# Patient Record
Sex: Male | Born: 1950 | Race: White | Hispanic: No | Marital: Married | Smoking: Never smoker
Health system: Southern US, Community
[De-identification: ages and names within clinical notes are randomized; demographics above are authoritative.]

## PROBLEM LIST (undated history)

## (undated) DIAGNOSIS — I10 Essential (primary) hypertension: Secondary | ICD-10-CM

## (undated) DIAGNOSIS — K7469 Other cirrhosis of liver: Secondary | ICD-10-CM

## (undated) DIAGNOSIS — R7303 Prediabetes: Secondary | ICD-10-CM

## (undated) DIAGNOSIS — F329 Major depressive disorder, single episode, unspecified: Secondary | ICD-10-CM

## (undated) DIAGNOSIS — D649 Anemia, unspecified: Secondary | ICD-10-CM

## (undated) DIAGNOSIS — M1990 Unspecified osteoarthritis, unspecified site: Secondary | ICD-10-CM

## (undated) DIAGNOSIS — K7460 Unspecified cirrhosis of liver: Secondary | ICD-10-CM

## (undated) DIAGNOSIS — F32A Depression, unspecified: Secondary | ICD-10-CM

## (undated) HISTORY — PX: CHOLECYSTECTOMY: SHX55

## (undated) HISTORY — PX: TRIGGER FINGER RELEASE: SHX641

## (undated) HISTORY — PX: COLON SURGERY: SHX602

## (undated) SURGERY — ARTHROSCOPY, SHOULDER
Anesthesia: Choice | Laterality: Left

---

## 1998-05-20 ENCOUNTER — Encounter: Admission: RE | Admit: 1998-05-20 | Discharge: 1998-07-05 | Payer: Self-pay | Admitting: Orthopedic Surgery

## 2001-03-27 HISTORY — PX: COLON SURGERY: SHX602

## 2002-01-07 ENCOUNTER — Encounter (INDEPENDENT_AMBULATORY_CARE_PROVIDER_SITE_OTHER): Payer: Self-pay | Admitting: Specialist

## 2002-01-07 ENCOUNTER — Inpatient Hospital Stay (HOSPITAL_COMMUNITY): Admission: RE | Admit: 2002-01-07 | Discharge: 2002-01-12 | Payer: Self-pay | Admitting: *Deleted

## 2003-01-23 ENCOUNTER — Ambulatory Visit (HOSPITAL_COMMUNITY): Admission: RE | Admit: 2003-01-23 | Discharge: 2003-01-23 | Payer: Self-pay | Admitting: Orthopedic Surgery

## 2003-01-23 ENCOUNTER — Ambulatory Visit (HOSPITAL_BASED_OUTPATIENT_CLINIC_OR_DEPARTMENT_OTHER): Admission: RE | Admit: 2003-01-23 | Discharge: 2003-01-23 | Payer: Self-pay | Admitting: Orthopedic Surgery

## 2003-03-28 HISTORY — PX: CARPAL TUNNEL RELEASE: SHX101

## 2006-01-05 ENCOUNTER — Ambulatory Visit (HOSPITAL_BASED_OUTPATIENT_CLINIC_OR_DEPARTMENT_OTHER): Admission: RE | Admit: 2006-01-05 | Discharge: 2006-01-05 | Payer: Self-pay | Admitting: Orthopedic Surgery

## 2009-04-07 ENCOUNTER — Ambulatory Visit (HOSPITAL_BASED_OUTPATIENT_CLINIC_OR_DEPARTMENT_OTHER): Admission: RE | Admit: 2009-04-07 | Discharge: 2009-04-07 | Payer: Self-pay | Admitting: Orthopedic Surgery

## 2010-03-27 HISTORY — PX: JOINT REPLACEMENT: SHX530

## 2010-03-30 LAB — BASIC METABOLIC PANEL
BUN: 15 mg/dL (ref 6–23)
CO2: 31 mEq/L (ref 19–32)
Calcium: 9.9 mg/dL (ref 8.4–10.5)
Chloride: 103 mEq/L (ref 96–112)
Creatinine, Ser: 1.19 mg/dL (ref 0.4–1.5)
GFR calc Af Amer: >60 mL/min (ref >60)
GFR calc non Af Amer: >60 mL/min (ref >60)
Glucose, Bld: 104 mg/dL — ABNORMAL HIGH (ref 70–99)
Potassium: 4.2 mEq/L (ref 3.5–5.1)
Sodium: 142 mEq/L (ref 135–145)

## 2010-03-30 LAB — CBC
HCT: 39.3 % (ref 39.0–52.0)
Hemoglobin: 12.9 g/dL — ABNORMAL LOW (ref 13.0–17.0)
MCH: 31.5 pg (ref 26.0–34.0)
MCHC: 32.8 g/dL (ref 30.0–36.0)
MCV: 96.1 fL (ref 78.0–100.0)
Platelets: 310 10*3/uL (ref 150–400)
RBC: 4.09 MIL/uL — ABNORMAL LOW (ref 4.22–5.81)
RDW: 15.3 % (ref 11.5–15.5)
WBC: 6.2 10*3/uL (ref 4.0–10.5)

## 2010-03-30 LAB — ABO/RH: ABO/RH(D): O NEG

## 2010-04-01 ENCOUNTER — Inpatient Hospital Stay (HOSPITAL_COMMUNITY)
Admission: RE | Admit: 2010-04-01 | Discharge: 2010-04-04 | Payer: Self-pay | Source: Home / Self Care | Attending: Orthopedic Surgery | Admitting: Orthopedic Surgery

## 2010-04-01 LAB — COMPREHENSIVE METABOLIC PANEL
ALT: 20 U/L (ref 0–53)
AST: 21 U/L (ref 0–37)
Albumin: 3.9 g/dL (ref 3.5–5.2)
Alkaline Phosphatase: 64 U/L (ref 39–117)
BUN: 18 mg/dL (ref 6–23)
CO2: 29 mEq/L (ref 19–32)
Calcium: 10 mg/dL (ref 8.4–10.5)
Chloride: 103 mEq/L (ref 96–112)
Creatinine, Ser: 1.14 mg/dL (ref 0.4–1.5)
GFR calc Af Amer: >60 mL/min (ref >60)
GFR calc non Af Amer: >60 mL/min (ref >60)
Glucose, Bld: 102 mg/dL — ABNORMAL HIGH (ref 70–99)
Potassium: 4.5 mEq/L (ref 3.5–5.1)
Sodium: 144 mEq/L (ref 135–145)
Total Bilirubin: 1 mg/dL (ref 0.3–1.2)
Total Protein: 6.7 g/dL (ref 6.0–8.3)

## 2010-04-01 LAB — CBC
HCT: 38.3 % — ABNORMAL LOW (ref 39.0–52.0)
HCT: 31.7 % — ABNORMAL LOW (ref 39.0–52.0)
Hemoglobin: 12.4 g/dL — ABNORMAL LOW (ref 13.0–17.0)
Hemoglobin: 10.2 g/dL — ABNORMAL LOW (ref 13.0–17.0)
MCH: 31.3 pg (ref 26.0–34.0)
MCH: 31.4 pg (ref 26.0–34.0)
MCHC: 32.4 g/dL (ref 30.0–36.0)
MCHC: 32.2 g/dL (ref 30.0–36.0)
MCV: 96.7 fL (ref 78.0–100.0)
MCV: 97.5 fL (ref 78.0–100.0)
Platelets: 326 10*3/uL (ref 150–400)
Platelets: 279 10*3/uL (ref 150–400)
RBC: 3.96 MIL/uL — ABNORMAL LOW (ref 4.22–5.81)
RBC: 3.25 MIL/uL — ABNORMAL LOW (ref 4.22–5.81)
RDW: 15.6 % — ABNORMAL HIGH (ref 11.5–15.5)
RDW: 15.7 % — ABNORMAL HIGH (ref 11.5–15.5)
WBC: 6.1 10*3/uL (ref 4.0–10.5)
WBC: 10.1 10*3/uL (ref 4.0–10.5)

## 2010-04-01 LAB — DIFFERENTIAL
Basophils Absolute: 0 10*3/uL (ref 0.0–0.1)
Basophils Relative: 1 % (ref 0–1)
Eosinophils Absolute: 0.1 10*3/uL (ref 0.0–0.7)
Eosinophils Relative: 2 % (ref 0–5)
Lymphocytes Relative: 23 % (ref 12–46)
Lymphs Abs: 1.4 10*3/uL (ref 0.7–4.0)
Monocytes Absolute: 0.7 10*3/uL (ref 0.1–1.0)
Monocytes Relative: 12 % (ref 3–12)
Neutro Abs: 3.9 10*3/uL (ref 1.7–7.7)
Neutrophils Relative %: 63 % (ref 43–77)

## 2010-04-01 LAB — PROTIME-INR
INR: 0.97 (ref 0.00–1.49)
Prothrombin Time: 13.1 seconds (ref 11.6–15.2)

## 2010-04-01 LAB — APTT: aPTT: 28 seconds (ref 24–37)

## 2010-04-11 LAB — CBC
HCT: 24.5 % — ABNORMAL LOW (ref 39.0–52.0)
HCT: 30.2 % — ABNORMAL LOW (ref 39.0–52.0)
HCT: 30.3 % — ABNORMAL LOW (ref 39.0–52.0)
Hemoglobin: 7.9 g/dL — ABNORMAL LOW (ref 13.0–17.0)
Hemoglobin: 10 g/dL — ABNORMAL LOW (ref 13.0–17.0)
Hemoglobin: 10.1 g/dL — ABNORMAL LOW (ref 13.0–17.0)
MCH: 31.7 pg (ref 26.0–34.0)
MCH: 31.9 pg (ref 26.0–34.0)
MCH: 32.1 pg (ref 26.0–34.0)
MCHC: 32.2 g/dL (ref 30.0–36.0)
MCHC: 33.1 g/dL (ref 30.0–36.0)
MCHC: 33.3 g/dL (ref 30.0–36.0)
MCV: 98.4 fL (ref 78.0–100.0)
MCV: 96.5 fL (ref 78.0–100.0)
MCV: 96.2 fL (ref 78.0–100.0)
Platelets: 224 10*3/uL (ref 150–400)
Platelets: 211 10*3/uL (ref 150–400)
Platelets: 232 10*3/uL (ref 150–400)
RBC: 2.49 MIL/uL — ABNORMAL LOW (ref 4.22–5.81)
RBC: 3.13 MIL/uL — ABNORMAL LOW (ref 4.22–5.81)
RBC: 3.15 MIL/uL — ABNORMAL LOW (ref 4.22–5.81)
RDW: 15.9 % — ABNORMAL HIGH (ref 11.5–15.5)
RDW: 15.7 % — ABNORMAL HIGH (ref 11.5–15.5)
RDW: 15.6 % — ABNORMAL HIGH (ref 11.5–15.5)
WBC: 6.8 10*3/uL (ref 4.0–10.5)
WBC: 8.1 10*3/uL (ref 4.0–10.5)
WBC: 8.8 10*3/uL (ref 4.0–10.5)

## 2010-04-11 LAB — TYPE AND SCREEN
ABO/RH(D): O NEG
Antibody Screen: NEGATIVE
Unit division: 0
Unit division: 0
Unit division: 0

## 2010-04-11 LAB — BASIC METABOLIC PANEL
BUN: 26 mg/dL — ABNORMAL HIGH (ref 6–23)
BUN: 12 mg/dL (ref 6–23)
CO2: 26 mEq/L (ref 19–32)
CO2: 30 mEq/L (ref 19–32)
Calcium: 8.2 mg/dL — ABNORMAL LOW (ref 8.4–10.5)
Calcium: 8.4 mg/dL (ref 8.4–10.5)
Chloride: 100 mEq/L (ref 96–112)
Chloride: 99 mEq/L (ref 96–112)
Creatinine, Ser: 3.03 mg/dL — ABNORMAL HIGH (ref 0.4–1.5)
Creatinine, Ser: 1.21 mg/dL (ref 0.4–1.5)
GFR calc Af Amer: 26 mL/min — ABNORMAL LOW (ref >60)
GFR calc Af Amer: >60 mL/min (ref >60)
GFR calc non Af Amer: 21 mL/min — ABNORMAL LOW (ref >60)
GFR calc non Af Amer: >60 mL/min (ref >60)
Glucose, Bld: 124 mg/dL — ABNORMAL HIGH (ref 70–99)
Glucose, Bld: 129 mg/dL — ABNORMAL HIGH (ref 70–99)
Potassium: 4.5 mEq/L (ref 3.5–5.1)
Potassium: 3.9 mEq/L (ref 3.5–5.1)
Sodium: 135 mEq/L (ref 135–145)
Sodium: 136 mEq/L (ref 135–145)

## 2010-04-11 LAB — COMPREHENSIVE METABOLIC PANEL
ALT: 22 U/L (ref 0–53)
AST: 31 U/L (ref 0–37)
Albumin: 2.8 g/dL — ABNORMAL LOW (ref 3.5–5.2)
Alkaline Phosphatase: 51 U/L (ref 39–117)
BUN: 10 mg/dL (ref 6–23)
CO2: 30 mEq/L (ref 19–32)
Calcium: 8.6 mg/dL (ref 8.4–10.5)
Chloride: 100 mEq/L (ref 96–112)
Creatinine, Ser: 0.98 mg/dL (ref 0.4–1.5)
GFR calc Af Amer: >60 mL/min (ref >60)
GFR calc non Af Amer: >60 mL/min (ref >60)
Glucose, Bld: 114 mg/dL — ABNORMAL HIGH (ref 70–99)
Potassium: 4 mEq/L (ref 3.5–5.1)
Sodium: 137 mEq/L (ref 135–145)
Total Bilirubin: 1.2 mg/dL (ref 0.3–1.2)
Total Protein: 5.9 g/dL — ABNORMAL LOW (ref 6.0–8.3)

## 2010-04-11 LAB — PROTIME-INR
INR: 1.14 (ref 0.00–1.49)
INR: 1.49 (ref 0.00–1.49)
INR: 1.66 — ABNORMAL HIGH (ref 0.00–1.49)
Prothrombin Time: 14.8 seconds (ref 11.6–15.2)
Prothrombin Time: 18.2 seconds — ABNORMAL HIGH (ref 11.6–15.2)
Prothrombin Time: 19.8 seconds — ABNORMAL HIGH (ref 11.6–15.2)

## 2010-04-11 LAB — HEPATIC FUNCTION PANEL
ALT: 22 U/L (ref 0–53)
AST: 35 U/L (ref 0–37)
Albumin: 2.6 g/dL — ABNORMAL LOW (ref 3.5–5.2)
Alkaline Phosphatase: 45 U/L (ref 39–117)
Bilirubin, Direct: 0.3 mg/dL (ref 0.0–0.3)
Indirect Bilirubin: 1.4 mg/dL — ABNORMAL HIGH (ref 0.3–0.9)
Total Bilirubin: 1.7 mg/dL — ABNORMAL HIGH (ref 0.3–1.2)
Total Protein: 5.3 g/dL — ABNORMAL LOW (ref 6.0–8.3)

## 2010-04-11 LAB — CK TOTAL AND CKMB (NOT AT ARMC)
CK, MB: 5.6 ng/mL — ABNORMAL HIGH (ref 0.3–4.0)
Relative Index: 0.6 (ref 0.0–2.5)
Total CK: 878 U/L — ABNORMAL HIGH (ref 7–232)

## 2010-04-15 NOTE — Op Note (Signed)
[name], [name]             ACCOUNT NO.:  [id number]  MEDICAL RECORD NO.:  [id number]          PATIENT TYPE:  INP  LOCATION:  5010                         FACILITY:  [facility]  PHYSICIAN:  Harvie Junior, M.D.   DATE OF BIRTH:  1951-03-03  DATE OF PROCEDURE:  04/01/2010 DATE OF DISCHARGE:                              OPERATIVE REPORT   PREOPERATIVE DIAGNOSIS:  Bilateral severe end-stage degenerative joint disease.  POSTOPERATIVE DIAGNOSIS:  Bilateral severe end-stage degenerative joint disease.  PROCEDURES: 1. Right total knee replacement with sigma system size 5 femur, size 5     tibia, 41-mm all-poly patella and a 10-mm bridging bearing. 2. Computer-assisted right total knee replacement. 3. Left total knee replacement with a sigma system size 5 femur, size     5 tibia, 10-mm bridging bearing, and a 38-mm all-poly patella. 4. Computer-assisted left total knee replacement.  SURGEON:  Harvie Junior, MD on the right knee with assistant, Marshia Ly.  Surgeon on the left knee will be Dr. Luiz Blare with assistant, Marshia Ly.  BRIEF HISTORY:  [name] is a 60 year old male with a long history of having significant bilateral degenerative joint disease.  We treated him with conservative measures including activity modification, injection therapy, viscous supplementation, arthroscopy.  He has failed all of this.  X-ray showed that he had bone-on-bone changes and end-stage degenerative joint disease.  After prolonged discussion, we talked about knee replacement as the most appropriate course of action and we talked about bilateral knee replacement.  We cautioned him all the issues related to bilateral knee replacement with an increased incidence of death, increased incidence of morbidity and mortality.  He understood the significant risk that we have taken, but ultimately because of severe disease and only wanted to go through this once, he was taken to the operating room  for bilateral knee replacement.  Preoperative medical clearance was obtained from both Cardiology and Medicine.  Because of his young age and need for absolutely perfect long alignment, we felt that we needed to use computer-assisted alignment and this was chosen to be used preoperatively and he was brought to the operating room for this.  PROCEDURE:  The patient was taken to the operating room and after adequate anesthesia was obtained with general anesthetic, the patient was placed supine on the operating table.  Both legs were then prepped and draped in usual sterile fashion after adequate anesthesia had been obtained.  At this point, the attention was turned to the right leg where this was exsanguinated.  Blood pressure tourniquet was inflated to 350 mmHg.  Following this, a midline incision was made in the subcutaneous tissue down the level of extensor mechanism.  Medial parapatellar arthrotomy was undertaken and attention was then turned to the knee where medial and lateral meniscus were excised as well as anterior and posterior cruciates, retropatellar fat pad.  Attention was then turned to placement of the computer modules, 2 pins in the tibia, 2 pins in the femur.  Registration process was taken and this adds about 30 minutes of surgical procedure.  Once this was completed, the attention was then turned to tibia, which  was cut perpendicular to the long axis.  Attention turned to the femur, which was cut perpendicular to the anatomic axis.  Space block was put in place.  This gave perfect neutral long alignment.  At this point, attention was turned to the femur, which was sized to a 5.  Anterior and posterior cuts were made as well as chamfers and box.  Attention was then turned to the tibia, which was sized to a 5 and this was drilled and keeled.  Once this was done, a 10-mm spacer bearing was put in place, computer was checked perfect neutral long alignment.  Attention was  turned to the patella, which was cut down to a level of 15 mm and put a 38 paddle along, but it seemed very small as he had an elongated patella 41 fit but it was kind of very close, but we felt that 41 would be appropriate.  There was no overhang. Lugs were drilled, trials were placed, perfect neutral long alignment. At this point, all the trials were removed, knee was copiously and thoroughly lavaged and suctioned dry, and the final components were cemented in place.  Size 5 tibia, size 5 femur, 10-mm bridging bearing trial, and a 41-mm all-poly patella.  Once the cement was allowed to harden completely, the computer was checked.  There was perfect neutral long alignment.  Computer was removed and the tourniquet was let down after cement had hardened.  There was some bleeding on the lateral side, which was coagulated and we then swapped out the trial poly and went with the final poly.  Perfect neutral long alignment, perfect range of motion, and stability.  An Autovac drain was placed and the medial parapatellar arthritis closed with 1 Vicryl running and then Marshia Ly began closure of the skin with 0 and 2-0 Vicryl and staples while we turned to the left knee.  Left knee was then exsanguinated.  Blood pressure tourniquet inflated to 350 mmHg.  Following this, a midline incision was made in the subcutaneous tissue down the level of extensor mechanism.  Medial parapatellar arthrotomy was undertaken.  Medial and lateral meniscus were removed, retropatellar fat pad, anterior and posterior cruciates. Attention turned to the completion of the computer, 2 pins were placed in the tibia, 2 pins in the femur, and the arrays were placed. Registration process was undertaken and this adds to about 30 minutes to the surgical procedure.  Once this was done, attention was turned to the tibia, which cut perpendicular to its long axis.  Attention turned to the femur, which was cut perpendicular to  the anatomic axis under computer assistance.  Following this, the femur was sized.  Anterior and posterior cuts were made as well as chamfer cuts and box.  Attention turned to the tibia, sized to a 5 and drilled and keeled.  Trials were placed with a 10-mm bridging bearing.  Perfect neutral long alignment by computer.  Attention turned to patella, cut down to a level of 14 on this side, and again paddles were placed.  A 41 paddle overhung slightly on this side.  We went to a 38 poly and this was drilled.  Once this was done, the trials were placed, put through a range of motion, perfect stability and balance.  All trials were removed.  Knee was copiously and thoroughly lavaged, suctioned dry.  Once this was done, the final components were cemented in place, size 5 femur, size 5 tibia, 10-mm bridging bearing trial, and a 38-mm all  poly patella.  Once cement was allowed to harden, tourniquet was let down, Autovac drain was placed. All bleeding was controlled with electrocautery and the final 10 poly was placed and put through a range of motion.  Stability was excellent. The medial parapatellar arthrotomy was closed with 1-Vicryl running, skin with 0 and 2-0 Vicryl, and skin staples.  Sterile compressive dressing was applied and the patient was taken to the recovery room where he was noted to be in satisfactory condition.  Autovac were placed in the right leg draining throughout the case at about 300 mL and at the time of completion of the left knee, an Autovac was placed in the left knee.  The Autovac blood be given per protocol and then we will follow his blood count following that.  The estimated blood loss for the left knee was less than 50 mL.     Harvie Junior, M.D.     Ranae Plumber  D:  04/01/2010  T:  04/02/2010  Job:  [id number]  Electronically Signed by Jodi Geralds M.D. on 04/14/2010 08:59:32 PM

## 2010-04-21 LAB — SURGICAL PCR SCREEN
MRSA, PCR: NEGATIVE
Staphylococcus aureus: NEGATIVE

## 2010-05-06 NOTE — Discharge Summary (Signed)
[name], [name]             ACCOUNT NO.:  [id number]  MEDICAL RECORD NO.:  [id number]          PATIENT TYPE:  INP  LOCATION:  5010                         FACILITY:  [facility]  PHYSICIAN:  Harvie Junior, M.D.   DATE OF BIRTH:  1950/05/31  DATE OF ADMISSION:  04/01/2010 DATE OF DISCHARGE:  04/04/2010                              DISCHARGE SUMMARY   ADMITTING DIAGNOSES: 1. End-stage bone-on-bone degenerative joint disease bilateral knees. 2. Hypertension. 3. History of depression. 4. Hypercholesterolemia.  DISCHARGE DIAGNOSES: 1. End-stage bone-on-bone degenerative joint disease bilateral knees. 2. Hypertension. 3. History of depression. 4. Acute renal failure postoperatively. 5. Acute blood loss anemia. 6. Hypercholesterolemia 7. Mild acute rhabdomyolysis.  PROCEDURES IN HOSPITAL:  Bilateral total knee replacements, Jodi Geralds MD April 01, 2010.  CONSULTATIONS IN HOSPITAL:  Internal Medicine, Dr. Boris Sharper, April 02, 2010.  BRIEF HISTORY:  [name] is a 60 year old male whom we have followed on an outpatient basis for bilateral knee pain for many years.  His weightbearing x-rays shows his bone-on-bone degenerative arthritis.  He has night pain in both of his knees and pain with ambulation.  He has had multiple cortisone injections over time, has used narcotic pain medication, we have discussed weight reduction and activity modification, but he continues to have significant pain and disability in both knees secondary to his degenerative arthritis.  He has got no relief with exhaustive conservative treatment and based upon his clinical and radiographic findings, he was admitted for bilateral total knee replacements.  PERTINENT LABORATORY STUDIES:  Ultrasound of the renal and urinary tracts on April 03, 2010, showed normal study.  Acute abdominal x-ray showed abnormal small bowel distention which maybe due to bowel obstruction or ileus, moderate retained stool  which may reflect constipation on April 02, 2010.  On April 01, 2010, chest x-ray showed asymmetric pulmonary opacity in right infrahilar region, which may be due to infiltrate, atelectasis, or scarring.  Comparison with any previous outside chest radiographs will be helpful if available.  OTHER LABORATORY STUDIES:  Hemoglobin on admission was 12.9, hematocrit 39.3, indices within normal limits.  On postop day #1, hemoglobin was 7.9 with hematocrit 24.5, two units of packed RBCs were transfused and hemoglobin was 10.0 with hematocrit of 30.2.  On April 04, 2010, hemoglobin was 10.1 with a hematocrit of 30.3.  Protime on admission was 13.1 seconds with INR of 0.97, PTT of 28.  On the day of discharge, on Coumadin therapy, his protime was 19.8 seconds and INR 1.66.  BMET on admission showed elevation of BUN and creatinine on April 02, 2010, of 26 and 3.03.  Following day, his BUN was down to the normal range as well as his creatinine.  On the date of discharge, his BUN was 10 and his creatinine was 0.98.  EGFR was greater than 60, but on postop day #1 it was only 26, it was greater than 60 on subsequent blood draws.  His renal studies were grossly within normal limits.  MRSA and staph aureus screens were negative.  HOSPITAL COURSE:  The patient was brought to the operating room where he underwent bilateral total knee replacements that  is well described in Dr. Luiz Blare' operative note.  Postoperatively, he was set up with Autovac drains for both knees.  Preoperatively, he was given IV gentamicin and Ancef.  Postop, he was given Ancef 1 g q.8 hours.  This is per protocol. He was started on Coumadin antithrombotic therapy per pharmacy protocol for DVT prophylaxis.  Physical therapy was ordered for walker, ambulation, weightbearing as tolerated bilaterally.  PCA morphine pump was used for pain control.  On postop day #1, the patient complained of significant knee pain.  He was taking p.o.  fluids well.  He denied chest pain or shortness of breath.  He had a BUN of 26 and a creatinine of 3.0.  He had an INR of 1.14.  His hemoglobin was 7.9.  He was felt to have acute blood loss anemia and acute renal failure.  He was hypotensive as well we increased his IV fluid rate to 125 mL/hour.  He was given a fluid bolus 2 units of packed RBCs autologous blood were transfused.  He was continued with the Foley x1 more day for management of his Is and Os.  Medical consult was obtained per [facility] to manage his acute renal failure.  This was appreciated.  On postop day #2, the patient was feeling better.  He had moderate bilateral knee pain.  He was taking fluids without difficulty and had good urinary output.  His hemoglobin was 10.0.  His BUN was 12 and creatinine 1.21. Potassium was 3.9.  INR was 1.49.  His dressings were changed and his Hemovac drains were discontinued.  His Foley was discontinued.  His PCA morphine pump was discontinued.  His IV was converted to a saline lock. He was started on oral pain medication.  He was ambulating with a knee mobilizer bilaterally with a walker with physical therapy.  He was continued on oral Coumadin.  On postop day #3, the patient was doing well.  He had appropriate knee pain.  His vital signs were stable.  He is afebrile.  His INR was 1.66.  His BUN was 10.  His creatinine was 0.98.  His hemoglobin was 10.1.  His bilateral knee dressings were changed.  His wounds were benign.  His calves were soft and nontender. He had good early range of motion and knee was intact distally.  The patient was discharged home in improved condition.  He will be on a regular diet.  He is given a Rx for, 1. Percocet 5 mg one to two q.4-6 h. p.r.n. severe pain. 2. OxyContin 20 mg one b.i.d. 3. Robaxin 750 mg one q.8 h. p.r.n. spasm. 4. Coumadin per pharmacy protocol one daily unless otherwise directed     x1 month postop.  He will also continue on  his usual home medications of, 1. Propranolol 80 mg one daily at bedtime. 2. Simvastatin 40 mg one daily at bedtime. 3. Iron 65 mg one daily at bedtime. 4. Bupropion XL 300 mg one daily at bedtime. 5. Benicar HCT 40/25 mg one daily.  He will use home CPM machine and will need home health physical therapy for walker ambulation and weightbearing as tolerated bilaterally, and home health nurse for protimes, and Coumadin management.  He will follow up with Dr. Luiz Blare in the office in 10 days, sooner if any problems occur.  We would like him to follow up with his medical doctor.  I will discuss this with the patient regarding the asymmetric pulmonary opacity in his right infrahilar region felt to  be due to infiltrate, atelectasis, or scarring for comparison of any previous outside chest radiographs.  I will have the patient contact Dr. Dory Peru at [facility] in [facility] regarding this.     Marshia Ly, P.A.   ______________________________ Harvie Junior, M.D.    JB/MEDQ  D:  04/21/2010  T:  04/22/2010  Job:  [id number]  cc:   Jodell Cipro  Electronically Signed by Marshia Ly P.A. on 05/04/2010 10:31:05 AM Electronically Signed by Jodi Geralds M.D. on 05/06/2010 03:56:02 PM

## 2010-06-12 LAB — BASIC METABOLIC PANEL
BUN: 28 mg/dL — ABNORMAL HIGH (ref 6–23)
Calcium: 9.5 mg/dL (ref 8.4–10.5)
GFR calc non Af Amer: >60 mL/min (ref >60)
Glucose, Bld: 100 mg/dL — ABNORMAL HIGH (ref 70–99)
Potassium: 4.4 mEq/L (ref 3.5–5.1)

## 2010-08-12 NOTE — Op Note (Signed)
NAMEJAKEVION, ARNEY                         ACCOUNT NO.:  [id number]   MEDICAL RECORD NO.:  [id number]                   PATIENT TYPE:  AMB   LOCATION:  DSC                                  FACILITY:  [facility]   PHYSICIAN:  Harvie Junior, M.D.                DATE OF BIRTH:  09-Aug-1950   DATE OF PROCEDURE:  01/23/2003  DATE OF DISCHARGE:                                 OPERATIVE REPORT   PREOPERATIVE DIAGNOSIS:  Medial side knee pain.   POSTOPERATIVE DIAGNOSES:  1. Medial side knee pain with grade 3 free flap of cartilage on the anterior     medial femoral condyle.  2. Partial undersurface posterior  horn medial meniscal tear.   PROCEDURE:  1. Debridement of anterior medial femoral condyle defect with micro fracture     technique  with the awl.  2. Partial posterior  horn medial meniscectomy with debridement of     corresponding area of cartilage injury on the medial femoral condyle on     the weightbearing area.   SURGEON:  Harvie Junior, M.D.   ASSISTANT:  Marshia Ly, P.A.   ANESTHESIA:  General.   INDICATIONS FOR PROCEDURE:  He is a 60 year old male with a long history of  having medial side knee pain. He ultimately had undergone conservative care  including  injection therapy, and because of continued complaints of pain he  was taken to the operating room for operative knee arthroscopy.   DESCRIPTION OF PROCEDURE:  The patient was brought to the operating room and  after adequate anesthesia was obtained with a general anesthetic the patient  was placed on the operating table. The left leg was prepped and draped in  the usual sterile fashion.   Following  this routine arthroscopic examination of the knee revealed  there  was an obvious condylar defect on the medial femoral condyle anteriorly  under the patellar facet. This was debrided with a suction shaver back to a  smooth and stable rim.   Attention was turned to the medial area. There was no plica  identified. A  small  undersurface tear of the posterior  horn of the medial meniscus was  identified as well as some corresponding grade 2 changes on the medial  femoral condyle on the weightbearing area. The meniscal tear was debrided as  well as the area of the medial femoral condyle.   Attention was turned to the anterior cruciate which was normal. Attention  was turned laterally which was normal. Attention was turned back to the  trochlear defect medially. This was debrided. A ring curet was used to  smooth the edges of this defect and this defect was then drilled with an awl  to allow for vascular ingrowth.   At this point the knee was copiously irrigated and suctioned dry. The  instrument portals were closed with a bandage. A sterile compressive  dressing  was applied.   The patient was taken to the recovery room and noted to be in satisfactory  condition. Estimated blood loss for the procedure was minimal.                                               Harvie Junior, M.D.    Ranae Plumber  D:  01/23/2003  T:  01/23/2003  Job:  [id number]

## 2010-08-12 NOTE — Op Note (Signed)
[name], [name]             ACCOUNT NO.:  [id number]   MEDICAL RECORD NO.:  [id number]          PATIENT TYPE:  AMB   LOCATION:  DSC                          FACILITY:  [facility]   PHYSICIAN:  Harvie Junior, M.D.   DATE OF BIRTH:  11/03/1950   DATE OF PROCEDURE:  01/05/2006  DATE OF DISCHARGE:                                 OPERATIVE REPORT   PREOPERATIVE DIAGNOSIS:  Lateral epicondylitis with failure of conservative  care.   POSTOPERATIVE DIAGNOSIS:  Lateral epicondylitis with failure of conservative  care.   PRINCIPLE PROCEDURES:  1. Lateral epicondylar release by way of [facility] procedure.  2. Irrigation and debridement of the elbow joint.   SURGEON:  Harvie Junior, M.D.   ASSISTANT:  Marshia Ly, P.A.   ANESTHESIA:  General.   BRIEF HISTORY:  Mr. [name] is a 60 year old male with a long history of  having right lateral elbow pain.  We had treated him conservatively for a  long period of time: activity modification, exercise activities and  injection therapy, as well as physical therapy.  All this failed, and  because of continuing complaints of pain and lateral elbow pain, it was  elected to take him to the operating room for a lateral epicondylar release.   PROCEDURE:  The patient was taken to the operating room, and after an  adequate level of anesthesia was obtained with a general anesthetic, the  patient was placed on the operating table and the right elbow was then  prepped and draped in the usual sterile fashion.  Following this, a curved  incision was made over the posterior aspect of the elbow and the  subcutaneous tissues taken down to the level of the lateral epicondyle.  The  lateral epicondylar tissue was then divided over the prominent lateral  epicondylar area; and, lo and behold, the extensor carpi radialis brevis was  completely evulsed from its insertion on the lateral epicondyle.  This  tissue was gelatinous and the fibers were completely  released.  We debrided  these fibers, which gave access into the radiocapitellar joint, which was  evaluated thoroughly and lavaged thoroughly.  There was no evidence of  arthritic change or loose body or other abnormality.  Once the origin of the  detached portion of the brevis was debrided, the remaining portions of the  longus and the other remaining portions of the extensor group were  evaluated.  There certainly appeared to be enough tendon that it would work  fine, and at that point, the lateral epicondylar area was rongeured  thoroughly until a good bed of bleeding bone was achieved, and once that was  achieved, the lateral epicondylar group was then laid back down on the  lateral epicondyle.  Excellent fixation was achieved in this area, and the  tendon was allowed to attach right down to bone at this point.  Once that  was accomplished, the wound was copiously irrigated and suctioned dry.  The  layer had been closed with a 1 Vicryl running suture to allow this muscular  layer to attach back to the lateral epicondyle.  The  skin was then closed  with 2-0 Vicryl and a 3-0 Maxon pull-out suture.  Benzoin and Steri-Strips  were applied.  A sterile compressive dressing was applied, as well as a  posterior plaster, and the patient was taken to the recovery room, where he  was noted to be in satisfactory condition.  The estimated blood loss for the  procedure was none.  Postoperatively, this patient will need a little bit more of prolonged  immobilization than typical, only because of the concerns of the brevis,  which gives greater concern for possible rupture of this area.  I discussed  this with his wife, and will also rediscuss this with him in the office.      Harvie Junior, M.D.  Electronically Signed     JLG/MEDQ  D:  01/05/2006  T:  01/05/2006  Job:  [id number]

## 2010-08-12 NOTE — Discharge Summary (Signed)
   NAMEMARTEN, [name]                         ACCOUNT NO.:  [id number]   MEDICAL RECORD NO.:  [id number]                   PATIENT TYPE:  INP   LOCATION:  0454                                 [facility]ACILITY[facility]   PHYSICIAN:  Vikki Ports, M.D.         DATE OF BIRTH:  1950/08/29   DATE OF ADMISSION:  01/07/2002  DATE OF DISCHARGE:  01/12/2002                                 DISCHARGE SUMMARY   ADMISSION DIAGNOSES:  Sigmoid diverticulosis.   DISCHARGE DIAGNOSES:  Sigmoid diverticulosis.   PROCEDURE:  Laparoscopic sigmoid colectomy, hand assisted.   DISCHARGE MEDICATIONS:  Vicodin for pain.  Fiorinal p.r.n. for headaches.   FOLLOW UP:  With me two weeks.   CONDITION ON DISCHARGE:  Good and improved.   HISTORY OF PRESENT ILLNESS:  The patient is a 60 year old white male who has  had at least four episodes of diverticulitis within the last eight months  all treated with p.o. antibiotics.  The patient has had CT scan verifying  the presence of diverticulitis.  He has never had evidence of perforation.  The patient presents now after home bowel prep for laparoscopic hand  assisted sigmoid colectomy.   HOSPITAL COURSE:  The patient was admitted, taken to the operating room  where he underwent laparoscopic hand assisted sigmoid colectomy without  complications.  Postoperatively patient did well.  Was maintained n.p.o.  with IV fluids.  Foley catheter was removed on postoperative day number one.  Had to be replaced on postoperative day number two because of inability to  void.  Over the next three to four days patient began passing gas and by  postoperative day number five had had a small bowel movement.  His diets had  been advanced to a regular diet which he was tolerating well and he was  discharged to home.                                               Vikki Ports, M.D.    KRH/MEDQ  D:  02/07/2002  T:  02/07/2002  Job:  [id number]

## 2010-08-12 NOTE — Op Note (Signed)
NAMEABDULRAHIM, SIDDIQI                         ACCOUNT NO.:  [id number]   MEDICAL RECORD NO.:  [id number]                   PATIENT TYPE:  INP   LOCATION:  H846                                 FACILITY:  [facility]   PHYSICIAN:  Vikki Ports, M.D.         DATE OF BIRTH:  03-16-1951   DATE OF PROCEDURE:  01/07/2002  DATE OF DISCHARGE:                                 OPERATIVE REPORT   PREOPERATIVE DIAGNOSIS:  Sigmoid diverticulitis.   POSTOPERATIVE DIAGNOSIS:  Sigmoid diverticulitis.   PROCEDURE:  Laparoscopic hand-assisted sigmoid colectomy with splenic  flexure mobilization.   SURGEON:  Vikki Ports, M.D.   ASSISTANT:  Adolph Pollack, M.D.   ANESTHESIA:  General.   DESCRIPTION OF PROCEDURE:  The patient was taken to the operating room and  placed in a supine position.  After adequate anesthesia was induced using  endotracheal tube, the abdomen was prepped and draped in the normal sterile  fashion.  Using a small vertical supraumbilical incision about 1 cm in  length, I dissected down to the fascia.  Fascia was opened vertically.  An 0  Vicryl pursestring suture was placed around the fascial defect.  Pneumoperitoneum was obtained.  Under direct visualization, a 10 mm port was  placed in the midline and the infraumbilical region, and a 5 mm trocar was  placed in the right mid abdomen, and a second 5 mm trocar was placed in the  left lower quadrant.  Sigmoid colon was identified, was rather adherent to  the lateral sidewall lateral to the iliac vessels.  This was mobilized using  the harmonic scalpel.  The colon was mobilized in both proximal and distal  directions to the spleen.  This made the proximal colon much more flexible.  There was a significant amount of fat encompassing the majority of the  colon, but the thickened segment was only about 5-6 inches in length.  I  felt I had adequate mobilization both proximally and distally.  A 6 cm  incision was  made in the infraumbilical midline, and traction and  countertraction was accomplished in the hand-assisted fashion to further  mobilize the descending colon.  At this point, the proximal colon and the  thickened segment were brought out through the small incision, and the GIA  stapling device was used to divide the distal segment of bowel distal to the  disease.  Mesentery was taken down between [facility] clamps, and the proximal  bowel was divided using a knife.  A 2-0 Prolene pursestring suture was  placed around the proximal colotomy and using the EEA dilators, I opted for  a 29 [place] EEA.  The anvil was placed up and into the proximal colon, and  the pursestring was sutured down.  The EEA was then inserted in through the  rectum and brought out anterior to the staple line distally.  This could be  visualized.  The anvil was then attached to the  EEA stapling device, and it  was fired.  Of note, my pursestring suture tags did not get cut with the EEA  stapler, and the proximal doughnut was not completely intact.  The pelvis  was irrigated with 500 cc of saline, and rigid sigmoidoscopy was performed.  This demonstrated bubbles, and therefore the incision was extended  inferiorly.  The anastomosis was identified.  About a 5-6 mm defect could be  seen in the anterior and lateral sidewall.  This was closed with interrupted  2-0 silk sutures.  I was satisfied with the closure.  Again, the pelvis was  filled with fluid.  Proximal bowel was clamped.  The anastomosis proximally  and distally were quite distended with  the rigid sigmoidoscope.  No evidence of leak was seen.  The abdomen again  was copiously irrigated.  The fascia was closed with a running #1 Novofil.  Skin incisions were closed with staples.  The patient tolerated the  procedure well and went to PACU in good condition.                                               Vikki Ports, M.D.    KRH/MEDQ  D:  01/07/2002  T:   01/07/2002  Job:  [id number]   cc:   Dr. Delfin Edis [facility]

## 2012-02-08 IMAGING — CR DG ABDOMEN ACUTE W/ 1V CHEST
2 series · 2 of 2 positions shown · non-contrast
Comparison: 04/01/2010

CLINICAL DATA: Abdominal distention.

ACUTE ABDOMEN SERIES (ABDOMEN 2 VIEW & CHEST 1 VIEW)

[view not recorded (1 of 2)]
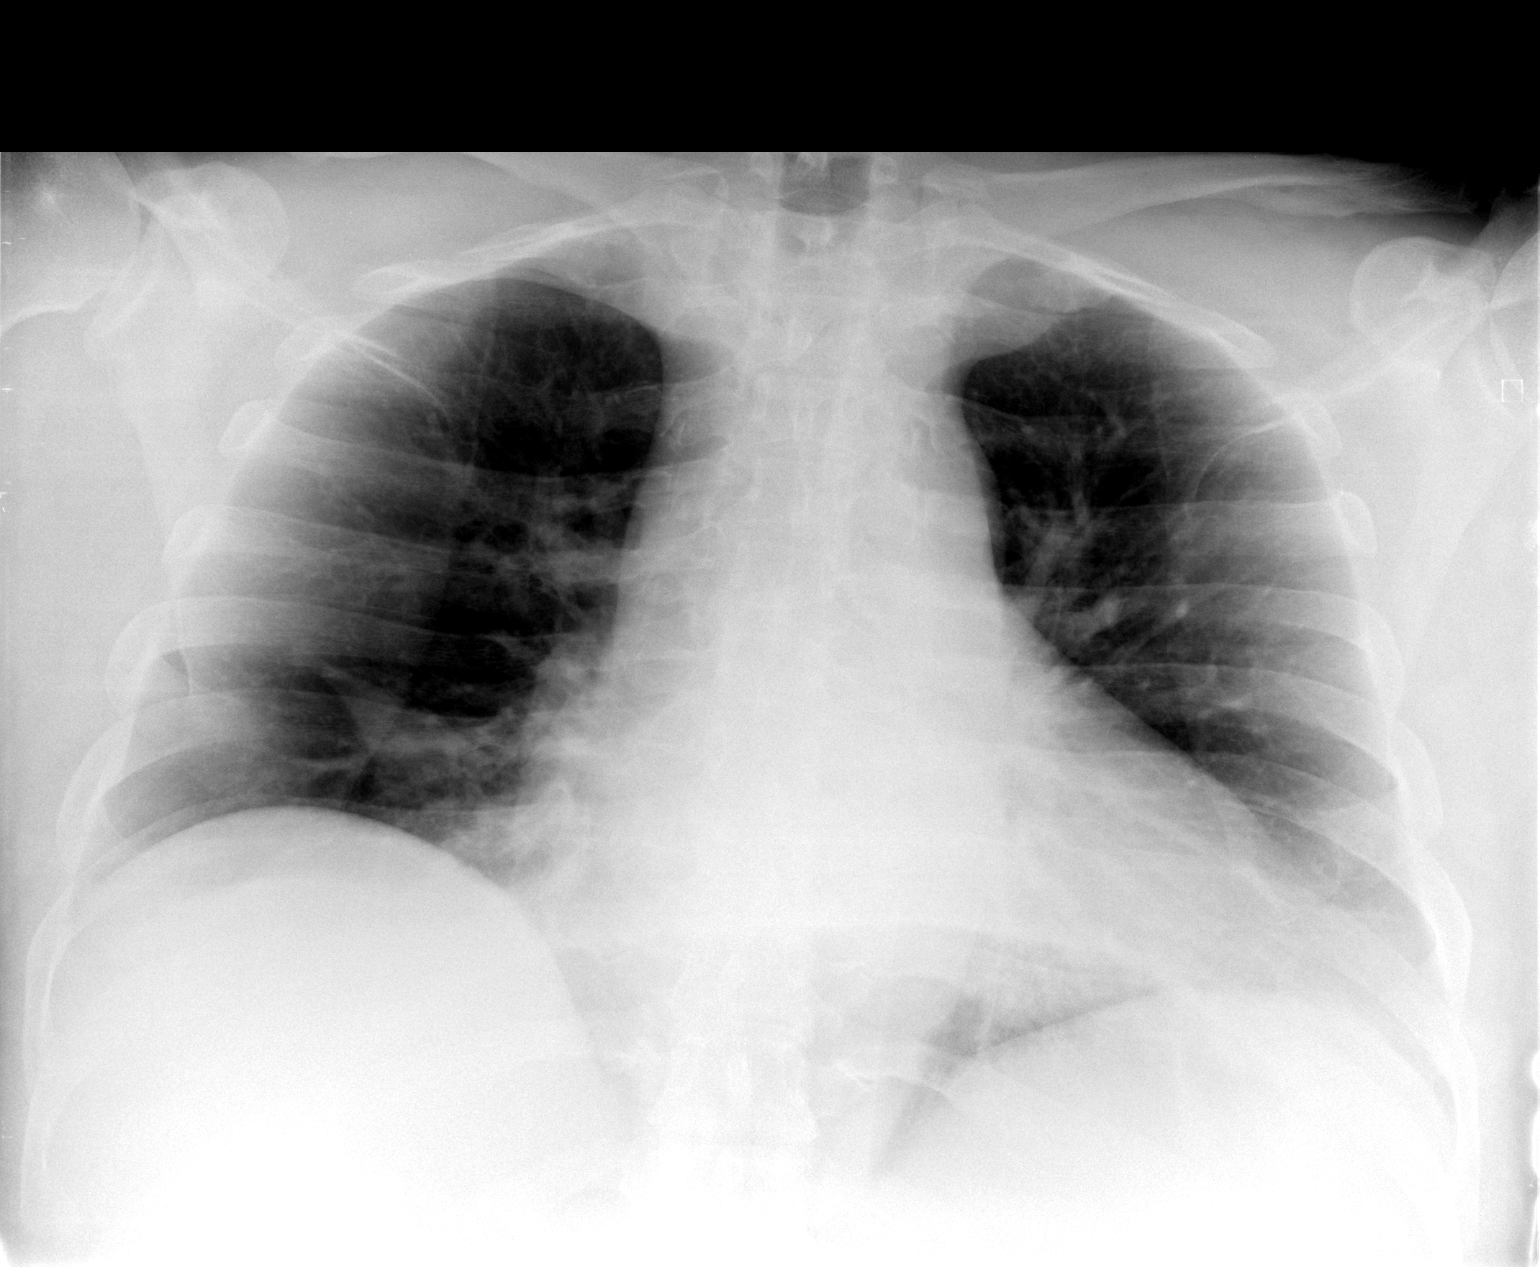

[view not recorded (2 of 2)]
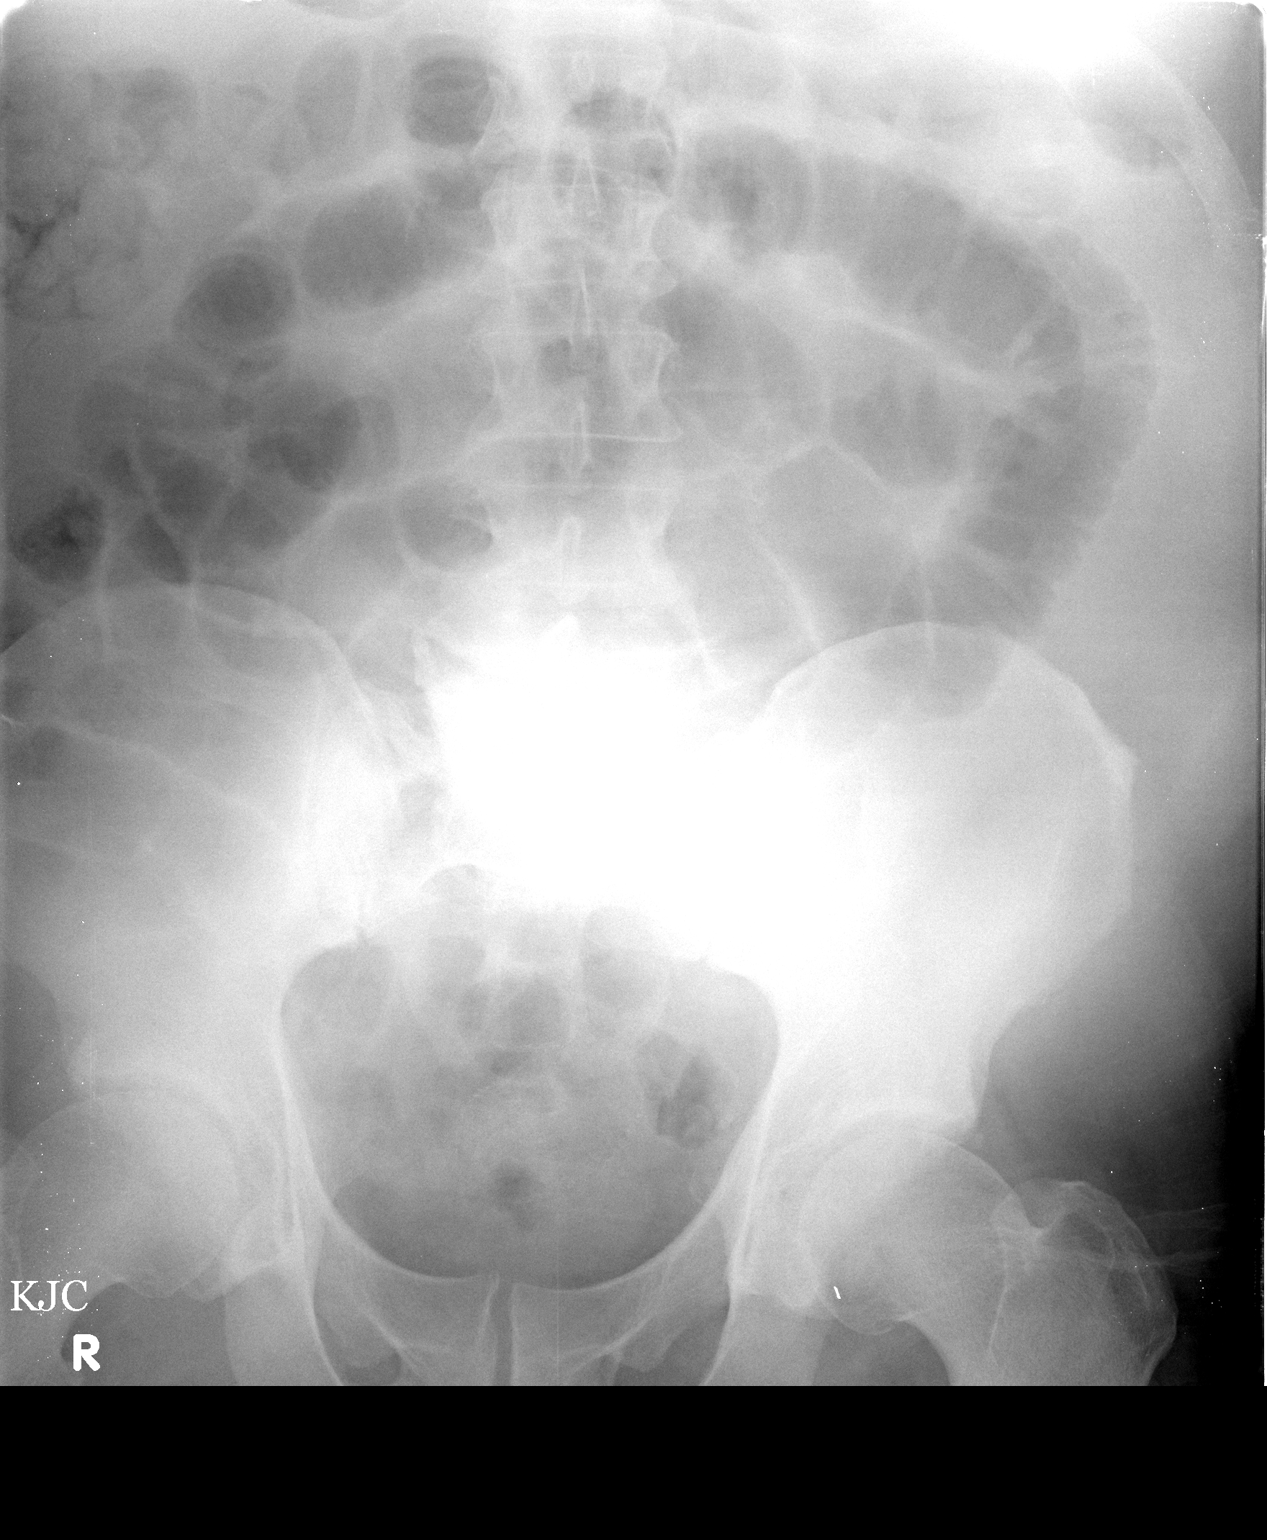

[2 of 2 positions shown; findings below may reference images not displayed]

FINDINGS: The heart size appears enlarged.  No pleural effusions or
pulmonary edema.  Right lung base opacity is unchanged from
04/01/2010.  The lung volumes are low.  There is a moderate amount
of desiccated stool identified throughout the colon.  Multiple
dilated loops of small bowel are noted which measure up to 4.3 cm.
There are no fluid levels identified on the decubitus radiographs.
IMPRESSION: 1.  Abnormal small bowel distention which may be due to bowel
obstruction or ileus.
2.  Moderate retained stool which may reflect constipation.

## 2014-08-21 ENCOUNTER — Telehealth: Payer: Self-pay | Admitting: *Deleted

## 2014-08-21 NOTE — Telephone Encounter (Signed)
Pt signed ROI request received via mail from [employer]. Forwarded to Jordan to scan/email to medical records. JG//CMA  

## 2015-06-21 ENCOUNTER — Other Ambulatory Visit: Payer: Self-pay | Admitting: Orthopedic Surgery

## 2015-06-22 NOTE — Pre-Procedure Instructions (Addendum)
[name]  06/22/2015      [facility] [id number] - [place], [place] - [address] [place] [id number] Phone: [phone] Fax: [phone]    Your procedure is scheduled on March 31  [facility] at 630 A.M.  Call this number if you have problems the morning of surgery:  [phone]   Remember:  Do not eat food or drink liquids after midnight.  Take these medicines the morning of surgery with A SIP OF WATER Acetaminophen(Tylenol) if needed, Bupropion (Wellbutrin XL), Lorazepam (Ativan) if needed  Stop taking aspirin, BC's, Goody's, Herbal medications, Fish Oil, Aleve, Ibuprofen, Advil, Motrin,Vitamins today   Do not wear jewelry, make-up or nail polish.  Do not wear lotions, powders, or perfumes.  You may wear deodorant.  Do not shave 48 hours prior to surgery.  Men may shave face and neck.  Do not bring valuables to the hospital.  [facility] is not responsible for any belongings or valuables.  Contacts, dentures or bridgework may not be worn into surgery.  Leave your suitcase in the car.  After surgery it may be brought to your room.  For patients admitted to the hospital, discharge time will be determined by your treatment team.  Patients discharged the day of surgery will not be allowed to drive home.    Special instructions:  [facility] - Preparing for Surgery  Before surgery, you can play an important role.  Because skin is not sterile, your skin needs to be as free of germs as possible.  You can reduce the number of germs on you skin by washing with CHG (chlorahexidine gluconate) soap before surgery.  CHG is an antiseptic cleaner which kills germs and bonds with the skin to continue killing germs even after washing.  Please DO NOT use if you have an allergy to CHG or antibacterial soaps.  If your skin becomes reddened/irritated stop using the CHG and inform your nurse  when you arrive at Short Stay.  Do not shave (including legs and underarms) for at least 48 hours prior to the first CHG shower.  You may shave your face.  Please follow these instructions carefully:   1.  Shower with CHG Soap the night before surgery and the                                morning of Surgery.  2.  If you choose to wash your hair, wash your hair first as usual with your       normal shampoo.  3.  After you shampoo, rinse your hair and body thoroughly to remove the                      Shampoo.  4.  Use CHG as you would any other liquid soap.  You can apply chg directly       to the skin and wash gently with scrungie or a clean washcloth.  5.  Apply the CHG Soap to your body ONLY FROM THE NECK DOWN.        Do not use on open wounds or open sores.  Avoid contact with your eyes,       ears, mouth and genitals (private parts).  Wash genitals (private parts)       with your  normal soap.  6.  Wash thoroughly, paying special attention to the area where your surgery        will be performed.  7.  Thoroughly rinse your body with warm water from the neck down.  8.  DO NOT shower/wash with your normal soap after using and rinsing off       the CHG Soap.  9.  Pat yourself dry with a clean towel.            10.  Wear clean pajamas.            11.  Place clean sheets on your bed the night of your first shower and do not        sleep with pets.  Day of Surgery  Do not apply any lotions/deoderants the morning of surgery.  Please wear clean clothes to the hospital/surgery center.     Please read over the following fact sheets that you were given. Pain Booklet, Coughing and Deep Breathing and Surgical Site Infection Prevention

## 2015-06-23 ENCOUNTER — Encounter (HOSPITAL_COMMUNITY)
Admission: RE | Admit: 2015-06-23 | Discharge: 2015-06-23 | Disposition: A | Discharge status: Home / Self Care | Payer: No Typology Code available for payment source | Source: Ambulatory Visit | Attending: Orthopedic Surgery | Admitting: Orthopedic Surgery

## 2015-06-23 ENCOUNTER — Encounter (HOSPITAL_COMMUNITY): Payer: Self-pay

## 2015-06-23 DIAGNOSIS — Z79899 Other long term (current) drug therapy: Secondary | ICD-10-CM | POA: Insufficient documentation

## 2015-06-23 DIAGNOSIS — I4510 Unspecified right bundle-branch block: Secondary | ICD-10-CM | POA: Diagnosis not present

## 2015-06-23 DIAGNOSIS — Z96653 Presence of artificial knee joint, bilateral: Secondary | ICD-10-CM | POA: Insufficient documentation

## 2015-06-23 DIAGNOSIS — Z01812 Encounter for preprocedural laboratory examination: Secondary | ICD-10-CM | POA: Diagnosis not present

## 2015-06-23 DIAGNOSIS — M7592 Shoulder lesion, unspecified, left shoulder: Secondary | ICD-10-CM | POA: Insufficient documentation

## 2015-06-23 DIAGNOSIS — Z01818 Encounter for other preprocedural examination: Secondary | ICD-10-CM | POA: Diagnosis not present

## 2015-06-23 DIAGNOSIS — F329 Major depressive disorder, single episode, unspecified: Secondary | ICD-10-CM | POA: Insufficient documentation

## 2015-06-23 DIAGNOSIS — I10 Essential (primary) hypertension: Secondary | ICD-10-CM | POA: Insufficient documentation

## 2015-06-23 HISTORY — DX: Depression, unspecified: F32.A

## 2015-06-23 HISTORY — DX: Essential (primary) hypertension: I10

## 2015-06-23 HISTORY — DX: Major depressive disorder, single episode, unspecified: F32.9

## 2015-06-23 LAB — BASIC METABOLIC PANEL
ANION GAP: 9 (ref 5–15)
BUN: 22 mg/dL — AB (ref 6–20)
CALCIUM: 9.8 mg/dL (ref 8.9–10.3)
CO2: 26 mmol/L (ref 22–32)
Chloride: 105 mmol/L (ref 101–111)
Creatinine, Ser: 0.95 mg/dL (ref 0.61–1.24)
GFR calc Af Amer: >60 mL/min (ref >60)
GLUCOSE: 103 mg/dL — AB (ref 65–99)
POTASSIUM: 4 mmol/L (ref 3.5–5.1)
Sodium: 140 mmol/L (ref 135–145)

## 2015-06-23 LAB — CBC
HEMATOCRIT: 47.2 % (ref 39.0–52.0)
HEMOGLOBIN: 16 g/dL (ref 13.0–17.0)
MCH: 31.8 pg (ref 26.0–34.0)
MCHC: 33.9 g/dL (ref 30.0–36.0)
MCV: 93.8 fL (ref 78.0–100.0)
Platelets: 191 10*3/uL (ref 150–400)
RBC: 5.03 MIL/uL (ref 4.22–5.81)
RDW: 14.8 % (ref 11.5–15.5)
WBC: 8.7 10*3/uL (ref 4.0–10.5)

## 2015-06-23 NOTE — Progress Notes (Signed)
Message left for nicole at dr graves office About ancef order , patient is allergic to pcn

## 2015-06-24 ENCOUNTER — Encounter (HOSPITAL_BASED_OUTPATIENT_CLINIC_OR_DEPARTMENT_OTHER): Payer: Self-pay | Admitting: *Deleted

## 2015-06-24 NOTE — H&P (Addendum)
PREOPERATIVE H&P  Chief Complaint: left shoulder pain  HPI: [name] is a 65 y.o. male who presents for evaluation of left shoulder pain. It has been present for many months and has been worsening. He has failed conservative measures.the patient had an MRI scan which shows that he has a high-grade partial or near full-thickness tear of the supraspinatus and some tearing of infraspinatus. Pain is rated as moderate.  Past Medical History  Diagnosis Date  . Hypertension   . Depression    Past Surgical History  Procedure Laterality Date  . 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 Colon surgery  2003    diverticulitis  . Carpal tunnel release Bilateral 2005   Social History   Social History  . Marital Status: Married    Spouse Name: N/A  . Number of Children: N/A  . Years of Education: N/A   Social History Main Topics  . Smoking status: Never Smoker   . Smokeless tobacco: None  . Alcohol Use: No  . Drug Use: No  . Sexual Activity: Not Asked   Other Topics Concern  . None   Social History Narrative   History reviewed. No pertinent family history. Allergies  Allergen Reactions  . Penicillins Hives   Prior to Admission medications   Medication Sig Start Date End Date Taking? Authorizing Provider  acetaminophen (TYLENOL) 325 MG tablet Take 650 mg by mouth every 6 (six) hours as needed for mild pain or headache.   Yes Historical Provider, MD  Aspirin-Acetaminophen-Caffeine (GOODY HEADACHE PO) Take 1 packet by mouth every 8 (eight) hours as needed (headache).   Yes Historical Provider, MD  buPROPion (WELLBUTRIN XL) 300 MG 24 hr tablet Take 300 mg by mouth daily.   Yes Historical Provider, MD  lisinopril-hydrochlorothiazide (PRINZIDE,ZESTORETIC) 10-12.5 MG tablet Take 1 tablet by mouth daily.   Yes Historical Provider, MD  LORazepam (ATIVAN) 1 MG tablet Take 1 mg by mouth every 8 (eight) hours as needed for anxiety.   Yes Historical Provider, MD     Positive  ROS: none  All other systems have been reviewed and were otherwise negative with the exception of those mentioned in the HPI and as above.  Physical Exam: Filed Vitals:   06/25/15 0829 06/25/15 0830  BP:    Pulse: 84 85  Temp:    Resp: 19 12    General: Alert, no acute distress Cardiovascular: No pedal edema Respiratory: No cyanosis, no use of accessory musculature GI: No organomegaly, abdomen is soft and non-tender Skin: No lesions in the area of chief complaint Neurologic: Sensation intact distally Psychiatric: Patient is competent for consent with normal mood and affect Lymphatic: No axillary or cervical lymphadenopathy  MUSCULOSKELETAL: left shoulder: Painful range of motion.  Weakness next rotation.  Grinding the range of motion.  MRI: MRI shows significant fraying to the biceps tendon, what I believe is a full-thickness rotator cuff tear of the supraspinatus but which is read as a partial thickness tear.  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 Joint arthritis.  Plan for Procedure(s): ARTHROSCOPY SHOULDER, SCD, DCR, probable rotator cuff repair, possible biceps tenodesis versus resection  The risks benefits and alternatives were discussed with the patient including but not limited to the risks of nonoperative treatment, versus surgical intervention including infection, bleeding, nerve injury, malunion, nonunion, hardware prominence, hardware failure, need for hardware removal, blood clots, cardiopulmonary complications, morbidity, mortality, among others, and they were willing to proceed.  Predicted outcome is good, although there will be at least a six to nine month expected recovery.  Thorne Wirz L, MD 06/25/2015 8:36 AM

## 2015-06-24 NOTE — Anesthesia Preprocedure Evaluation (Addendum)
Anesthesia Evaluation  Patient identified by MRN, date of birth, ID band Patient awake    Reviewed: Allergy & Precautions, H&P , NPO status , Patient's Chart, lab work & pertinent test results  Airway Mallampati: II  TM Distance: >3 FB Neck ROM: full    Dental no notable dental hx. (+) Dental Advisory Given, Teeth Intact   Pulmonary neg pulmonary ROS,    Pulmonary exam normal breath sounds clear to auscultation       Cardiovascular Exercise Tolerance: Good hypertension, Pt. on medications Normal cardiovascular exam Rhythm:regular Rate:Normal     Neuro/Psych negative neurological ROS  negative psych ROS   GI/Hepatic negative GI ROS, Neg liver ROS,   Endo/Other  negative endocrine ROSMorbid obesity  Renal/GU negative Renal ROS  negative genitourinary   Musculoskeletal   Abdominal (+) + obese,   Peds  Hematology negative hematology ROS (+)   Anesthesia Other Findings   Reproductive/Obstetrics negative OB ROS                          Anesthesia Physical Anesthesia Plan  ASA: III  Anesthesia Plan: General   Post-op Pain Management: GA combined w/ Regional for post-op pain   Induction: Intravenous  Airway Management Planned: Oral ETT  Additional Equipment:   Intra-op Plan:   Post-operative Plan: Extubation in OR  Informed Consent: I have reviewed the patients History and Physical, chart, labs and discussed the procedure including the risks, benefits and alternatives for the proposed anesthesia with the patient or authorized representative who has indicated his/her understanding and acceptance.   Dental Advisory Given  Plan Discussed with: CRNA and Surgeon  Anesthesia Plan Comments:      Anesthesia Quick Evaluation

## 2015-06-24 NOTE — Progress Notes (Signed)
Anesthesia Chart Review: Patient is a 65 year old male scheduled for left shoulder arthroscopy on 06/25/15 by Dr. Luiz BlareGraves.  History includes non-smoker, HTN, depression, diverticulitis s/p colon surgery '03, bilateral TKA '12. BMI is consistent with obesity. PCP is listed as Biomedical engineerManharprett Sekhon. He was evaluated by cardiologist Dr. Arloa KohMei-Yu "Elwin MochaEric" Chuang with [facility] in 2011/2012 for preoperative clearance prior to undergoing TKA .  Meds include Wellbutrin XL, lisinopril-HCTZ, Ativan.  06/23/15 EKG: SR with first degree AVB, LAE, incomplete right BBB.  03/14/10 Nuclear stress test [facility]): No focal fixed or reversible defects are identified to suggest infarction or exercise-induced ischemia. EF 61%. No specific wall motion abnormality.   02/26/10 Echo [facility]): LV size is normal. Systolic function is probably normal. Estimated EF is about 70%. No obvious regional wall motion abnormality. Mild to moderate LVH with mild diastolic dysfunction. Right ventricle is mildly enlarged with preserved function. No obvious RVH. The atrium is mildly enlarged. The rest of the chamber sizes appear normal. No obvious pericardial effusion nor intra-chamber thrombus. Aortic root appears normal. Aortic valve is trileaflet. No stenosis or regurgitation. Mitral, tricuspid, and pulmonic valves appear normal. No obvious dysfunction. Pulmonic pressure cannot be estimated in the study. No evidence of fluid overload. No LVOT obstruction, mitral valve prolapse, ASD nor VSD.  Preoperative labs noted.   If no acute changes then I anticipate that he can proceed as planned.  Velna Ochsllison Aithan Farrelly, PA-C [facility]An[facility]iology Phone [phone] 06/24/2015 10:17 AM

## 2015-06-25 ENCOUNTER — Encounter (HOSPITAL_BASED_OUTPATIENT_CLINIC_OR_DEPARTMENT_OTHER): Payer: Self-pay | Admitting: Certified Registered"

## 2015-06-25 ENCOUNTER — Inpatient Hospital Stay (HOSPITAL_BASED_OUTPATIENT_CLINIC_OR_DEPARTMENT_OTHER): Payer: No Typology Code available for payment source | Admitting: Anesthesiology

## 2015-06-25 ENCOUNTER — Encounter (HOSPITAL_BASED_OUTPATIENT_CLINIC_OR_DEPARTMENT_OTHER)
Admission: RE | Disposition: A | Discharge status: Home / Self Care | Payer: Self-pay | Source: Ambulatory Visit | Attending: Orthopedic Surgery

## 2015-06-25 ENCOUNTER — Ambulatory Visit (HOSPITAL_COMMUNITY)
Admission: RE | Admit: 2015-06-25 | Discharge: 2015-06-25 | DRG: 502 | Disposition: A | Discharge status: Home / Self Care | Payer: No Typology Code available for payment source | Source: Ambulatory Visit | Attending: Orthopedic Surgery | Admitting: Orthopedic Surgery

## 2015-06-25 DIAGNOSIS — M25512 Pain in left shoulder: Secondary | ICD-10-CM | POA: Diagnosis present

## 2015-06-25 DIAGNOSIS — I10 Essential (primary) hypertension: Secondary | ICD-10-CM | POA: Diagnosis not present

## 2015-06-25 DIAGNOSIS — Z880 Allergy status to penicillin: Secondary | ICD-10-CM | POA: Diagnosis not present

## 2015-06-25 DIAGNOSIS — M13812 Other specified arthritis, left shoulder: Secondary | ICD-10-CM | POA: Diagnosis not present

## 2015-06-25 DIAGNOSIS — M7542 Impingement syndrome of left shoulder: Secondary | ICD-10-CM | POA: Insufficient documentation

## 2015-06-25 DIAGNOSIS — Z79899 Other long term (current) drug therapy: Secondary | ICD-10-CM

## 2015-06-25 DIAGNOSIS — Z96653 Presence of artificial knee joint, bilateral: Secondary | ICD-10-CM | POA: Diagnosis present

## 2015-06-25 DIAGNOSIS — Z6841 Body Mass Index (BMI) 40.0 and over, adult: Secondary | ICD-10-CM | POA: Diagnosis not present

## 2015-06-25 DIAGNOSIS — F329 Major depressive disorder, single episode, unspecified: Secondary | ICD-10-CM | POA: Diagnosis not present

## 2015-06-25 DIAGNOSIS — M75102 Unspecified rotator cuff tear or rupture of left shoulder, not specified as traumatic: Secondary | ICD-10-CM | POA: Diagnosis present

## 2015-06-25 HISTORY — PX: SHOULDER ARTHROSCOPY: SHX128

## 2015-06-25 HISTORY — PX: SHOULDER OPEN ROTATOR CUFF REPAIR: SHX2407

## 2015-06-25 SURGERY — ARTHROSCOPY, SHOULDER
Anesthesia: Regional | Site: Shoulder | Laterality: Left

## 2015-06-25 MED ORDER — PHENYLEPHRINE HCL 10 MG/ML IJ SOLN
INTRAMUSCULAR | Status: DC | PRN
  Administered 2015-06-25: 40 ug via INTRAVENOUS
  Administered 2015-06-25: 80 ug via INTRAVENOUS

## 2015-06-25 MED ORDER — DEXAMETHASONE SODIUM PHOSPHATE 4 MG/ML IJ SOLN
INTRAMUSCULAR | Status: DC | PRN
  Administered 2015-06-25: 10 mg via INTRAVENOUS

## 2015-06-25 MED ORDER — PROPOFOL 500 MG/50ML IV EMUL
INTRAVENOUS | Status: AC
  Filled 2015-06-25: qty 50

## 2015-06-25 MED ORDER — CLINDAMYCIN PHOSPHATE 900 MG/50ML IV SOLN
INTRAVENOUS | Status: AC
  Filled 2015-06-25: qty 50

## 2015-06-25 MED ORDER — GLYCOPYRROLATE 0.2 MG/ML IJ SOLN: 0.2000 mg | Freq: Once | INTRAMUSCULAR | Status: DC | PRN

## 2015-06-25 MED ORDER — MIDAZOLAM HCL 2 MG/2ML IJ SOLN
1.0000 mg | INTRAMUSCULAR | Status: DC | PRN
  Administered 2015-06-25: 2 mg via INTRAVENOUS

## 2015-06-25 MED ORDER — SUCCINYLCHOLINE CHLORIDE 20 MG/ML IJ SOLN
INTRAMUSCULAR | Status: DC | PRN
  Administered 2015-06-25: 140 mg via INTRAVENOUS

## 2015-06-25 MED ORDER — SUCCINYLCHOLINE CHLORIDE 20 MG/ML IJ SOLN
INTRAMUSCULAR | Status: AC
  Filled 2015-06-25: qty 1

## 2015-06-25 MED ORDER — SCOPOLAMINE 1 MG/3DAYS TD PT72
1.0000 | MEDICATED_PATCH | Freq: Once | TRANSDERMAL | Status: DC | PRN
Start: 2015-06-25 — End: 2015-06-25

## 2015-06-25 MED ORDER — LIDOCAINE HCL (CARDIAC) 20 MG/ML IV SOLN
INTRAVENOUS | Status: AC
  Filled 2015-06-25: qty 5

## 2015-06-25 MED ORDER — FENTANYL CITRATE (PF) 100 MCG/2ML IJ SOLN
50.0000 ug | INTRAMUSCULAR | Status: DC | PRN
  Administered 2015-06-25: 100 ug via INTRAVENOUS

## 2015-06-25 MED ORDER — MIDAZOLAM HCL 2 MG/2ML IJ SOLN
INTRAMUSCULAR | Status: AC
  Filled 2015-06-25: qty 2

## 2015-06-25 MED ORDER — LIDOCAINE HCL (CARDIAC) 20 MG/ML IV SOLN
INTRAVENOUS | Status: DC | PRN
  Administered 2015-06-25: 60 mg via INTRAVENOUS

## 2015-06-25 MED ORDER — CLINDAMYCIN PHOSPHATE 900 MG/50ML IV SOLN
900.0000 mg | Freq: Once | INTRAVENOUS | Status: AC
  Administered 2015-06-25: 900 mg via INTRAVENOUS

## 2015-06-25 MED ORDER — LACTATED RINGERS IV SOLN
INTRAVENOUS | Status: DC
  Administered 2015-06-25: 09:00:00 via INTRAVENOUS
  Administered 2015-06-25: 10 mL/h via INTRAVENOUS
  Administered 2015-06-25: 08:00:00 via INTRAVENOUS

## 2015-06-25 MED ORDER — ATROPINE SULFATE 0.4 MG/ML IJ SOLN
INTRAMUSCULAR | Status: AC
  Filled 2015-06-25: qty 1

## 2015-06-25 MED ORDER — OXYCODONE-ACETAMINOPHEN 5-325 MG PO TABS: 1.0000 | ORAL_TABLET | ORAL | Status: DC | PRN

## 2015-06-25 MED ORDER — ONDANSETRON HCL 4 MG/2ML IJ SOLN
INTRAMUSCULAR | Status: AC
  Filled 2015-06-25: qty 2

## 2015-06-25 MED ORDER — LIDOCAINE HCL 4 % EX SOLN
CUTANEOUS | Status: DC | PRN
  Administered 2015-06-25: 2 mL via TOPICAL

## 2015-06-25 MED ORDER — HYDROMORPHONE HCL 2 MG PO TABS: 2.0000 mg | ORAL_TABLET | Freq: Four times a day (QID) | ORAL | Status: DC | PRN

## 2015-06-25 MED ORDER — PHENYLEPHRINE 40 MCG/ML (10ML) SYRINGE FOR IV PUSH (FOR BLOOD PRESSURE SUPPORT)
PREFILLED_SYRINGE | INTRAVENOUS | Status: AC
  Filled 2015-06-25: qty 10

## 2015-06-25 MED ORDER — ONDANSETRON HCL 4 MG/2ML IJ SOLN
INTRAMUSCULAR | Status: DC | PRN
  Administered 2015-06-25: 4 mg via INTRAVENOUS

## 2015-06-25 MED ORDER — ARTIFICIAL TEARS OP OINT
TOPICAL_OINTMENT | OPHTHALMIC | Status: DC | PRN
  Administered 2015-06-25: 1 via OPHTHALMIC

## 2015-06-25 MED ORDER — PHENYLEPHRINE HCL 10 MG/ML IJ SOLN
10.0000 mg | INTRAVENOUS | Status: DC | PRN
  Administered 2015-06-25: 100 ug/min via INTRAVENOUS

## 2015-06-25 MED ORDER — EPINEPHRINE HCL 1 MG/ML IJ SOLN
INTRAMUSCULAR | Status: AC
  Filled 2015-06-25: qty 1

## 2015-06-25 MED ORDER — DEXAMETHASONE SODIUM PHOSPHATE 10 MG/ML IJ SOLN
INTRAMUSCULAR | Status: AC
  Filled 2015-06-25: qty 1

## 2015-06-25 MED ORDER — PROPOFOL 10 MG/ML IV BOLUS
INTRAVENOUS | Status: DC | PRN
  Administered 2015-06-25: 50 mg via INTRAVENOUS
  Administered 2015-06-25: 200 mg via INTRAVENOUS

## 2015-06-25 MED ORDER — FENTANYL CITRATE (PF) 100 MCG/2ML IJ SOLN
INTRAMUSCULAR | Status: AC
  Filled 2015-06-25: qty 2

## 2015-06-25 MED ORDER — CHLORHEXIDINE GLUCONATE 4 % EX LIQD: 60.0000 mL | Freq: Once | CUTANEOUS | Status: DC

## 2015-06-25 MED ORDER — ARTIFICIAL TEARS OP OINT
TOPICAL_OINTMENT | OPHTHALMIC | Status: AC
  Filled 2015-06-25: qty 3.5

## 2015-06-25 MED ORDER — BUPIVACAINE-EPINEPHRINE (PF) 0.5% -1:200000 IJ SOLN
INTRAMUSCULAR | Status: DC | PRN
  Administered 2015-06-25: 30 mL via PERINEURAL

## 2015-06-25 SURGICAL SUPPLY — 75 items
ANCHOR PEEK 4.75X19.1 SWLK C (Anchor) ×6 IMPLANT
BENZOIN TINCTURE PRP APPL 2/3 (GAUZE/BANDAGES/DRESSINGS) ×3 IMPLANT
BLADE SURG 15 STRL LF DISP TIS (BLADE) ×1 IMPLANT
BLADE SURG 15 STRL SS (BLADE) ×2
BLADE VORTEX 6.0 (BLADE) ×3 IMPLANT
CANNULA 5.75X71 LONG (CANNULA) IMPLANT
CANNULA TWIST IN 8.25X7CM (CANNULA) IMPLANT
CLOSURE WOUND 1/2 X4 (GAUZE/BANDAGES/DRESSINGS) ×1
CUTTER MENISCUS  4.2MM (BLADE) ×2
CUTTER MENISCUS 4.2MM (BLADE) ×1 IMPLANT
DECANTER SPIKE VIAL GLASS SM (MISCELLANEOUS) IMPLANT
DRAPE INCISE IOBAN 66X45 STRL (DRAPES) ×3 IMPLANT
DRAPE STERI 35X30 U-POUCH (DRAPES) ×3 IMPLANT
DRAPE SURG 17X23 STRL (DRAPES) ×3 IMPLANT
DRAPE U-SHAPE 47X51 STRL (DRAPES) ×3 IMPLANT
DRAPE U-SHAPE 76X120 STRL (DRAPES) ×6 IMPLANT
DRSG EMULSION OIL 3X3 NADH (GAUZE/BANDAGES/DRESSINGS) ×3 IMPLANT
DRSG PAD ABDOMINAL 8X10 ST (GAUZE/BANDAGES/DRESSINGS) ×6 IMPLANT
DURAPREP 26ML APPLICATOR (WOUND CARE) ×3 IMPLANT
ELECT REM PT RETURN 9FT ADLT (ELECTROSURGICAL) ×3
ELECTRODE REM PT RTRN 9FT ADLT (ELECTROSURGICAL) ×1 IMPLANT
GAUZE SPONGE 4X4 12PLY STRL (GAUZE/BANDAGES/DRESSINGS) ×3 IMPLANT
GLOVE BIOGEL PI IND STRL 7.0 (GLOVE) ×2 IMPLANT
GLOVE BIOGEL PI IND STRL 8 (GLOVE) ×2 IMPLANT
GLOVE BIOGEL PI INDICATOR 7.0 (GLOVE) ×4
GLOVE BIOGEL PI INDICATOR 8 (GLOVE) ×4
GLOVE ECLIPSE 6.5 STRL STRAW (GLOVE) ×6 IMPLANT
GLOVE ECLIPSE 7.5 STRL STRAW (GLOVE) ×6 IMPLANT
GOWN STRL REUS W/ TWL LRG LVL3 (GOWN DISPOSABLE) ×1 IMPLANT
GOWN STRL REUS W/ TWL XL LVL3 (GOWN DISPOSABLE) ×1 IMPLANT
GOWN STRL REUS W/TWL LRG LVL3 (GOWN DISPOSABLE) ×2
GOWN STRL REUS W/TWL XL LVL3 (GOWN DISPOSABLE) ×5 IMPLANT
IV NS IRRIG 3000ML ARTHROMATIC (IV SOLUTION) ×6 IMPLANT
MANIFOLD NEPTUNE II (INSTRUMENTS) ×3 IMPLANT
NDL SUT 6 .5 CRC .975X.05 MAYO (NEEDLE) IMPLANT
NEEDLE 1/2 CIR CATGUT .05X1.09 (NEEDLE) IMPLANT
NEEDLE HYPO 18GX1.5 BLUNT FILL (NEEDLE) ×3 IMPLANT
NEEDLE MAYO TAPER (NEEDLE)
NEEDLE SCORPION MULTI FIRE (NEEDLE) ×3 IMPLANT
NS IRRIG 1000ML POUR BTL (IV SOLUTION) IMPLANT
PACK ARTHROSCOPY DSU (CUSTOM PROCEDURE TRAY) ×3 IMPLANT
PACK BASIN DAY SURGERY FS (CUSTOM PROCEDURE TRAY) ×3 IMPLANT
PASSER SUT SWANSON 36MM LOOP (INSTRUMENTS) IMPLANT
PENCIL BUTTON HOLSTER BLD 10FT (ELECTRODE) IMPLANT
SET IRRIG Y TYPE TUR BLADDER L (SET/KITS/TRAYS/PACK) ×3 IMPLANT
SLEEVE SCD COMPRESS KNEE MED (MISCELLANEOUS) ×3 IMPLANT
SLING ARM FOAM STRAP LRG (SOFTGOODS) ×3 IMPLANT
SLING ARM IMMOBILIZER LRG (SOFTGOODS) IMPLANT
SLING ARM MED ADULT FOAM STRAP (SOFTGOODS) IMPLANT
SLING ARM XL FOAM STRAP (SOFTGOODS) IMPLANT
SPONGE LAP 4X18 X RAY DECT (DISPOSABLE) IMPLANT
STRIP CLOSURE SKIN 1/2X4 (GAUZE/BANDAGES/DRESSINGS) ×2 IMPLANT
SUCTION FRAZIER HANDLE 10FR (MISCELLANEOUS) ×2
SUCTION TUBE FRAZIER 10FR DISP (MISCELLANEOUS) ×1 IMPLANT
SUT ETHIBOND 2 OS 4 DA (SUTURE) IMPLANT
SUT ETHILON 4 0 PS 2 18 (SUTURE) IMPLANT
SUT MNCRL AB 3-0 PS2 18 (SUTURE) ×3 IMPLANT
SUT PDS AB 0 CT 36 (SUTURE) IMPLANT
SUT TICRON 1 T 12 (SUTURE) IMPLANT
SUT TIGER TAPE 7 IN WHITE (SUTURE) ×3 IMPLANT
SUT VIC AB 0 CT1 27 (SUTURE)
SUT VIC AB 0 CT1 27XBRD ANBCTR (SUTURE) IMPLANT
SUT VIC AB 1 CT1 27 (SUTURE) ×2
SUT VIC AB 1 CT1 27XBRD ANBCTR (SUTURE) ×1 IMPLANT
SUT VIC AB 2-0 SH 27 (SUTURE)
SUT VIC AB 2-0 SH 27XBRD (SUTURE) IMPLANT
SYR 5ML LL (SYRINGE) ×3 IMPLANT
TAPE FIBER 2MM 7IN #2 BLUE (SUTURE) ×3 IMPLANT
TOWEL OR 17X24 6PK STRL BLUE (TOWEL DISPOSABLE) ×3 IMPLANT
TOWEL OR NON WOVEN STRL DISP B (DISPOSABLE) ×3 IMPLANT
TUBE CONNECTING 20'X1/4 (TUBING)
TUBE CONNECTING 20X1/4 (TUBING) IMPLANT
WAND STAR VAC 90 (SURGICAL WAND) ×3 IMPLANT
WATER STERILE IRR 1000ML POUR (IV SOLUTION) ×3 IMPLANT
YANKAUER SUCT BULB TIP NO VENT (SUCTIONS) ×3 IMPLANT

## 2015-06-25 NOTE — Discharge Instructions (Signed)
Discharge Instructions after Arthroscopic Shoulder Repair ° ° °A sling has been provided for you. Remain in your sling at all times. This includes sleeping in your sling.  °Use ice on the shoulder intermittently over the first 48 hours after surgery.  °Pain medicine has been prescribed for you.  °Use your medicine liberally over the first 48 hours, and then you can begin to taper your use. You may take Extra Strength Tylenol or Tylenol only in place of the pain pills. DO NOT take ANY nonsteroidal anti-inflammatory pain medications: Advil, Motrin, Ibuprofen, Aleve, Naproxen, or Narprosyn.  °You may remove your dressing after two days. If the incision sites are still moist, place a Band-Aid over the moist site(s). Change Band-Aids daily until dry.  °You may shower 5 days after surgery. The incisions CANNOT get wet prior to 5 days. Simply allow the water to wash over the site and then pat dry. Do not rub the incisions. Make sure your axilla (armpit) is completely dry after showering.  °Take one aspirin a day for 2 weeks after surgery, unless you have an aspirin sensitivity/ allergy or asthma. ° ° °Please call [phone] during normal business hours or [phone] after hours for any problems. Including the following: ° °- excessive redness of the incisions °- drainage for more than 4 days °- fever of more than 101.5 F ° °*Please note that pain medications will not be refilled after hours or on weekends. ° ° °Post Anesthesia Home Care Instructions ° °Activity: °Get plenty of rest for the remainder of the day. A responsible adult should stay with you for 24 hours following the procedure.  °For the next 24 hours, DO NOT: °-Drive a car °-Operate machinery °-Drink alcoholic beverages °-Take any medication unless instructed by your physician °-Make any legal decisions or sign important papers. ° °Meals: °Start with liquid foods such as gelatin or soup. Progress to regular foods as tolerated. Avoid greasy, spicy, heavy  foods. If nausea and/or vomiting occur, drink only clear liquids until the nausea and/or vomiting subsides. Call your physician if vomiting continues. ° °Special Instructions/Symptoms: °Your throat may feel dry or sore from the anesthesia or the breathing tube placed in your throat during surgery. If this causes discomfort, gargle with warm salt water. The discomfort should disappear within 24 hours. ° °If you had a scopolamine patch placed behind your ear for the management of post- operative nausea and/or vomiting: ° °1. The medication in the patch is effective for 72 hours, after which it should be removed.  Wrap patch in a tissue and discard in the trash. Wash hands thoroughly with soap and water. °2. You may remove the patch earlier than 72 hours if you experience unpleasant side effects which may include dry mouth, dizziness or visual disturbances. °3. Avoid touching the patch. Wash your hands with soap and water after contact with the patch. °  °Regional Anesthesia Blocks ° °1. Numbness or the inability to move the "blocked" extremity may last from 3-48 hours after placement. The length of time depends on the medication injected and your individual response to the medication. If the numbness is not going away after 48 hours, call your surgeon. ° °2. The extremity that is blocked will need to be protected until the numbness is gone and the  Strength has returned. Because you cannot feel it, you will need to take extra care to avoid injury. Because it may be weak, you may have difficulty moving it or using it. You may not know   what position it is in without looking at it while the block is in effect. ° °3. For blocks in the legs and feet, returning to weight bearing and walking needs to be done carefully. You will need to wait until the numbness is entirely gone and the strength has returned. You should be able to move your leg and foot normally before you try and bear weight or walk. You will need someone to  be with you when you first try to ensure you do not fall and possibly risk injury. ° °4. Bruising and tenderness at the needle site are common side effects and will resolve in a few days. ° °5. Persistent numbness or new problems with movement should be communicated to the surgeon or the [facility] ([phone])/ [facility] ([phone]). ° ° ° °

## 2015-06-25 NOTE — Anesthesia Postprocedure Evaluation (Signed)
Anesthesia Post Note  Patient: Hamilton Capriandall Gudger  Procedure(s) Performed: Procedure(s) (LRB): ARTHROSCOPY SHOULDER (Left) ROTATOR CUFF REPAIR SHOULDER OPEN (Left)  Patient location during evaluation: PACU Anesthesia Type: General Level of consciousness: awake and alert Pain management: pain level controlled Vital Signs Assessment: post-procedure vital signs reviewed and stable Respiratory status: spontaneous breathing, nonlabored ventilation, respiratory function stable and patient connected to nasal cannula oxygen Cardiovascular status: blood pressure returned to baseline and stable Postop Assessment: no signs of nausea or vomiting Anesthetic complications: no    Last Vitals:  Filed Vitals:   06/25/15 1115 06/25/15 1140  BP: 138/86 150/88  Pulse: 85 88  Temp:  36.7 C  Resp: 15 14    Last Pain:  Filed Vitals:   06/25/15 1144  PainSc: 0-No pain                 Jasiyah Poland L

## 2015-06-25 NOTE — Progress Notes (Signed)
Assisted Dr. Carmin MuskratEwelle   with left, ultrasound guided, interscalene  block. Side rails up, monitors on throughout procedure. See vital signs in flow sheet. Tolerated Procedure well.

## 2015-06-25 NOTE — Transfer of Care (Signed)
Immediate Anesthesia Transfer of Care Note  Patient: [name]  Procedure(s) Performed: Procedure(s) with comments: ARTHROSCOPY SHOULDER (Left) - Left shoulder arthroscopy with subacromial decompression and distal clavicle resection.  ROTATOR CUFF REPAIR SHOULDER OPEN (Left)  Patient Location: PACU  Anesthesia Type:GA combined with regional for post-op pain  Level of Consciousness: awake, alert  and patient cooperative  Airway & Oxygen Therapy: Patient Spontanous Breathing and Patient connected to face mask oxygen  Post-op Assessment: Report given to RN, Post -op Vital signs reviewed and stable and Patient moving all extremities  Post vital signs: Reviewed and stable  Last Vitals:  Filed Vitals:   06/25/15 0830 06/25/15 1035  BP:    Pulse: 85 91  Temp:    Resp: 12 16    Complications: No apparent anesthesia complications

## 2015-06-25 NOTE — Anesthesia Procedure Notes (Addendum)
Anesthesia Regional Block:  Interscalene brachial plexus block  Pre-Anesthetic Checklist: ,, timeout performed, Correct Patient, Correct Site, Correct Laterality, Correct Procedure, Correct Position, site marked, Risks and benefits discussed,  Surgical consent,  Pre-op evaluation,  At surgeon's request and post-op pain management  Laterality: Left  Prep: chloraprep       Needles:  Injection technique: Single-shot  Needle Type: Echogenic Needle     Needle Length: 13cm 13 cm Needle Gauge: 21 and 21 G    Additional Needles:  Procedures: ultrasound guided (picture in chart) Interscalene brachial plexus block Narrative:  Start time: 06/25/2015 8:05 AM End time: 06/25/2015 8:15 AM Injection made incrementally with aspirations every 5 mL.  Performed by: Personally  Anesthesiologist: Ronelle NighEWELL, CHARLES  Additional Notes: Patient tolerated the procedure well without complications   Procedure Name: Intubation Date/Time: 06/25/2015 8:54 AM Performed by: Curly ShoresRAFT, Rozelia Catapano W Pre-anesthesia Checklist: Patient identified, Emergency Drugs available, Suction available and Patient being monitored Patient Re-evaluated:Patient Re-evaluated prior to inductionOxygen Delivery Method: Circle System Utilized Preoxygenation: Pre-oxygenation with 100% oxygen Intubation Type: IV induction Ventilation: Mask ventilation without difficulty Laryngoscope Size: Glidescope and 4 Grade View: Grade II Tube type: Oral Tube size: 8.0 mm Number of attempts: 2 Airway Equipment and Method: Stylet,  Oral airway and Video-laryngoscopy Placement Confirmation: ETT inserted through vocal cords under direct vision,  positive ETCO2 and breath sounds checked- equal and bilateral Secured at: 23 cm Tube secured with: Tape Dental Injury: Teeth and Oropharynx as per pre-operative assessment

## 2015-06-25 NOTE — Brief Op Note (Signed)
06/25/2015  10:15 AM  PATIENT:  [name]  65 y.o. male  PRE-OPERATIVE DIAGNOSIS:  Unspecified disorder of synovium and tendon left shoulder.   POST-OPERATIVE DIAGNOSIS:  impingement rotator cuff tear left shoulder  PROCEDURE:  Procedure(s) with comments: ARTHROSCOPY SHOULDER (Left) - Left shoulder arthroscopy with subacromial decompression and distal clavicle resection.  ROTATOR CUFF REPAIR SHOULDER OPEN (Left)  SURGEON:  Surgeon(s) and Role:    * Jodi GeraldsJohn Seth Friedlander, MD - Primary  PHYSICIAN ASSISTANT:   ASSISTANTS: bethune   ANESTHESIA:   general  EBL:  Total I/O In: 1500 [I.V.:1500] Out: -   BLOOD ADMINISTERED:none  DRAINS: none   LOCAL MEDICATIONS USED:  NONE  SPECIMEN:  No Specimen  DISPOSITION OF SPECIMEN:  N/A  COUNTS:  YES  TOURNIQUET:  * No tourniquets in log *  DICTATION: .Other Dictation: Dictation Number [id number]  PLAN OF CARE: Discharge to home after PACU  PATIENT DISPOSITION:  PACU - hemodynamically stable.   Delay start of Pharmacological VTE agent (>24hrs) due to surgical blood loss or risk of bleeding: no

## 2015-06-25 NOTE — Consult Note (Signed)
  i have reviewed the H@P  and there are no changes in the last 12 hrs. Glayds In[place]

## 2015-06-26 NOTE — Op Note (Signed)
NAMELATRELL, POTEMPA             ACCOUNT NO.:  [id number]  MEDICAL RECORD NO.:  [id number]  LOCATION:  PERIO                        FACILITY:  [facility]  PHYSICIAN:  Harvie Junior, M.D.   DATE OF BIRTH:  June 14, 1950  DATE OF PROCEDURE:  06/25/2015 DATE OF DISCHARGE:                              OPERATIVE REPORT   PREOPERATIVE DIAGNOSIS:  Impingement, acromioclavicular joint arthritis, suspected rotator cuff tear.  POSTOPERATIVE DIAGNOSES:  Impingement, acromioclavicular joint arthritis, suspected rotator cuff tear.  PROCEDURES: 1. Mini open rotator cuff repair of an acutely torn rotator cuff. 2. Arthroscopic subacromial decompression. 3. Arthroscopic distal clavicle resection over 20 mm.  SURGEON:  Harvie Junior, M.D.  ASSISTANT:  Marshia Ly, P.A.  ANESTHESIA:  General.  BRIEF HISTORY:  Mr. Duthie is a 65 year old male with long history of significant complaints of left shoulder pain.  He was evaluated in the office and noted to have a large subacromial spur as well as AC joint arthritis.  He had been injected with excellent relief.  MRI was obtained, which showed that he had by their reading, a questionable rotator cuff tear, by my reading, a definite rotator cuff tear.  After failure of conservative care, he was taken to the operating room for evaluation and fixation as needed.  DESCRIPTION OF PROCEDURE:  The patient was taken to the operating room. After adequate anesthesia was obtained with general anesthetic, the patient was placed supine on the operating table.  Left shoulder was then prepped and draped in usual sterile fashion.  Following this, routine arthroscopic examination of the shoulder revealed that there was no significant abnormality of the glenohumeral joint other than some mild fray on the glenoid and posterosuperior labral fray, and these were debrided within the glenohumeral joint.  The biceps tendon looked quite good on the undersurface of the  rotator cuff with minimal debridement. The rotator cuff tear was easily identifiable.  We probed the biceps tendon at length as there was some question about issues there that actually looked quite good.  There was some fray coming off the subscap as well, but with cleaning up, this actually looked quite good.  Once this was completed, attention turned out to the glenohumeral joint into the subacromial space and anterolateral acromioplasty was performed of a very large subacromial spur.  The distal clavicle was resected over 20 mm from the anterior compartment.  Subtotal bursectomy was performed. Attention was turned to the rotator cuff laterally.  Rotator cuff laterally did appear to be amenable to arthroscopic repair.  At that point, we felt that some of the arthroscopic findings were concerning for extension and I felt that I wanted to get a little better look at it, so we made a small incision laterally and dissected down through the deltoid, identified the tear, extended it anterior and posterior to the extent that it needed in a single look, it still looked like a single anchor repair.  We took a 4.75 SwiveLock medially, roughened up the bone in the insertion of the rotator cuff and then took two FiberTape anchors and put those out laterally to a single SwiveLock, which we could do open, might not have been able to do that arthroscopically  and so I was very satisfied with the repair at this point.  The watertight repair had been achieved.  The gloved finger could to be placed to the distal clavicle and the subacromial decompression, this looked quite good.  At this point, the wound was irrigated and suctioned dry.  The deltoid was closed with 1 Vicryl running, skin with 0 and 2-0 Vicryl, and 3-0 Monocryl subcuticular.  Benzoin and Steri-Strips were applied.  Sterile compressive dressing was applied and the patient was taken to the recovery room, he was noted to be in satisfactory  condition.  Estimated blood loss for the procedure was minimal.     Harvie JuniorJohn L. Wyland Rastetter, M.D.     Ranae PlumberJLG/MEDQ  D:  06/25/2015  T:  06/26/2015  Job:  [id number]

## 2015-06-28 ENCOUNTER — Encounter (HOSPITAL_BASED_OUTPATIENT_CLINIC_OR_DEPARTMENT_OTHER): Payer: Self-pay | Admitting: Orthopedic Surgery

## 2015-08-30 ENCOUNTER — Ambulatory Visit: Payer: No Typology Code available for payment source | Attending: Orthopedic Surgery | Admitting: Physical Therapy

## 2015-08-30 DIAGNOSIS — M25512 Pain in left shoulder: Secondary | ICD-10-CM

## 2015-08-30 DIAGNOSIS — M25612 Stiffness of left shoulder, not elsewhere classified: Secondary | ICD-10-CM | POA: Diagnosis present

## 2015-08-30 DIAGNOSIS — R293 Abnormal posture: Secondary | ICD-10-CM

## 2015-08-30 NOTE — Patient Instructions (Signed)
Cane Overhead - Standing    With arms straight, hold cane forward at waist. Raise cane above head. Hold _2-3__ seconds. Repeat __10_ times. Do __2-3_ times per day.  Copyright  VHI. All rights reserved.   SELF ASSISTED WITH OBJECT: Shoulder External Rotation - Supine    Stand up for this exercise  Hold cane with both hands, keep elbow bent and next to body. Rotate arm away from body. _10__ reps per set, _2-3__ sets per day. Add towel to keep elbow at side.  Copyright  VHI. All rights reserved.    Scapular Retraction (Standing)    With arms at sides, pinch shoulder blades together. Repeat __10__ times per set. Do __1__ sets per session. Do __2-3__ sessions per day.  http://orth.exer.us/944   Copyright  VHI. All rights reserved.

## 2015-08-30 NOTE — Therapy (Signed)
[facility] [address]  [address] [place], [place], [address] Phone: [phone]   Fax:  [phone]  Physical Therapy Evaluation  Patient Details  Name: [name] MRN: [id number] Date of Birth: [birth year] Referring Provider: Dr Jodi Geralds  Encounter Date: 08/30/2015      PT End of Session - 08/30/15 0921    Visit Number 1   Number of Visits 16   Date for PT Re-Evaluation 10/25/15   Authorization Type VA - 35 visits authorized (no PTA)   PT Start Time 0835   PT Stop Time 0910   PT Time Calculation (min) 35 min   Activity Tolerance Patient tolerated treatment well   Behavior During Therapy [facility] for tasks assessed/performed      Past Medical History  Diagnosis Date  . Hypertension   . Depression     Past Surgical History  Procedure Laterality Date  . 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 Colon surgery  2003    diverticulitis  . Carpal tunnel release Bilateral 2005  . Shoulder arthroscopy Left 06/25/2015    Procedure: ARTHROSCOPY SHOULDER;  Surgeon: Jodi Geralds, MD;  Location: [facility];  Service: Orthopedics;  Laterality: Left;  Left shoulder arthroscopy with subacromial decompression and distal clavicle resection.   . Shoulder open rotator cuff repair Left 06/25/2015    Procedure: ROTATOR CUFF REPAIR SHOULDER OPEN;  Surgeon: Jodi Geralds, MD;  Location: [facility];  Service: Orthopedics;  Laterality: Left;    There were no vitals filed for this visit.       Subjective Assessment - 08/30/15 0835    Subjective Pt is a 65 y/o male who presents to OPPT s/p L RTC repair on 06/25/15.  Pt presents today with continued difficutly with using LUE.  Pt has had no PT since surgery and presents today ( 9 1/2 weeks post-op today).   Pertinent History depression, HTN   Limitations House hold activities;Lifting   Patient Stated Goals improve motion and strength in arm, decrease pain    Currently in Pain? Yes   Pain Score 0-No pain  "up to 10"   Pain Location Shoulder   Pain Orientation Left   Pain Descriptors / Indicators --  "like a pulled muscle"   Pain Type Surgical pain   Pain Onset More than a month ago   Pain Frequency Intermittent   Aggravating Factors  trying to lift something ("like a gallon of milk"), reaching   Pain Relieving Factors sling resting position            [facility] PT Assessment - 08/30/15 0839    Assessment   Medical Diagnosis L RTC repair   Referring Provider Dr Jodi Geralds   Onset Date/Surgical Date 06/25/15   Hand Dominance Right   Next MD Visit 09/02/15   Prior Therapy none   Precautions   Precautions Shoulder   Precaution Comments per protocol   Balance Screen   Has the patient fallen in the past 6 months No   Has the patient had a decrease in activity level because of a fear of falling?  No   Is the patient reluctant to leave their home because of a fear of falling?  No   Home [employer]ist information centre manager residence   Prior Function   Level of Independence Independent   Vocation Full time employment   Biochemist, clinical   Leisure play golf  Cognition   Overall Cognitive Status Within Functional Limits for tasks assessed   Observation/Other Assessments   Focus on Therapeutic Outcomes (FOTO)  49 (51% limited; predicted 29% limited)   Posture/Postural Control   Posture/Postural Control Postural limitations   Postural Limitations Rounded Shoulders;Forward head;Increased thoracic kyphosis   AROM   Overall AROM Comments substitution patterns noted especially shoulder shrug; able to correct with cues   AROM Assessment Site Shoulder   Right/Left Shoulder Right;Left   Right Shoulder Extension 70 Degrees   Right Shoulder Flexion 165 Degrees   Right Shoulder ABduction 153 Degrees   Right Shoulder Internal Rotation 90 Degrees   Right Shoulder External Rotation 89 Degrees   Left Shoulder Extension  60 Degrees   Left Shoulder Flexion 110 Degrees   Left Shoulder ABduction 135 Degrees   Left Shoulder Internal Rotation 87 Degrees   Left Shoulder External Rotation 41 Degrees   PROM   PROM Assessment Site Shoulder   Right/Left Shoulder Left   Left Shoulder Extension 65 Degrees   Left Shoulder Flexion 155 Degrees   Left Shoulder ABduction 143 Degrees   Left Shoulder Internal Rotation 90 Degrees   Left Shoulder External Rotation 45 Degrees   Strength   Strength Assessment Site Shoulder;Elbow;Hand   Right/Left Shoulder Right;Left   Right Shoulder Flexion 5/5   Right Shoulder ABduction 5/5   Right Shoulder Internal Rotation 4/5   Right Shoulder External Rotation 4/5   Left Shoulder Flexion 3-/5   Left Shoulder Extension 3-/5   Left Shoulder ABduction 3-/5   Left Shoulder Internal Rotation 4/5   Left Shoulder External Rotation 3-/5   Right/Left Elbow Left;Right   Right Elbow Flexion 5/5   Right Elbow Extension 5/5   Left Elbow Flexion 4/5   Left Elbow Extension 4/5   Right Hand Grip (lbs) 68  67, 65, 72   Left Hand Grip (lbs) 73.33  69, 76, 75                   [facility] Adult PT Treatment/Exercise - 08/30/15 0839    Exercises   Exercises Shoulder   Shoulder Exercises: Standing   External Rotation AAROM;Left;10 reps   External Rotation Limitations with cane   Flexion AAROM;Left;10 reps   Flexion Limitations with cane   Retraction Both;10 reps;Theraband   Theraband Level (Shoulder Retraction) Level 2 (Red)   Retraction Limitations cues for technique                PT Education - 08/30/15 0920    Education provided Yes   Education Details POC, goals of care, HEP   Person(s) Educated Patient   Methods Explanation;Demonstration;Handout   Comprehension Verbalized understanding;Returned demonstration;Need further instruction          PT Short Term Goals - 08/30/15 0925    PT SHORT TERM GOAL #1   Title verbalize understanding of posture/body mechanics  to decrease risk of reinjury (09/27/15)   Time 4   Period Weeks   Status New   PT SHORT TERM GOAL #2   Title improve L shoulder AROM to WNL for improved function (09/27/15)   Time 4   Period Weeks   Status New           PT Long Term Goals - 08/30/15 0931    PT LONG TERM GOAL #1   Title independent with HEP (10/25/15)   Time 8   Period Weeks   Status New   PT LONG TERM GOAL #2   Title  improve L shoulder strength to at least 4/5 for improved function (10/25/15)   Time 8   Period Weeks   Status New   PT LONG TERM GOAL #3   Title report ability to reach for items without increase in pain (10/25/15)   Time 8   Period Weeks   Status New   PT LONG TERM GOAL #4   Title report ability to wash hair without substitution, compensation or pain (10/25/15)   Time 8   Period Weeks   Status New               Plan - Sep 20, 2015 6962    Clinical Impression Statement Pt is a 65 y/o male who presents to OPPT 9.5 weeks post op for L RTC repair.   At this time, per protocol pt without restrictions, but continues to demonstrate decreased ROM, strength and pain affecting function.  Will plan to address these deficits to maximize function and decrease risk of reinjury.     Rehab Potential Good   PT Frequency 2x / week   PT Duration 8 weeks   PT Treatment/Interventions ADLs/Self Care Home Management;Electrical Stimulation;Cryotherapy;Moist Heat;Ultrasound;Patient/family education;Neuromuscular re-education;Therapeutic exercise;Therapeutic activities;Functional mobility training;Passive range of motion;Manual techniques;Vasopneumatic Device   PT Next Visit Plan ROM, progress strengthening as able; posture exercises, modalities PRN; watch substitution patterns   Consulted and Agree with Plan of Care Patient      Patient will benefit from skilled therapeutic intervention in order to improve the following deficits and impairments:  Decreased strength, Impaired UE functional use, Pain, Decreased range  of motion, Postural dysfunction  Visit Diagnosis: Pain in left shoulder - Plan: PT plan of care cert/re-cert  Stiffness of left shoulder, not elsewhere classified - Plan: PT plan of care cert/re-cert  Abnormal posture - Plan: PT plan of care cert/re-cert      G-Codes - 2015-09-20 0934    Functional Assessment Tool Used FOTO   Functional Limitation Carrying, moving and handling objects   Carrying, Moving and Handling Objects Current Status (X5284) At least 40 percent but less than 60 percent impaired, limited or restricted   Carrying, Moving and Handling Objects Goal Status (X3244) At least 20 percent but less than 40 percent impaired, limited or restricted       Problem List There are no active problems to display for this patient.  [name], PT, DPT 2015/09/20 9:36 AM  [facility] [address]  [address] [place], [place], [address] Phone: [phone]   Fax:  [phone]  Name: [name] MRN: [id number] Date of Birth: [birth year]

## 2015-09-03 ENCOUNTER — Ambulatory Visit: Payer: No Typology Code available for payment source | Admitting: Physical Therapy

## 2015-09-03 DIAGNOSIS — M25612 Stiffness of left shoulder, not elsewhere classified: Secondary | ICD-10-CM

## 2015-09-03 DIAGNOSIS — M25512 Pain in left shoulder: Secondary | ICD-10-CM

## 2015-09-03 DIAGNOSIS — R293 Abnormal posture: Secondary | ICD-10-CM

## 2015-09-03 NOTE — Therapy (Signed)
[facility] [address]  [address] [place], [place], [address] Phone: [phone]   Fax:  [phone]  Physical Therapy Treatment  Patient Details  Name: Kyle CapriRandall Knight MRN: [id number] Date of Birth: 04/01/1950 Referring Provider: Dr Jodi GeraldsJohn Graves  Encounter Date: 09/03/2015      PT End of Session - 09/03/15 0823    Visit Number 2   Number of Visits 16   Date for PT Re-Evaluation 10/25/15   Authorization Type VA - 35 visits authorized (no PTA)   PT Start Time 0744   PT Stop Time 0837   PT Time Calculation (min) 53 min   Activity Tolerance Patient tolerated treatment well   Behavior [facility] for tasks assessed/performed      Past Medical History  Diagnosis Date  . Hypertension   . Depression     Past Surgical History  Procedure Laterality Date  . 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 Colon surgery  2003    diverticulitis  . Carpal tunnel release Bilateral 2005  . Shoulder arthroscopy Left 06/25/2015    Procedure: ARTHROSCOPY SHOULDER;  Surgeon: Jodi GeraldsJohn Graves, MD;  Location: [facility];  Service: Orthopedics;  Laterality: Left;  Left shoulder arthroscopy with subacromial decompression and distal clavicle resection.   . Shoulder open rotator cuff repair Left 06/25/2015    Procedure: ROTATOR CUFF REPAIR SHOULDER OPEN;  Surgeon: Jodi GeraldsJohn Graves, MD;  Location: [facility];  Service: Orthopedics;  Laterality: Left;    There were no vitals filed for this visit.      Subjective Assessment - 09/03/15 0746    Subjective doing well; not really having much pain.  Dr. Luiz BlareGraves impressed with progress    Patient Stated Goals improve motion and strength in arm, decrease pain   Currently in Pain? No/denies                         [facility]/Exercise - 09/03/15 0747    Shoulder Exercises: Supine   Protraction Left;15 reps;Weights   Protraction Weight (lbs)  5   Horizontal ABduction Both;15 reps;Theraband   Theraband Level (Shoulder Horizontal ABduction) Level 2 (Red)   External Rotation Both;15 reps;Theraband   Theraband Level (Shoulder External Rotation) Level 2 (Red)   Flexion Left;15 reps;Weights   Shoulder Flexion Weight (lbs) 2   Shoulder Exercises: Sidelying   External Rotation Left;15 reps;Weights   External Rotation Weight (lbs) 2   External Rotation Limitations min cues for technique   ABduction Left;15 reps;Weights   ABduction Weight (lbs) 1   ABduction Limitations mild catching sensation at ~ 90 degrees   Shoulder Exercises: Standing   Retraction Both;Theraband;15 reps   Theraband Level (Shoulder Retraction) Level 2 (Red)   Shoulder Exercises: Pulleys   Flexion 3 minutes   ABduction 3 minutes   Shoulder Exercises: ROM/Strengthening   UBE (Upper Arm Bike) L 4 x 6 min; 3' fwd/3' bwd   Cybex Row Limitations 25#; both hand holds x 15 each   Wall Pushups 10 reps   Pushups Limitations at counter   Modalities   Modalities Vasopneumatic   Vasopneumatic   Number Minutes Vasopneumatic  15 minutes   Vasopnuematic Location  Shoulder   Vasopneumatic Pressure Low   Vasopneumatic Temperature  max cold                  PT Short Term Goals - 08/30/15 10270925  PT SHORT TERM GOAL #1   Title verbalize understanding of posture/body mechanics to decrease risk of reinjury (09/27/15)   Time 4   Period Weeks   Status New   PT SHORT TERM GOAL #2   Title improve L shoulder AROM to WNL for improved function (09/27/15)   Time 4   Period Weeks   Status New           PT Long Term Goals - 08/30/15 0931    PT LONG TERM GOAL #1   Title independent with HEP (10/25/15)   Time 8   Period Weeks   Status New   PT LONG TERM GOAL #2   Title improve L shoulder strength to at least 4/5 for improved function (10/25/15)   Time 8   Period Weeks   Status New   PT LONG TERM GOAL #3   Title report ability to reach for items without increase  in pain (10/25/15)   Time 8   Period Weeks   Status New   PT LONG TERM GOAL #4   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  Min cues needed to decrease shoulder shrug but pt able to quickly demonstrate correct posture.  Will continue to benefit from PT to maximize function.   PT Next Visit Plan ROM, progress strengthening as able; posture exercises, modalities PRN; watch substitution patterns      Patient will benefit from skilled therapeutic intervention in order to improve the following deficits and impairments:     Visit Diagnosis: Pain in left shoulder  Stiffness of left shoulder, not elsewhere classified  Abnormal posture     Problem List There are no active problems to display for this patient.  [name], PT, DPT 09/03/2015 8:37 AM  [facility] [address]  [address] [place], [place], [address] Phone: [phone]   Fax:  [phone]  Name: [name] MRN: [id number] Date of Birth: Jan 04, 1951

## 2015-09-06 ENCOUNTER — Ambulatory Visit: Payer: No Typology Code available for payment source | Admitting: Physical Therapy

## 2015-09-06 DIAGNOSIS — M25512 Pain in left shoulder: Secondary | ICD-10-CM | POA: Diagnosis not present

## 2015-09-06 DIAGNOSIS — R293 Abnormal posture: Secondary | ICD-10-CM

## 2015-09-06 DIAGNOSIS — M25612 Stiffness of left shoulder, not elsewhere classified: Secondary | ICD-10-CM

## 2015-09-06 NOTE — Patient Instructions (Signed)
Strengthening: Resisted Internal Rotation   Hold tubing in left hand, elbow at side and forearm out. Rotate forearm in across body. Repeat _15___ times per set. Do _1___ sets per session. Do __2-3__ sessions per day.  http://orth.exer.us/830   Copyright  VHI. All rights reserved.  Strengthening: Resisted External Rotation   Hold tubing in left hand, elbow at side and forearm across body. Rotate forearm out. Repeat _15___ times per set. Do _1___ sets per session. Do __2-3__ sessions per day.  http://orth.exer.us/828   Copyright  VHI. All rights reserved.  Strengthening: Resisted Flexion   Hold tubing with left arm at side. Pull forward and up. Move shoulder through pain-free range of motion. Repeat __15__ times per set. Do _1___ sets per session. Do _2-3___ sessions per day.  http://orth.exer.us/824   Copyright  VHI. All rights reserved.  Strengthening: Resisted Extension   Hold tubing in left hand, arm forward. Pull arm back, elbow straight. Repeat __15__ times per set. Do _1___ sets per session. Do __2-3__ sessions per day.  http://orth.exer.us/832   Copyright  VHI. All rights reserved.    

## 2015-09-06 NOTE — Therapy (Signed)
[facility] [address]  [address] [place], [place], [address] Phone: [phone]   Fax:  [phone]  Physical Therapy Treatment  Patient Details  Name: Kyle CapriRandall Knight MRN: [id number] Date of Birth: [birth year] Referring Provider: Dr Jodi GeraldsJohn Graves  Encounter Date: 09/06/2015      PT End of Session - 09/06/15 1509    Visit Number 3   Number of Visits 16   Date for PT Re-Evaluation 10/25/15   Authorization Type VA - 35 visits authorized (no PTA)   PT Start Time 1421   PT Stop Time 1522   PT Time Calculation (min) 61 min   Activity Tolerance Patient tolerated treatment well   Behavior During Therapy [facility] for tasks assessed/performed      Past Medical History  Diagnosis Date  . Hypertension   . Depression     Past Surgical History  Procedure Laterality Date  . 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 Colon surgery  2003    diverticulitis  . Carpal tunnel release Bilateral 2005  . Shoulder arthroscopy Left 06/25/2015    Procedure: ARTHROSCOPY SHOULDER;  Surgeon: Jodi GeraldsJohn Graves, MD;  Location: [facility];  Service: Orthopedics;  Laterality: Left;  Left shoulder arthroscopy with subacromial decompression and distal clavicle resection.   . Shoulder open rotator cuff repair Left 06/25/2015    Procedure: ROTATOR CUFF REPAIR SHOULDER OPEN;  Surgeon: Jodi GeraldsJohn Graves, MD;  Location: [facility];  Service: Orthopedics;  Laterality: Left;    There were no vitals filed for this visit.      Subjective Assessment - 09/06/15 1418    Subjective shoulder is feeling good; a little sore this morning but thinks it may have been how he slept.   Pertinent History depression, HTN   Patient Stated Goals improve motion and strength in arm, decrease pain   Currently in Pain? No/denies                         [facility] Treatment/Exercise - 09/06/15 1424    Shoulder Exercises: Supine   Flexion 15 reps;Weights   Shoulder Flexion Weight (lbs) 5   Flexion Limitations with cane   Other Supine Exercises chest press with cane x15; 5#   Shoulder Exercises: Standing   Protraction Left;15 reps;Theraband   Theraband Level (Shoulder Protraction) Level 3 (Green)   External Rotation Left;15 reps;Theraband   Theraband Level (Shoulder External Rotation) Level 3 (Green)   Internal Rotation Left;15 reps;Theraband   Theraband Level (Shoulder Internal Rotation) Level 3 (Green)   Flexion Left;15 reps   Flexion Limitations from counter height to cabinet height x 10 first shelf; x 5 second shelf   ABduction Left;15 reps   ABduction Limitations from counter height to cabinet height   Extension Both;15 reps;Theraband   Theraband Level (Shoulder Extension) Level 3 (Green)   Retraction Both;Theraband;15 reps   Theraband Level (Shoulder Retraction) Level 3 (Green)   Shoulder Exercises: Pulleys   Flexion 3 minutes   ABduction 3 minutes   Other Pulley Exercises internal rotation x 3 min   Shoulder Exercises: ROM/Strengthening   UBE (Upper Arm Bike) L 4 x 8 min; 4' fwd/4' bwd   Wall Wash 2# flexion, horizontal abduct/adduct, circles x 15 each   Wall Pushups 20 reps   Pushups Limitations at counter   Shoulder Exercises: Body Blade   Flexion 30 seconds   ABduction 30 seconds   Other  Body Blade Exercises horizontal abduction x 3 reps   Modalities   Modalities Vasopneumatic   Vasopneumatic   Number Minutes Vasopneumatic  15 minutes   Vasopnuematic Location  Shoulder   Vasopneumatic Pressure Low   Vasopneumatic Temperature  max cold                PT Education - 09/06/15 1509    Education provided Yes   Education Details HEP   Person(s) Educated Patient   Methods Explanation;Demonstration;Handout   Comprehension Verbalized understanding;Returned demonstration;Need further instruction          PT Short Term Goals - 08/30/15 0925    PT SHORT TERM GOAL #1   Title verbalize  understanding of posture/body mechanics to decrease risk of reinjury (09/27/15)   Time 4   Period Weeks   Status New   PT SHORT TERM GOAL #2   Title improve L shoulder AROM to WNL for improved function (09/27/15)   Time 4   Period Weeks   Status New           PT Long Term Goals - 08/30/15 0931    PT LONG TERM GOAL #1   Title independent with HEP (10/25/15)   Time 8   Period Weeks   Status New   PT LONG TERM GOAL #2   Title improve L shoulder strength to at least 4/5 for improved function (10/25/15)   Time 8   Period Weeks   Status New   PT LONG TERM GOAL #3   Title report ability to reach for items without increase in pain (10/25/15)   Time 8   Period Weeks   Status New   PT LONG TERM GOAL #4   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  Will continue to benefit from PT to improve strength and function.   PT Next Visit Plan measure ROM, progress strengthening as able; posture exercises, modalities PRN; watch substitution patterns   Consulted and Agree with Plan of Care Patient      Patient will benefit from skilled therapeutic intervention in order to improve the following deficits and impairments:     Visit Diagnosis: Pain in left shoulder  Stiffness of left shoulder, not elsewhere classified  Abnormal posture     Problem List There are no active problems to display for this patient.  [name], PT, DPT 09/06/2015 3:24 PM  [facility] [address]  [address] [place], [place], [address] Phone: [phone]   Fax:  [phone]  Name: [name] MRN: [id number] Date of Birth:  1951/02/06

## 2015-09-09 ENCOUNTER — Ambulatory Visit: Payer: No Typology Code available for payment source | Admitting: Physical Therapy

## 2015-09-09 DIAGNOSIS — M25612 Stiffness of left shoulder, not elsewhere classified: Secondary | ICD-10-CM

## 2015-09-09 DIAGNOSIS — R293 Abnormal posture: Secondary | ICD-10-CM

## 2015-09-09 DIAGNOSIS — M25512 Pain in left shoulder: Secondary | ICD-10-CM | POA: Diagnosis not present

## 2015-09-09 NOTE — Therapy (Signed)
[facility] [address]  [address] [place], [place], [address] Phone: [phone]   Fax:  [phone]  Physical Therapy Treatment  Patient Details  Name: Kyle CapriRandall Knight MRN: [id number] Date of Birth: February 15, 1951 Referring Provider: Dr Jodi GeraldsJohn Graves  Encounter Date: 09/09/2015      PT End of Session - 09/09/15 1609    Visit Number 4   Number of Visits 16   Date for PT Re-Evaluation 10/25/15   Authorization Type VA - 35 visits authorized (no PTA)   PT Start Time 1527   PT Stop Time 1615   PT Time Calculation (min) 48 min   Activity Tolerance Patient tolerated treatment well   Behavior During Therapy [facility] for tasks assessed/performed      Past Medical History  Diagnosis Date  . Hypertension   . Depression     Past Surgical History  Procedure Laterality Date  . 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 Colon surgery  2003    diverticulitis  . Carpal tunnel release Bilateral 2005  . Shoulder arthroscopy Left 06/25/2015    Procedure: ARTHROSCOPY SHOULDER;  Surgeon: Jodi GeraldsJohn Graves, MD;  Location: [facility];  Service: Orthopedics;  Laterality: Left;  Left shoulder arthroscopy with subacromial decompression and distal clavicle resection.   . Shoulder open rotator cuff repair Left 06/25/2015    Procedure: ROTATOR CUFF REPAIR SHOULDER OPEN;  Surgeon: Jodi GeraldsJohn Graves, MD;  Location: [facility];  Service: Orthopedics;  Laterality: Left;    There were no vitals filed for this visit.      Subjective Assessment - 09/09/15 1529    Subjective went to personal trainer on tuesday - did more weight than in here; really sore from that.  "I'm not going to do that again"   Pertinent History depression, HTN   Patient Stated Goals improve motion and strength in arm, decrease pain   Currently in Pain? No/denies   Pain Location Shoulder   Pain Orientation Left   Pain Descriptors / Indicators Sore             [facility] PT Assessment - 09/09/15 1533    AROM   Left Shoulder Extension 65 Degrees   Left Shoulder Flexion 153 Degrees   Left Shoulder ABduction 155 Degrees   Left Shoulder Internal Rotation 85 Degrees   Left Shoulder External Rotation 66 Degrees                     [facility] Adult PT Treatment/Exercise - 09/09/15 1530    Shoulder Exercises: Prone   Retraction Left;20 reps;Weights   Retraction Weight (lbs) 5   Flexion Left;20 reps;Weights   Flexion Weight (lbs) 2   Extension Left;20 reps;Weights   Extension Weight (lbs) 5   Horizontal ABduction 1 Left;20 reps;Weights   Horizontal ABduction 1 Weight (lbs) 2   Shoulder Exercises: Standing   Flexion Left;20 reps;Weights   Shoulder Flexion Weight (lbs) 3   ABduction Left;20 reps;Weights   Shoulder ABduction Weight (lbs) 3   Shoulder Exercises: ROM/Strengthening   UBE (Upper Arm Bike) L 4 x 8 min; 4' fwd/4' bwd   Cybex Row Limitations 35# 2x10 both grips   Vasopneumatic   Number Minutes Vasopneumatic  15 minutes   Vasopnuematic Location  Shoulder   Vasopneumatic Pressure Low   Vasopneumatic Temperature  max cold                  PT Short Term  Goals - 09/09/15 1612    PT SHORT TERM GOAL #1   Title verbalize understanding of posture/body mechanics to decrease risk of reinjury (09/27/15)   Status On-going   PT SHORT TERM GOAL #2   Title improve L shoulder AROM to WNL for improved function (09/27/15)   Status Achieved           PT Long Term Goals - 08/30/15 0931    PT LONG TERM GOAL #1   Title independent with HEP (10/25/15)   Time 8   Period Weeks   Status New   PT LONG TERM GOAL #2   Title improve L shoulder strength to at least 4/5 for improved function (10/25/15)   Time 8   Period Weeks   Status New   PT LONG TERM GOAL #3   Title report ability to reach for items without increase in pain (10/25/15)   Time 8   Period Weeks   Status New   PT LONG TERM GOAL #4   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  Tolerated strength exercises well today.  AROM of L shoulder improved to WNL.   PT Next Visit Plan progress strengthening as able; posture exercises, modalities PRN; watch substitution patterns      Patient will benefit from skilled therapeutic intervention in order to improve the following deficits and impairments:     Visit Diagnosis: Pain in left shoulder  Stiffness of left shoulder, not elsewhere classified  Abnormal posture     Problem List There are no active problems to display for this patient.  [name], PT, DPT 09/09/2015 4:15 PM  [facility] [address]  [address] [place], [place], [address] Phone: [phone]   Fax:  [phone]  Name: [name] MRN: [id number] Date of Birth: 06/07/1950

## 2015-09-13 ENCOUNTER — Ambulatory Visit: Payer: No Typology Code available for payment source | Admitting: Physical Therapy

## 2015-09-16 ENCOUNTER — Ambulatory Visit: Payer: No Typology Code available for payment source | Admitting: Physical Therapy

## 2015-09-20 ENCOUNTER — Ambulatory Visit: Payer: No Typology Code available for payment source | Admitting: Physical Therapy

## 2015-09-20 VITALS — BP 178/100

## 2015-09-20 DIAGNOSIS — R293 Abnormal posture: Secondary | ICD-10-CM

## 2015-09-20 DIAGNOSIS — M25612 Stiffness of left shoulder, not elsewhere classified: Secondary | ICD-10-CM

## 2015-09-20 DIAGNOSIS — M25512 Pain in left shoulder: Secondary | ICD-10-CM

## 2015-09-20 NOTE — Therapy (Signed)
[facility] [address]  [address] [place], [place], [address] Phone: [phone]   Fax:  [phone]  Physical Therapy Treatment  Patient Details  Name: [name] MRN: [id number] Date of Birth: 08-Dec-1950 Referring Provider: Dr Jodi GeraldsJohn Graves  Encounter Date: 09/20/2015      PT End of Session - 09/20/15 1616    Visit Number 5   Number of Visits 16   Date for PT Re-Evaluation 10/25/15   Authorization Type VA - 35 visits authorized (no PTA)   PT Start Time 1616   PT Stop Time 1708   PT Time Calculation (min) 52 min   Activity Tolerance Patient tolerated treatment well   Behavior During Therapy [facility] for tasks assessed/performed      Past Medical History  Diagnosis Date  . Hypertension   . Depression     Past Surgical History  Procedure Laterality Date  . 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 Colon surgery  2003    diverticulitis  . Carpal tunnel release Bilateral 2005  . Shoulder arthroscopy Left 06/25/2015    Procedure: ARTHROSCOPY SHOULDER;  Surgeon: Jodi GeraldsJohn Graves, MD;  Location: [facility];  Service: Orthopedics;  Laterality: Left;  Left shoulder arthroscopy with subacromial decompression and distal clavicle resection.   . Shoulder open rotator cuff repair Left 06/25/2015    Procedure: ROTATOR CUFF REPAIR SHOULDER OPEN;  Surgeon: Jodi GeraldsJohn Graves, MD;  Location: [facility];  Service: Orthopedics;  Laterality: Left;    Filed Vitals:   09/20/15 1624 09/20/15 1630 09/20/15 1639 09/20/15 1651  BP: 182/96 174/90 170/94 178/100        Subjective Assessment - 09/20/15 1620    Subjective Pt reports he missed his last couple PT sessions due to an allergic reaction to a new BP meds he had been started on, which has since been discontinued.   Patient Stated Goals improve motion and strength in arm, decrease pain   Currently in Pain? No/denies               [facility]  Treatment/Exercise - 09/20/15 1616    Shoulder Exercises: Supine   Protraction Left;15 reps;Weights   Protraction Weight (lbs) 5   Other Supine Exercises Shoulder circles CW/CCW 5# x15 each   Shoulder Exercises: Prone   Retraction Left;20 reps;Weights   Retraction Weight (lbs) 5   Flexion Left;20 reps;Weights   Flexion Weight (lbs) 2   Extension Left;20 reps;Weights   Extension Weight (lbs) 5   Horizontal ABduction 1 Left;20 reps;Weights   Horizontal ABduction 1 Weight (lbs) 2   Shoulder Exercises: Standing   External Rotation Left;15 reps;Theraband   Theraband Level (Shoulder External Rotation) Level 3 (Green)   Internal Rotation Left;15 reps;Theraband   Theraband Level (Shoulder Internal Rotation) Level 3 (Green)   Extension Both;15 reps;Theraband   Theraband Level (Shoulder Extension) Level 3 (Green)   Retraction Both;Theraband;15 reps   Theraband Level (Shoulder Retraction) Level 3 (Green)   Shoulder Exercises: ROM/Strengthening   UBE (Upper Arm Bike) L 4 x 6 min; 3' fwd/3' bwd   Shoulder Exercises: Body Blade   Flexion 30 seconds   Flexion Limitations at 90 dg   ABduction 30 seconds   ABduction Limitations at 90 dg   Other Body Blade Exercises horizontal abduction x 4 reps   Other Body Blade Exercises flexion 0-90 dg x 4   Modalities   Modalities Vasopneumatic   Vasopneumatic   Number Minutes  Vasopneumatic  15 minutes   Vasopnuematic Location  Shoulder   Vasopneumatic Pressure Low   Vasopneumatic Temperature  max cold                PT Short Term Goals - 09/09/15 1612    PT SHORT TERM GOAL #1   Title verbalize understanding of posture/body mechanics to decrease risk of reinjury (09/27/15)   Status On-going   PT SHORT TERM GOAL #2   Title improve L shoulder AROM to WNL for improved function (09/27/15)   Status Achieved           PT Long Term Goals - 09/20/15 1627    PT LONG TERM GOAL #1   Title independent with HEP (10/25/15)   Time 8   Period Weeks    Status On-going   PT LONG TERM GOAL #2   Title improve L shoulder strength to at least 4/5 for improved function (10/25/15)   Time 8   Period Weeks   Status On-going   PT LONG TERM GOAL #3   Title report ability to reach for items without increase in pain (10/25/15)   Time 8   Period Weeks   Status On-going   PT LONG TERM GOAL #4   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 BP monitored t/o visit with therapy adjusted according to readings, most elevated after warm-up on UBE and creeping back up toward end of visit, therefore session ended early.   PT Treatment/Interventions ADLs/Self Care Home Management;Electrical Stimulation;Cryotherapy;Moist Heat;Ultrasound;Patient/family education;Neuromuscular re-education;Therapeutic exercise;Therapeutic activities;Functional mobility training;Passive range of motion;Manual techniques;Vasopneumatic Device   PT Next Visit Plan progress strengthening as able; posture exercises, modalities PRN; watch substitution patterns   Consulted and Agree with Plan of Care Patient      Patient will benefit from skilled therapeutic intervention in order to improve the following deficits and impairments:  Decreased strength, Impaired UE functional use, Pain, Decreased range of motion, Postural dysfunction  Visit Diagnosis: Pain in left shoulder  Stiffness of left shoulder, not elsewhere classified  Abnormal posture     Problem List There are no active problems to display for this patient.   Marry GuanJoAnne M Derrel Moore, PT, MPT 09/20/2015, 5:09 PM  [facility] [place] [address]  [address] [place], [place], [address] Phone: [phone]   Fax:  [phone]  Name: [name] MRN: [id number] Date of Birth: 1950/10/12

## 2015-09-23 ENCOUNTER — Ambulatory Visit: Payer: No Typology Code available for payment source

## 2015-09-27 ENCOUNTER — Ambulatory Visit: Payer: No Typology Code available for payment source | Attending: Orthopedic Surgery | Admitting: Physical Therapy

## 2015-09-27 VITALS — BP 155/100

## 2015-09-27 DIAGNOSIS — M25512 Pain in left shoulder: Secondary | ICD-10-CM | POA: Diagnosis not present

## 2015-09-27 DIAGNOSIS — R293 Abnormal posture: Secondary | ICD-10-CM | POA: Insufficient documentation

## 2015-09-27 DIAGNOSIS — M25612 Stiffness of left shoulder, not elsewhere classified: Secondary | ICD-10-CM | POA: Insufficient documentation

## 2015-09-27 NOTE — Patient Instructions (Signed)

## 2015-09-27 NOTE — Therapy (Signed)
[facility] [place] [address]  [place], [place], [address] Phone: [phone]   Fax:  [phone]  Physical Therapy Treatment  Patient Details  Name: [name] MRN: [id number] Date of Birth: 02-01-1951 Referring Provider: Dr Dorna Leitz  Encounter Date: 09/27/2015      PT End of Session - 09/27/15 1610    Visit Number 6   Number of Visits 16   Date for PT Re-Evaluation 10/25/15   Authorization Type VA - 35 visits authorized (no PTA)   PT Start Time 1531   PT Stop Time 1614   PT Time Calculation (min) 43 min   Activity Tolerance Patient tolerated treatment well   Behavior During Therapy [facility] for tasks assessed/performed      Past Medical History  Diagnosis Date  . Hypertension   . Depression     Past Surgical History  Procedure Laterality Date  . 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 Colon surgery  2003    diverticulitis  . Carpal tunnel release Bilateral 2005  . Shoulder arthroscopy Left 06/25/2015    Procedure: ARTHROSCOPY SHOULDER;  Surgeon: Dorna Leitz, MD;  Location: [place];  Service: Orthopedics;  Laterality: Left;  Left shoulder arthroscopy with subacromial decompression and distal clavicle resection.   . Shoulder open rotator cuff repair Left 06/25/2015    Procedure: ROTATOR CUFF REPAIR SHOULDER OPEN;  Surgeon: Dorna Leitz, MD;  Location: [place];  Service: Orthopedics;  Laterality: Left;    Filed Vitals:   09/27/15 1531 09/27/15 1540 09/27/15 1557  BP: 150/98 152/101 155/100        Subjective Assessment - 09/27/15 1529    Subjective started a new BP med - had reaction to this and then started on new med.  forgot the name of it but will bring it next time.  shoulder feels good.   Patient Stated Goals improve motion and strength in arm, decrease pain   Currently in Pain? No/denies            [facility] PT Assessment - 09/27/15 1549    AROM   Left  Shoulder Extension 60 Degrees   Left Shoulder Flexion 157 Degrees   Left Shoulder ABduction 155 Degrees   Left Shoulder Internal Rotation 85 Degrees   Left Shoulder External Rotation 74 Degrees                     [facility] Adult PT Treatment/Exercise - 09/27/15 1533    Shoulder Exercises: Supine   Protraction Left;20 reps;Weights   Protraction Weight (lbs) 5   Other Supine Exercises Shoulder circles CW/CCW 5# x20 each   Shoulder Exercises: ROM/Strengthening   UBE (Upper Arm Bike) L 4 x 6 min; 3' fwd/3' bwd   Rhythmic Stabilization, Supine ant/post, laterally and varied x 45 sec each   Other ROM/Strengthening Exercises TRX: chest press x15; row x 15, deltoid raise x 15   Vasopneumatic   Number Minutes Vasopneumatic  10 minutes   Vasopnuematic Location  Shoulder   Vasopneumatic Pressure Low   Vasopneumatic Temperature  max cold                PT Education - 09/27/15 1609    Education provided Yes   Education Details posture/body International aid/development worker) Educated Patient   Methods Explanation;Handout   Comprehension Verbalized understanding          PT Short Term Goals - 09/27/15 1611  PT SHORT TERM GOAL #1   Title verbalize understanding of posture/body mechanics to decrease risk of reinjury (09/27/15)   Status Achieved   PT SHORT TERM GOAL #2   Title improve L shoulder AROM to WNL for improved function (09/27/15)   Status Achieved           PT Long Term Goals - 09/20/15 1627    PT LONG TERM GOAL #1   Title independent with HEP (10/25/15)   Time 8   Period Weeks   Status On-going   PT LONG TERM GOAL #2   Title improve L shoulder strength to at least 4/5 for improved function (10/25/15)   Time 8   Period Weeks   Status On-going   PT LONG TERM GOAL #3   Title report ability to reach for items without increase in pain (10/25/15)   Time 8   Period Weeks   Status On-going   PT LONG TERM GOAL #4   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  All STGs met and pt progressing well towards LTGs.  Will continue to benefit from PT to maximize function.  BP elevated and monitored throughout session.   PT Next Visit Plan progress strengthening as able; posture exercises, modalities PRN; watch substitution patterns   Consulted and Agree with Plan of Care Patient      Patient will benefit from skilled therapeutic intervention in order to improve the following deficits and impairments:  Decreased strength, Impaired UE functional use, Pain, Decreased range of motion, Postural dysfunction  Visit Diagnosis: Pain in left shoulder  Stiffness of left shoulder, not elsewhere classified  Abnormal posture     Problem List There are no active problems to display for this patient.  Laureen Abrahams, PT, DPT 09/27/2015 4:14 PM  [facility] [place] [address]  [place], [place], [address] Phone: [phone]   Fax:  [phone]  Name: Zeyad Delaguila MRN: [id number] Date of Birth: Dec 12, 1950

## 2015-09-30 ENCOUNTER — Ambulatory Visit: Payer: No Typology Code available for payment source | Admitting: Physical Therapy

## 2015-10-04 ENCOUNTER — Ambulatory Visit: Payer: No Typology Code available for payment source | Admitting: Physical Therapy

## 2015-10-04 VITALS — BP 158/98

## 2015-10-04 DIAGNOSIS — M25512 Pain in left shoulder: Secondary | ICD-10-CM | POA: Diagnosis not present

## 2015-10-04 DIAGNOSIS — M25612 Stiffness of left shoulder, not elsewhere classified: Secondary | ICD-10-CM

## 2015-10-04 DIAGNOSIS — R293 Abnormal posture: Secondary | ICD-10-CM

## 2015-10-04 NOTE — Therapy (Signed)
[facility] [address]  [address] [place], [place], [address] Phone: [phone]   Fax:  [phone]  Physical Therapy Treatment  Patient Details  Name: [name] MRN: [id number] Date of Birth: 07-27-1950 Referring Provider: Dr Jodi Geralds  Encounter Date: 10/04/2015      PT End of Session - 10/04/15 1536    Visit Number 7   Number of Visits 16   Date for PT Re-Evaluation 10/25/15   Authorization Type VA - 35 visits authorized (no PTA)   PT Start Time 1536   PT Stop Time 1624   PT Time Calculation (min) 48 min   Activity Tolerance Patient tolerated treatment well   Behavior During Therapy [facility] for tasks assessed/performed      Past Medical History  Diagnosis Date  . Hypertension   . Depression     Past Surgical History  Procedure Laterality Date  . 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 Colon surgery  2003    diverticulitis  . Carpal tunnel release Bilateral 2005  . Shoulder arthroscopy Left 06/25/2015    Procedure: ARTHROSCOPY SHOULDER;  Surgeon: Jodi Geralds, MD;  Location: [facility];  Service: Orthopedics;  Laterality: Left;  Left shoulder arthroscopy with subacromial decompression and distal clavicle resection.   . Shoulder open rotator cuff repair Left 06/25/2015    Procedure: ROTATOR CUFF REPAIR SHOULDER OPEN;  Surgeon: Jodi Geralds, MD;  Location: [facility];  Service: Orthopedics;  Laterality: Left;    Filed Vitals:   10/04/15 1540 10/04/15 1551  BP: 144/98 158/98        Subjective Assessment - 10/04/15 1542    Subjective Pt reports they are still working on his BP meds but he forgot to bring the current one to update the med list. Denies pain but states he still wakes up stiff in the morning.   Patient Stated Goals improve motion and strength in arm, decrease pain   Currently in Pain? No/denies            [facility] PT Assessment - 10/04/15 1536    Assessment   Next MD Visit 10/14/15               [facility] Adult PT Treatment/Exercise - 10/04/15 1536    Shoulder Exercises: Standing   Other Standing Exercises L D2 flexion & D1 extension diagonals with green TB x15 each   Other Standing Exercises "W" scapular retraction with green TB x15   Shoulder Exercises: ROM/Strengthening   UBE (Upper Arm Bike) L 4 x 6 min; 3' fwd/3' bwd   Ball on Wall Shoulder circles with small ball on wall CW/CCW x10 each   Other ROM/Strengthening Exercises TRX: chest press x20; row x20, deltoid raise x20   Shoulder Exercises: Body Blade   Flexion 30 seconds   Flexion Limitations at 90 dg   ABduction 30 seconds   ABduction Limitations at 90 dg   Other Body Blade Exercises horizontal abduction x 4 reps   Other Body Blade Exercises flexion 0-90 dg x 4   Modalities   Modalities Vasopneumatic   Vasopneumatic   Number Minutes Vasopneumatic  10 minutes   Vasopnuematic Location  Shoulder   Vasopneumatic Pressure Low   Vasopneumatic Temperature  max cold                  PT Short Term Goals - 09/27/15 1611    PT SHORT TERM GOAL #1  Title verbalize understanding of posture/body mechanics to decrease risk of reinjury (09/27/15)   Status Achieved   PT SHORT TERM GOAL #2   Title improve L shoulder AROM to WNL for improved function (09/27/15)   Status Achieved           PT Long Term Goals - 09/20/15 1627    PT LONG TERM GOAL #1   Title independent with HEP (10/25/15)   Time 8   Period Weeks   Status On-going   PT LONG TERM GOAL #2   Title improve L shoulder strength to at least 4/5 for improved function (10/25/15)   Time 8   Period Weeks   Status On-going   PT LONG TERM GOAL #3   Title report ability to reach for items without increase in pain (10/25/15)   Time 8   Period Weeks   Status On-going   PT LONG TERM GOAL #4   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 Continues to note fatigue with exercises but no pain other than slight discomfort with D2 flexion diagonal.   PT Next Visit Plan progress strengthening as able; posture exercises, modalities PRN; watch substitution patterns   Consulted and Agree with Plan of Care Patient      Patient will benefit from skilled therapeutic intervention in order to improve the following deficits and impairments:  Decreased strength, Impaired UE functional use, Pain, Decreased range of motion, Postural dysfunction  Visit Diagnosis: Pain in left shoulder  Stiffness of left shoulder, not elsewhere classified  Abnormal posture     Problem List There are no active problems to display for this patient.   Marry GuanJoAnne M Kreis, PT, MPT 10/04/2015, 4:24 PM  [facility] [place] [address]  [address] [place], [place], [address] Phone: [phone]   Fax:  [phone]  Name: [name] MRN: [id number] Date of Birth: 09-28-1950

## 2015-10-11 ENCOUNTER — Ambulatory Visit: Payer: No Typology Code available for payment source | Admitting: Physical Therapy

## 2015-10-11 DIAGNOSIS — M25612 Stiffness of left shoulder, not elsewhere classified: Secondary | ICD-10-CM

## 2015-10-11 DIAGNOSIS — M25512 Pain in left shoulder: Secondary | ICD-10-CM

## 2015-10-11 DIAGNOSIS — R293 Abnormal posture: Secondary | ICD-10-CM

## 2015-10-11 NOTE — Therapy (Signed)
[facility] [address]  [address] [place], [place], [address] Phone: [phone]   Fax:  [phone]  Physical Therapy Treatment  Patient Details  Name: Kyle CapriRandall Knight MRN: [id number] Date of Birth: 14-Jun-1950 Referring Provider: Dr Jodi GeraldsJohn Graves  Encounter Date: 10/11/2015      PT End of Session - 10/11/15 1533    Visit Number 8   Number of Visits 16   Date for PT Re-Evaluation 10/25/15   Authorization Type VA - 35 visits authorized (no PTA)   PT Start Time 1533   PT Stop Time 1624   PT Time Calculation (min) 51 min   Activity Tolerance Patient tolerated treatment well   Behavior During Therapy [facility] for tasks assessed/performed      Past Medical History  Diagnosis Date  . Hypertension   . Depression     Past Surgical History  Procedure Laterality Date  . 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 Colon surgery  2003    diverticulitis  . Carpal tunnel release Bilateral 2005  . Shoulder arthroscopy Left 06/25/2015    Procedure: ARTHROSCOPY SHOULDER;  Surgeon: Jodi GeraldsJohn Graves, MD;  Location: [facility];  Service: Orthopedics;  Laterality: Left;  Left shoulder arthroscopy with subacromial decompression and distal clavicle resection.   . Shoulder open rotator cuff repair Left 06/25/2015    Procedure: ROTATOR CUFF REPAIR SHOULDER OPEN;  Surgeon: Jodi GeraldsJohn Graves, MD;  Location: [facility];  Service: Orthopedics;  Laterality: Left;    There were no vitals filed for this visit.      Subjective Assessment - 10/11/15 1536    Subjective Pt noting increased stiffness over last few days w/o known trigger. No pain unless attempting to reach in certain directions.   Currently in Pain? No/denies                         [facility] Adult PT Treatment/Exercise - 10/11/15 1533    Shoulder Exercises: Supine   Horizontal ABduction Both;15 reps;Theraband   Theraband Level (Shoulder Horizontal  ABduction) Level 4 (Blue)   External Rotation Both;15 reps;Theraband   Theraband Level (Shoulder External Rotation) Level 4 (Blue)   Flexion Both;15 reps;Theraband   Theraband Level (Shoulder Flexion) Level 4 (Blue)   Shoulder Exercises: Standing   Other Standing Exercises L D2 flexion & D1 extension diagonals with green TB x15 each   Shoulder Exercises: ROM/Strengthening   Cybex Row Limitations 35# x15 both grips; 45# x15 low row   Other ROM/Strengthening Exercises TRX: chest press x20; row x20, deltoid raise x20   Other ROM/Strengthening Exercises BATCA Pull down 35# x10, 45# x10   Modalities   Modalities Vasopneumatic   Vasopneumatic   Number Minutes Vasopneumatic  15 minutes   Vasopnuematic Location  Shoulder   Vasopneumatic Pressure Low   Vasopneumatic Temperature  max cold   Manual Therapy   Manual Therapy Joint mobilization   Joint Mobilization Grade 3-4 A/P and inferior mobs to L shouler                  PT Short Term Goals - 09/27/15 1611    PT SHORT TERM GOAL #1   Title verbalize understanding of posture/body mechanics to decrease risk of reinjury (09/27/15)   Status Achieved   PT SHORT TERM GOAL #2   Title improve L shoulder AROM to WNL for improved function (09/27/15)   Status Achieved  PT Long Term Goals - 09/20/15 1627    PT LONG TERM GOAL #1   Title independent with HEP (10/25/15)   Time 8   Period Weeks   Status On-going   PT LONG TERM GOAL #2   Title improve L shoulder strength to at least 4/5 for improved function (10/25/15)   Time 8   Period Weeks   Status On-going   PT LONG TERM GOAL #3   Title report ability to reach for items without increase in pain (10/25/15)   Time 8   Period Weeks   Status On-going   PT LONG TERM GOAL #4   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 Continued strengthening progression with occasional cues to correct technique to avoid substitution. Pt too seee MD on Thursday, therefore will assess ROM/strength and goals at next visit.   PT Next Visit Plan MD note for appt on 10/14/15; progress strengthening as able; posture exercises, modalities PRN; watch substitution patterns   Consulted and Agree with Plan of Care Patient      Patient will benefit from skilled therapeutic intervention in order to improve the following deficits and impairments:  Decreased strength, Impaired UE functional use, Pain, Decreased range of motion, Postural dysfunction  Visit Diagnosis: Pain in left shoulder  Stiffness of left shoulder, not elsewhere classified  Abnormal posture     Problem List There are no active problems to display for this patient.   Marry Guan, PT, MPT 10/11/2015, 4:21 PM  [facility] [address]  [address] [place], [place], [address] Phone: [phone]   Fax:  [phone]  Name: [name] MRN: [id number] Date of Birth: June 29, 1950

## 2015-10-13 ENCOUNTER — Ambulatory Visit: Payer: No Typology Code available for payment source

## 2015-10-18 ENCOUNTER — Ambulatory Visit: Payer: No Typology Code available for payment source | Admitting: Physical Therapy

## 2015-10-18 DIAGNOSIS — R293 Abnormal posture: Secondary | ICD-10-CM

## 2015-10-18 DIAGNOSIS — M25612 Stiffness of left shoulder, not elsewhere classified: Secondary | ICD-10-CM

## 2015-10-18 DIAGNOSIS — M25512 Pain in left shoulder: Secondary | ICD-10-CM

## 2015-10-18 NOTE — Therapy (Signed)
[facility] [place] [address]  [place], [place], [address] Phone: [phone]   Fax:  [phone]  Physical Therapy Treatment  Patient Details  Name: [name] MRN: [id number] Date of Birth: 1950-10-15 Referring Provider: Dr Dorna Leitz  Encounter Date: 10/18/2015      PT End of Session - 10/18/15 1615    Visit Number 9   Number of Visits 16   Date for PT Re-Evaluation 10/25/15   Authorization Type VA - 35 visits authorized (no PTA)   PT Start Time 1615   PT Stop Time 1640   PT Time Calculation (min) 25 min   Activity Tolerance Patient tolerated treatment well   Behavior During Therapy [facility] for tasks assessed/performed      Past Medical History:  Diagnosis Date  . Depression   . Hypertension     Past Surgical History:  Procedure Laterality Date  . CARPAL TUNNEL RELEASE Bilateral 2005  . COLON SURGERY  2003   diverticulitis  . JOINT REPLACEMENT Bilateral 2012   knees  . SHOULDER ARTHROSCOPY Left 06/25/2015   Procedure: ARTHROSCOPY SHOULDER;  Surgeon: Dorna Leitz, MD;  Location: [facility];  Service: Orthopedics;  Laterality: Left;  Left shoulder arthroscopy with subacromial decompression and distal clavicle resection.   Marland Kitchen SHOULDER OPEN ROTATOR CUFF REPAIR Left 06/25/2015   Procedure: ROTATOR CUFF REPAIR SHOULDER OPEN;  Surgeon: Dorna Leitz, MD;  Location: [facility];  Service: Orthopedics;  Laterality: Left;    There were no vitals filed for this visit.      Subjective Assessment - 10/18/15 1616    Subjective Pt saw MD last week and has been released from MD's care with follow up only as needed. Notes stiffness today but denies pain.   Patient Stated Goals improve motion and strength in arm, decrease pain   Currently in Pain? No/denies            [facility] PT Assessment - 10/18/15 1615      Assessment   Medical Diagnosis L RTC repair   Referring Provider Dr Dorna Leitz   Onset Date/Surgical Date 06/25/15   Hand Dominance Right   Prior Therapy none     Observation/Other Assessments   Focus on Therapeutic Outcomes (FOTO)  Shoulder 93% (7% limitation)     AROM   Left Shoulder Extension 70 Degrees   Left Shoulder Flexion 171 Degrees   Left Shoulder ABduction 167 Degrees   Left Shoulder Internal Rotation 89 Degrees   Left Shoulder External Rotation 85 Degrees     Strength   Left Shoulder Flexion 5/5   Left Shoulder Extension 5/5   Left Shoulder ABduction 5/5   Left Shoulder Internal Rotation 5/5   Left Shoulder External Rotation 5/5   Left Elbow Flexion 5/5   Left Elbow Extension 5/5   Right Hand Grip (lbs) 87   Left Hand Grip (lbs) 79               [facility] Adult PT Treatment/Exercise - 10/18/15 1615      Shoulder Exercises: ROM/Strengthening   UBE (Upper Arm Bike) L 4 x 6 min; 3' fwd/3' bwd                PT Education - 10/18/15 1639    Education provided Yes   Education Details Verbal review of continued home/gym program   Person(s) Educated Patient   Methods Explanation   Comprehension Verbalized understanding  PT Short Term Goals - 09/27/15 1611      PT SHORT TERM GOAL #1   Title verbalize understanding of posture/body mechanics to decrease risk of reinjury (09/27/15)   Status Achieved     PT SHORT TERM GOAL #2   Title improve L shoulder AROM to WNL for improved function (09/27/15)   Status Achieved           PT Long Term Goals - 11-16-15 1630      PT LONG TERM GOAL #1   Title independent with HEP (10/25/15)   Status Achieved     PT LONG TERM GOAL #2   Title improve L shoulder strength to at least 4/5 for improved function (10/25/15)   Status Achieved     PT LONG TERM GOAL #3   Title report ability to reach for items without increase in pain (10/25/15)   Status Achieved     PT LONG TERM GOAL #4   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 Pt has demonstrated excellent progress with PT, with full L shoulder AROM and strength 5/5. Pt reports some occasional stiffness, but no limitations in everyday use of L UE and only slight discomfort when reaching behind and overhead.  All goals met for this episode and pt reports he has started working out at [employer] and will continue with HEP, therefore will proceed with discharge from PT at this time.   PT Next Visit Plan MD note for appt on 10/14/15; progress strengthening as able; posture exercises, modalities PRN; watch substitution patterns   Consulted and Agree with Plan of Care Patient      Patient will benefit from skilled therapeutic intervention in order to improve the following deficits and impairments:  Decreased strength, Impaired UE functional use, Pain, Decreased range of motion, Postural dysfunction  Visit Diagnosis: Pain in left shoulder  Stiffness of left shoulder, not elsewhere classified  Abnormal posture       G-Codes - Nov 16, 2015 1640    Functional Assessment Tool Used Shoulder FOTO = 93% (7% Impaired)   Functional Limitation Carrying, moving and handling objects   Carrying, Moving and Handling Objects Goal Status ([id number]) At least 20 percent but less than 40 percent impaired, limited or restricted   Carrying, Moving and Handling Objects Discharge Status [phone]) At least 1 percent but less than 20 percent impaired, limited or restricted      Problem List There are no active problems to display for this patient.   Percival Spanish, PT, MPT 11-16-2015, 4:44 PM  [facility] [address]  [place], [place], [address] Phone: [phone]   Fax:  [phone]  Name: [name] MRN: [id number] Date of Birth: [birth year]  PHYSICAL THERAPY DISCHARGE  SUMMARY  Visits from Start of Care: 9  Current functional level related to goals / functional outcomes:   Refer to above clinical impression    Remaining deficits:   As above    Education / Equipment:   HEP/gym program  Plan: Patient agrees to discharge.  Patient goals were met. Patient is being discharged due to meeting the stated rehab goals.  ?????    Percival Spanish, PT, MPT 11-16-15, 4:46 PM  [place]  Outpatient Rehabilitation [facility] [address]  [place], [place], [address] Phone: [phone]   Fax:  [phone]

## 2015-10-20 ENCOUNTER — Ambulatory Visit: Payer: No Typology Code available for payment source

## 2015-10-25 ENCOUNTER — Ambulatory Visit: Payer: No Typology Code available for payment source | Admitting: Physical Therapy

## 2017-10-05 DIAGNOSIS — M20011 Mallet finger of right finger(s): Secondary | ICD-10-CM | POA: Insufficient documentation

## 2018-01-22 ENCOUNTER — Other Ambulatory Visit: Payer: Self-pay | Admitting: Orthopedic Surgery

## 2018-01-22 DIAGNOSIS — M67911 Unspecified disorder of synovium and tendon, right shoulder: Secondary | ICD-10-CM

## 2018-01-22 DIAGNOSIS — M25511 Pain in right shoulder: Secondary | ICD-10-CM

## 2018-01-31 ENCOUNTER — Ambulatory Visit
Admission: RE | Admit: 2018-01-31 | Discharge: 2018-01-31 | Disposition: A | Discharge status: Home / Self Care | Payer: Medicare Other | Source: Ambulatory Visit | Attending: Orthopedic Surgery | Admitting: Orthopedic Surgery

## 2018-01-31 ENCOUNTER — Other Ambulatory Visit: Payer: Self-pay | Admitting: Orthopedic Surgery

## 2018-01-31 DIAGNOSIS — M25511 Pain in right shoulder: Secondary | ICD-10-CM

## 2018-01-31 DIAGNOSIS — M67911 Unspecified disorder of synovium and tendon, right shoulder: Secondary | ICD-10-CM

## 2018-02-01 ENCOUNTER — Ambulatory Visit
Admission: RE | Admit: 2018-02-01 | Discharge: 2018-02-01 | Disposition: A | Discharge status: Home / Self Care | Payer: Medicare Other | Source: Ambulatory Visit | Attending: Orthopedic Surgery | Admitting: Orthopedic Surgery

## 2018-02-01 DIAGNOSIS — M25511 Pain in right shoulder: Secondary | ICD-10-CM

## 2018-02-01 DIAGNOSIS — M67911 Unspecified disorder of synovium and tendon, right shoulder: Secondary | ICD-10-CM

## 2019-11-02 DIAGNOSIS — R7989 Other specified abnormal findings of blood chemistry: Secondary | ICD-10-CM | POA: Insufficient documentation

## 2019-11-02 DIAGNOSIS — F32A Depression, unspecified: Secondary | ICD-10-CM | POA: Insufficient documentation

## 2019-11-02 DIAGNOSIS — J9601 Acute respiratory failure with hypoxia: Secondary | ICD-10-CM | POA: Insufficient documentation

## 2019-11-02 DIAGNOSIS — E876 Hypokalemia: Secondary | ICD-10-CM | POA: Insufficient documentation

## 2019-11-02 DIAGNOSIS — D696 Thrombocytopenia, unspecified: Secondary | ICD-10-CM | POA: Insufficient documentation

## 2019-11-02 DIAGNOSIS — I10 Essential (primary) hypertension: Secondary | ICD-10-CM | POA: Insufficient documentation

## 2019-11-02 HISTORY — DX: Depression, unspecified: F32.A

## 2020-09-10 DIAGNOSIS — G4730 Sleep apnea, unspecified: Secondary | ICD-10-CM | POA: Insufficient documentation

## 2020-09-10 DIAGNOSIS — E538 Deficiency of other specified B group vitamins: Secondary | ICD-10-CM | POA: Insufficient documentation

## 2020-09-10 DIAGNOSIS — G43909 Migraine, unspecified, not intractable, without status migrainosus: Secondary | ICD-10-CM | POA: Insufficient documentation

## 2020-09-10 DIAGNOSIS — N400 Enlarged prostate without lower urinary tract symptoms: Secondary | ICD-10-CM | POA: Diagnosis present

## 2020-10-21 DIAGNOSIS — Z96651 Presence of right artificial knee joint: Secondary | ICD-10-CM | POA: Diagnosis not present

## 2020-10-21 DIAGNOSIS — M25561 Pain in right knee: Secondary | ICD-10-CM | POA: Diagnosis not present

## 2020-10-21 DIAGNOSIS — M25461 Effusion, right knee: Secondary | ICD-10-CM | POA: Diagnosis not present

## 2020-11-25 DIAGNOSIS — M25461 Effusion, right knee: Secondary | ICD-10-CM | POA: Diagnosis not present

## 2020-11-25 DIAGNOSIS — M25562 Pain in left knee: Secondary | ICD-10-CM | POA: Diagnosis not present

## 2020-11-25 DIAGNOSIS — Z96653 Presence of artificial knee joint, bilateral: Secondary | ICD-10-CM | POA: Diagnosis not present

## 2020-11-25 DIAGNOSIS — M25561 Pain in right knee: Secondary | ICD-10-CM | POA: Diagnosis not present

## 2020-12-30 DIAGNOSIS — M25461 Effusion, right knee: Secondary | ICD-10-CM | POA: Diagnosis not present

## 2021-01-05 ENCOUNTER — Other Ambulatory Visit: Payer: Self-pay | Admitting: Orthopedic Surgery

## 2021-01-06 ENCOUNTER — Other Ambulatory Visit (HOSPITAL_COMMUNITY): Payer: Self-pay | Admitting: Orthopedic Surgery

## 2021-01-06 ENCOUNTER — Other Ambulatory Visit: Payer: Self-pay | Admitting: Orthopedic Surgery

## 2021-01-06 DIAGNOSIS — M25561 Pain in right knee: Secondary | ICD-10-CM

## 2021-01-20 ENCOUNTER — Other Ambulatory Visit: Payer: Self-pay

## 2021-01-20 ENCOUNTER — Ambulatory Visit (HOSPITAL_COMMUNITY)
Admission: RE | Admit: 2021-01-20 | Discharge: 2021-01-20 | Disposition: A | Discharge status: Home / Self Care | Payer: Medicare Other | Source: Ambulatory Visit | Attending: Orthopedic Surgery | Admitting: Orthopedic Surgery

## 2021-01-20 ENCOUNTER — Encounter (HOSPITAL_COMMUNITY)
Admission: RE | Admit: 2021-01-20 | Discharge: 2021-01-20 | Disposition: A | Discharge status: Home / Self Care | Payer: Medicare Other | Source: Ambulatory Visit | Attending: Orthopedic Surgery | Admitting: Orthopedic Surgery

## 2021-01-20 DIAGNOSIS — M25561 Pain in right knee: Secondary | ICD-10-CM | POA: Diagnosis not present

## 2021-01-20 MED ORDER — TECHNETIUM TC 99M MEDRONATE IV KIT
21.9000 | PACK | Freq: Once | INTRAVENOUS | Status: AC | PRN
  Administered 2021-01-20: 21.9 via INTRAVENOUS

## 2021-01-27 DIAGNOSIS — Z96651 Presence of right artificial knee joint: Secondary | ICD-10-CM | POA: Diagnosis not present

## 2021-01-27 DIAGNOSIS — M25561 Pain in right knee: Secondary | ICD-10-CM | POA: Diagnosis not present

## 2021-02-03 DIAGNOSIS — Z96651 Presence of right artificial knee joint: Secondary | ICD-10-CM | POA: Diagnosis not present

## 2021-02-23 DIAGNOSIS — H52223 Regular astigmatism, bilateral: Secondary | ICD-10-CM | POA: Diagnosis not present

## 2021-02-23 DIAGNOSIS — H524 Presbyopia: Secondary | ICD-10-CM | POA: Diagnosis not present

## 2021-02-23 DIAGNOSIS — H35373 Puckering of macula, bilateral: Secondary | ICD-10-CM | POA: Diagnosis not present

## 2021-02-24 DIAGNOSIS — M25561 Pain in right knee: Secondary | ICD-10-CM | POA: Diagnosis not present

## 2021-03-29 ENCOUNTER — Other Ambulatory Visit: Payer: Self-pay | Admitting: Orthopedic Surgery

## 2021-04-07 ENCOUNTER — Other Ambulatory Visit: Payer: Self-pay | Admitting: Orthopedic Surgery

## 2021-04-14 DIAGNOSIS — H43811 Vitreous degeneration, right eye: Secondary | ICD-10-CM | POA: Diagnosis not present

## 2021-04-14 DIAGNOSIS — H35373 Puckering of macula, bilateral: Secondary | ICD-10-CM | POA: Diagnosis not present

## 2021-04-18 NOTE — Patient Instructions (Signed)
DUE TO COVID-19 ONLY ONE VISITOR IS ALLOWED TO COME WITH YOU AND STAY IN THE WAITING ROOM ONLY DURING PRE OP AND PROCEDURE.   **NO VISITORS ARE ALLOWED IN THE SHORT STAY AREA OR RECOVERY ROOM!!**  IF YOU WILL BE ADMITTED INTO THE HOSPITAL YOU ARE ALLOWED ONLY TWO SUPPORT PEOPLE DURING VISITATION HOURS ONLY (7 AM -8PM)   The support person(s) must pass our screening, gel in and out, and wear a mask at all times, including in the patients room. Patients must also wear a mask when staff or their support person are in the room. Visitors GUEST BADGE MUST BE WORN VISIBLY  One adult visitor may remain with you overnight and MUST be in the room by 8 P.M.  No visitors under the age of 8. Any visitor under the age of 1 must be accompanied by an adult.    COVID SWAB TESTING MUST BE COMPLETED ON: 04/22/21 @ 9:45 AM    Site: [facility] [address] [place] Enter: Main Entrance have a seat in the waiting area to the right of main entrance (DO NOT CHECK IN AT ADMITTING!!!!!) Dial: [phone] to alert staff you have arrived  You are not required to quarantine, however you are required to wear a well-fitted mask when you are out and around people not in your household.  Hand Hygiene often Do NOT share personal items Notify your provider if you are in close contact with someone who has COVID or you develop fever 100.4 or greater, new onset of sneezing, cough, sore throat, shortness of breath or body aches.  [facility] Entrance [address], [address], must go inside of the hospital, NOT A DRIVE THRU!  (Must self quarantine after testing. Follow instructions on handout.)       Your procedure is scheduled on: 04/25/21   Report to [facility] Main Entrance    Report to admitting at : 10:30 AM   Call this number if you have problems the morning of surgery [phone]   Do not eat food :After Midnight.   May have liquids  until : 10:00 AM   day of surgery  CLEAR LIQUID DIET  Foods Allowed                                                                     Foods Excluded  Water, Black Coffee and tea, regular and decaf                             liquids that you cannot  Plain Jell-O in any flavor  (No red)                                           see through such as: Fruit ices (not with fruit pulp)                                     milk, soups, orange juice  Iced Popsicles (No red)                                    All solid food                                   Apple juices Sports drinks like Gatorade (No red) Lightly seasoned clear broth or consume(fat free) Sugar Sample Menu Breakfast                                Lunch                                     Supper Cranberry juice                    Beef broth                            Chicken broth Jell-O                                     Grape juice                           Apple juice Coffee or tea                        Jell-O                                      Popsicle                                                Coffee or tea                        Coffee or tea    Complete one Ensure drink the morning of surgery 3 hours prior to scheduled surgery at 10:00 AM  The day of surgery:  Drink ONE (1) Pre-Surgery Clear Ensure or G2 at AM the morning of surgery. Drink in one sitting. Do not sip.  This drink was given to you during your hospital  pre-op appointment visit. Nothing else to drink after completing the  Pre-Surgery Clear Ensure or G2.          If you have questions, please contact your surgeons office.    Oral Hygiene is also important to reduce your risk of infection.                                    Remember - BRUSH YOUR TEETH THE MORNING OF SURGERY WITH YOUR REGULAR TOOTHPASTE   Do NOT smoke after Midnight   Take these medicines the morning of surgery with A SIP OF WATER: bupropion.Use  inhalers and Flonase as  usual.  DO NOT TAKE ANY ORAL DIABETIC MEDICATIONS DAY OF YOUR SURGERY                              You may not have any metal on your body including hair pins, jewelry, and body piercing             Do not wear  lotions, powders, perfumes/cologne, or deodorant.              Men may shave face and neck.   Do not bring valuables to the hospital. [facility] IS NOT             RESPONSIBLE   FOR VALUABLES.   Contacts, dentures or bridgework may not be worn into surgery.   Bring small overnight bag day of surgery.    Patients discharged on the day of surgery will not be allowed to drive home.  Someone needs to stay with you for the first 24 hours after anesthesia.   Special Instructions: Bring a copy of your healthcare power of attorney and living will documents         the day of surgery if you haven't scanned them before.              Please read over the following fact sheets you were given: IF YOU HAVE QUESTIONS ABOUT YOUR PRE-OP INSTRUCTIONS PLEASE CALL [phone]     [facility] - Preparing [facility] Before surgery, you can play an important role.  Because skin is not sterile, your skin needs to be as free of germs as possible.  You can reduce the number of germs on your skin by washing with CHG (chlorahexidine gluconate) soap before surgery.  CHG is an antiseptic cleaner which kills germs and bonds with the skin to continue killing germs even after washing. Please DO NOT use if you have an allergy to CHG or antibacterial soaps.  If your skin becomes reddened/irritated stop using the CHG and inform your nurse when you arrive at Short Stay. Do not shave (including legs and underarms) for at least 48 hours prior to the first CHG shower.  You may shave your face/neck. Please follow these instructions carefully:  1.  Shower with CHG Soap the night before surgery and the  morning of Surgery.  2.  If you choose to wash your hair, wash your hair first as usual with your  normal   shampoo.  3.  After you shampoo, rinse your hair and body thoroughly to remove the  shampoo.                           4.  Use CHG as you would any other liquid soap.  You can apply chg directly  to the skin and wash                       Gently with a scrungie or clean washcloth.  5.  Apply the CHG Soap to your body ONLY FROM THE NECK DOWN.   Do not use on face/ open                           Wound or open sores. Avoid contact with eyes, ears mouth and genitals (private parts).  Wash face,  Genitals (private parts) with your normal soap.             6.  Wash thoroughly, paying special attention to the area where your surgery  will be performed.  7.  Thoroughly rinse your body with warm water from the neck down.  8.  DO NOT shower/wash with your normal soap after using and rinsing off  the CHG Soap.                9.  Pat yourself dry with a clean towel.            10.  Wear clean pajamas.            11.  Place clean sheets on your bed the night of your first shower and do not  sleep with pets. Day of Surgery : Do not apply any lotions/deodorants the morning of surgery.  Please wear clean clothes to the hospital/surgery center.  FAILURE TO FOLLOW THESE INSTRUCTIONS MAY RESULT IN THE CANCELLATION OF YOUR SURGERY PATIENT SIGNATURE_________________________________  NURSE SIGNATURE__________________________________  ________________________________________________________________________   Adam Phenix  An incentive spirometer is a tool that can help keep your lungs clear and active. This tool measures how well you are filling your lungs with each breath. Taking long deep breaths may help reverse or decrease the chance of developing breathing (pulmonary) problems (especially infection) following: A long period of time when you are unable to move or be active. BEFORE THE PROCEDURE  If the spirometer includes an indicator to show your best effort, your nurse or  respiratory therapist will set it to a desired goal. If possible, sit up straight or lean slightly forward. Try not to slouch. Hold the incentive spirometer in an upright position. INSTRUCTIONS FOR USE  Sit on the edge of your bed if possible, or sit up as far as you can in bed or on a chair. Hold the incentive spirometer in an upright position. Breathe out normally. Place the mouthpiece in your mouth and seal your lips tightly around it. Breathe in slowly and as deeply as possible, raising the piston or the ball toward the top of the column. Hold your breath for 3-5 seconds or for as long as possible. Allow the piston or ball to fall to the bottom of the column. Remove the mouthpiece from your mouth and breathe out normally. Rest for a few seconds and repeat Steps 1 through 7 at least 10 times every 1-2 hours when you are awake. Take your time and take a few normal breaths between deep breaths. The spirometer may include an indicator to show your best effort. Use the indicator as a goal to work toward during each repetition. After each set of 10 deep breaths, practice coughing to be sure your lungs are clear. If you have an incision (the cut made at the time of surgery), support your incision when coughing by placing a pillow or rolled up towels firmly against it. Once you are able to get out of bed, walk around indoors and cough well. You may stop using the incentive spirometer when instructed by your caregiver.  RISKS AND COMPLICATIONS Take your time so you do not get dizzy or light-headed. If you are in pain, you may need to take or ask for pain medication before doing incentive spirometry. It is harder to take a deep breath if you are having pain. AFTER USE Rest and breathe slowly and easily. It can be helpful to keep track of  a log of your progress. Your caregiver can provide you with a simple table to help with this. If you are using the spirometer at home, follow these  instructions: Bath IF:  You are having difficultly using the spirometer. You have trouble using the spirometer as often as instructed. Your pain medication is not giving enough relief while using the spirometer. You develop fever of 100.5 F (38.1 C) or higher. SEEK IMMEDIATE MEDICAL CARE IF:  You cough up bloody sputum that had not been present before. You develop fever of 102 F (38.9 C) or greater. You develop worsening pain at or near the incision site. MAKE SURE YOU:  Understand these instructions. Will watch your condition. Will get help right away if you are not doing well or get worse. Document Released: 07/24/2006 Document Revised: 06/05/2011 Document Reviewed: 09/24/2006 [facility] Patient Information 2014 [place], [place].   ________________________________________________________________________

## 2021-04-19 ENCOUNTER — Ambulatory Visit (HOSPITAL_COMMUNITY)
Admission: RE | Admit: 2021-04-19 | Discharge: 2021-04-19 | Disposition: A | Discharge status: Home / Self Care | Payer: Medicare Other | Source: Ambulatory Visit | Attending: Orthopedic Surgery | Admitting: Orthopedic Surgery

## 2021-04-19 ENCOUNTER — Encounter (HOSPITAL_COMMUNITY)
Admission: RE | Admit: 2021-04-19 | Discharge: 2021-04-19 | Disposition: A | Discharge status: Home / Self Care | Payer: Medicare Other | Source: Ambulatory Visit | Attending: Orthopedic Surgery | Admitting: Orthopedic Surgery

## 2021-04-19 ENCOUNTER — Encounter (HOSPITAL_COMMUNITY): Payer: Self-pay

## 2021-04-19 ENCOUNTER — Other Ambulatory Visit: Payer: Self-pay

## 2021-04-19 VITALS — BP 158/92 | HR 68 | Temp 98.1°F | Ht 67.0 in | Wt 281.0 lb

## 2021-04-19 DIAGNOSIS — I2510 Atherosclerotic heart disease of native coronary artery without angina pectoris: Secondary | ICD-10-CM | POA: Diagnosis not present

## 2021-04-19 DIAGNOSIS — Z01818 Encounter for other preprocedural examination: Secondary | ICD-10-CM

## 2021-04-19 DIAGNOSIS — Z01811 Encounter for preprocedural respiratory examination: Secondary | ICD-10-CM

## 2021-04-19 LAB — CBC
HCT: 47.2 % (ref 39.0–52.0)
Hemoglobin: 15.3 g/dL (ref 13.0–17.0)
MCH: 30.8 pg (ref 26.0–34.0)
MCHC: 32.4 g/dL (ref 30.0–36.0)
MCV: 95.2 fL (ref 80.0–100.0)
Platelets: 219 10*3/uL (ref 150–400)
RBC: 4.96 MIL/uL (ref 4.22–5.81)
RDW: 14.7 % (ref 11.5–15.5)
WBC: 6.6 10*3/uL (ref 4.0–10.5)
nRBC: 0 % (ref 0.0–0.2)

## 2021-04-19 LAB — BASIC METABOLIC PANEL
Anion gap: 7 (ref 5–15)
BUN: 26 mg/dL — ABNORMAL HIGH (ref 8–23)
CO2: 31 mmol/L (ref 22–32)
Calcium: 9.6 mg/dL (ref 8.9–10.3)
Chloride: 103 mmol/L (ref 98–111)
Creatinine, Ser: 0.93 mg/dL (ref 0.61–1.24)
GFR, Estimated: >60 mL/min (ref >60)
Glucose, Bld: 103 mg/dL — ABNORMAL HIGH (ref 70–99)
Potassium: 3.8 mmol/L (ref 3.5–5.1)
Sodium: 141 mmol/L (ref 135–145)

## 2021-04-19 LAB — SURGICAL PCR SCREEN
MRSA, PCR: NEGATIVE
Staphylococcus aureus: NEGATIVE

## 2021-04-19 NOTE — Progress Notes (Addendum)
COVID Vaccine Completed: NO Date COVID Vaccine completed:  COVID vaccine manufacturer: [employer]  COVID Test: 04/22/21 @ 9:45 AM PCP - Dr. Rozelle Logan Cardiologist -   Chest x-ray -  EKG -  Stress Test -  ECHO - 11/03/19: [facility] Cardiac Cath -  Pacemaker/ICD device last checked:  Sleep Study - Yes CPAP - NO  Fasting Blood Sugar -  Checks Blood Sugar _____ times a day  Blood Thinner Instructions: Aspirin Instructions: Last Dose:  Anesthesia review: Hx: HTN,OSA(NO CPAP)  Patient denies shortness of breath, fever, cough and chest pain at PAT appointment   Patient verbalized understanding of instructions that were given to them at the PAT appointment. Patient was also instructed that they will need to review over the PAT instructions again at home before surgery.

## 2021-04-22 ENCOUNTER — Other Ambulatory Visit: Payer: Self-pay

## 2021-04-22 ENCOUNTER — Encounter (HOSPITAL_COMMUNITY)
Admission: RE | Admit: 2021-04-22 | Discharge: 2021-04-22 | Disposition: A | Discharge status: Home / Self Care | Payer: Medicare Other | Source: Ambulatory Visit | Attending: Orthopedic Surgery | Admitting: Orthopedic Surgery

## 2021-04-22 DIAGNOSIS — Z880 Allergy status to penicillin: Secondary | ICD-10-CM | POA: Diagnosis not present

## 2021-04-22 DIAGNOSIS — M1711 Unilateral primary osteoarthritis, right knee: Secondary | ICD-10-CM | POA: Diagnosis not present

## 2021-04-22 DIAGNOSIS — Z20822 Contact with and (suspected) exposure to covid-19: Secondary | ICD-10-CM | POA: Insufficient documentation

## 2021-04-22 DIAGNOSIS — Y792 Prosthetic and other implants, materials and accessory orthopedic devices associated with adverse incidents: Secondary | ICD-10-CM | POA: Diagnosis present

## 2021-04-22 DIAGNOSIS — I10 Essential (primary) hypertension: Secondary | ICD-10-CM | POA: Diagnosis not present

## 2021-04-22 DIAGNOSIS — Y838 Other surgical procedures as the cause of abnormal reaction of the patient, or of later complication, without mention of misadventure at the time of the procedure: Secondary | ICD-10-CM | POA: Diagnosis present

## 2021-04-22 DIAGNOSIS — G4733 Obstructive sleep apnea (adult) (pediatric): Secondary | ICD-10-CM | POA: Diagnosis not present

## 2021-04-22 DIAGNOSIS — Z01812 Encounter for preprocedural laboratory examination: Secondary | ICD-10-CM | POA: Insufficient documentation

## 2021-04-22 DIAGNOSIS — M25561 Pain in right knee: Secondary | ICD-10-CM | POA: Diagnosis present

## 2021-04-22 DIAGNOSIS — F32A Depression, unspecified: Secondary | ICD-10-CM | POA: Diagnosis not present

## 2021-04-22 DIAGNOSIS — T84092A Other mechanical complication of internal right knee prosthesis, initial encounter: Secondary | ICD-10-CM | POA: Diagnosis not present

## 2021-04-22 DIAGNOSIS — G8918 Other acute postprocedural pain: Secondary | ICD-10-CM | POA: Diagnosis not present

## 2021-04-22 DIAGNOSIS — Z96651 Presence of right artificial knee joint: Secondary | ICD-10-CM | POA: Diagnosis not present

## 2021-04-22 DIAGNOSIS — Z9989 Dependence on other enabling machines and devices: Secondary | ICD-10-CM | POA: Diagnosis not present

## 2021-04-22 DIAGNOSIS — T84012A Broken internal right knee prosthesis, initial encounter: Secondary | ICD-10-CM | POA: Diagnosis not present

## 2021-04-22 DIAGNOSIS — Z01818 Encounter for other preprocedural examination: Secondary | ICD-10-CM

## 2021-04-22 DIAGNOSIS — T8484XA Pain due to internal orthopedic prosthetic devices, implants and grafts, initial encounter: Secondary | ICD-10-CM | POA: Diagnosis not present

## 2021-04-22 LAB — SARS CORONAVIRUS 2 (TAT 6-24 HRS): SARS Coronavirus 2: NEGATIVE

## 2021-04-23 NOTE — Anesthesia Preprocedure Evaluation (Addendum)
Anesthesia Evaluation  Patient identified by MRN, date of birth, ID band Patient awake    Reviewed: Allergy & Precautions, NPO status , Patient's Chart, lab work & pertinent test results  Airway Mallampati: II  TM Distance: >3 FB Neck ROM: Full    Dental no notable dental hx. (+) Teeth Intact, Dental Advisory Given   Pulmonary sleep apnea (no CPAp) and Continuous Positive Airway Pressure Ventilation ,    Pulmonary exam normal breath sounds clear to auscultation       Cardiovascular hypertension, Normal cardiovascular exam Rhythm:Regular Rate:Normal     Neuro/Psych PSYCHIATRIC DISORDERS Depression    GI/Hepatic negative GI ROS, Neg liver ROS,   Endo/Other  negative endocrine ROS  Renal/GU      Musculoskeletal  (+) Arthritis , Osteoarthritis,    Abdominal (+) + obese (BMI 44.01),   Peds  Hematology Lab Results      Component                Value               Date                        HGB                      15.3                04/19/2021                HCT                      47.2                04/19/2021                    PLT                      219                 04/19/2021              Anesthesia Other Findings ALL: PCN  Reproductive/Obstetrics                            Anesthesia Physical Anesthesia Plan  ASA: 3  Anesthesia Plan: Spinal   Post-op Pain Management: Minimal or no pain anticipated   Induction:   PONV Risk Score and Plan: 3 and Treatment may vary due to age or medical condition, Ondansetron, Midazolam and Dexamethasone  Airway Management Planned: Natural Airway and Simple Face Mask  Additional Equipment: None  Intra-op Plan:   Post-operative Plan:   Informed Consent: I have reviewed the patients History and Physical, chart, labs and discussed the procedure including the risks, benefits and alternatives for the proposed anesthesia with the patient or  authorized representative who has indicated his/her understanding and acceptance.     Dental advisory given  Plan Discussed with: CRNA and Anesthesiologist  Anesthesia Plan Comments: (R TKR w R adductor canal block)       Anesthesia Quick Evaluation

## 2021-04-25 ENCOUNTER — Inpatient Hospital Stay (HOSPITAL_COMMUNITY): Payer: Medicare Other | Admitting: Anesthesiology

## 2021-04-25 ENCOUNTER — Other Ambulatory Visit: Payer: Self-pay

## 2021-04-25 ENCOUNTER — Inpatient Hospital Stay (HOSPITAL_COMMUNITY)
Admission: RE | Admit: 2021-04-25 | Discharge: 2021-04-26 | DRG: 468 | Disposition: A | Discharge status: Home Health | Payer: Medicare Other | Attending: Orthopedic Surgery | Admitting: Orthopedic Surgery

## 2021-04-25 ENCOUNTER — Encounter (HOSPITAL_COMMUNITY): Payer: Self-pay | Admitting: Orthopedic Surgery

## 2021-04-25 ENCOUNTER — Encounter (HOSPITAL_COMMUNITY)
Admission: RE | Disposition: A | Discharge status: Home / Self Care | Payer: Self-pay | Source: Home / Self Care | Attending: Orthopedic Surgery

## 2021-04-25 DIAGNOSIS — Z20822 Contact with and (suspected) exposure to covid-19: Secondary | ICD-10-CM | POA: Diagnosis present

## 2021-04-25 DIAGNOSIS — Z880 Allergy status to penicillin: Secondary | ICD-10-CM

## 2021-04-25 DIAGNOSIS — T84032A Mechanical loosening of internal right knee prosthetic joint, initial encounter: Secondary | ICD-10-CM

## 2021-04-25 DIAGNOSIS — T84012A Broken internal right knee prosthesis, initial encounter: Principal | ICD-10-CM

## 2021-04-25 DIAGNOSIS — M25561 Pain in right knee: Secondary | ICD-10-CM | POA: Diagnosis present

## 2021-04-25 DIAGNOSIS — Y838 Other surgical procedures as the cause of abnormal reaction of the patient, or of later complication, without mention of misadventure at the time of the procedure: Secondary | ICD-10-CM | POA: Diagnosis present

## 2021-04-25 DIAGNOSIS — F32A Depression, unspecified: Secondary | ICD-10-CM | POA: Diagnosis present

## 2021-04-25 DIAGNOSIS — Y792 Prosthetic and other implants, materials and accessory orthopedic devices associated with adverse incidents: Secondary | ICD-10-CM | POA: Diagnosis present

## 2021-04-25 DIAGNOSIS — M1711 Unilateral primary osteoarthritis, right knee: Secondary | ICD-10-CM | POA: Diagnosis present

## 2021-04-25 DIAGNOSIS — I10 Essential (primary) hypertension: Secondary | ICD-10-CM | POA: Diagnosis present

## 2021-04-25 HISTORY — PX: TOTAL KNEE REVISION: SHX996

## 2021-04-25 LAB — TYPE AND SCREEN
ABO/RH(D): O NEG
Antibody Screen: NEGATIVE

## 2021-04-25 SURGERY — TOTAL KNEE REVISION
Anesthesia: Spinal | Site: Knee | Laterality: Right

## 2021-04-25 MED ORDER — MIDAZOLAM HCL 2 MG/2ML IJ SOLN: 1.0000 mg | INTRAMUSCULAR | Status: DC

## 2021-04-25 MED ORDER — LACTATED RINGERS IV SOLN: INTRAVENOUS | Status: DC

## 2021-04-25 MED ORDER — MIDAZOLAM HCL 2 MG/2ML IJ SOLN
INTRAMUSCULAR | Status: AC
  Administered 2021-04-25: 2 mg via INTRAVENOUS
  Filled 2021-04-25: qty 2

## 2021-04-25 MED ORDER — STERILE WATER FOR IRRIGATION IR SOLN
Status: DC | PRN
  Administered 2021-04-25: 2000 mL

## 2021-04-25 MED ORDER — MIDAZOLAM HCL 2 MG/2ML IJ SOLN
INTRAMUSCULAR | Status: DC | PRN
  Administered 2021-04-25: 2 mg via INTRAVENOUS

## 2021-04-25 MED ORDER — OXYCODONE HCL 5 MG PO TABS
5.0000 mg | ORAL_TABLET | ORAL | Status: DC | PRN
  Administered 2021-04-25: 5 mg via ORAL
  Administered 2021-04-26 (×4): 10 mg via ORAL
  Filled 2021-04-25 (×3): qty 2
  Filled 2021-04-25: qty 1
  Filled 2021-04-25: qty 2

## 2021-04-25 MED ORDER — ONDANSETRON HCL 4 MG/2ML IJ SOLN: 4.0000 mg | Freq: Once | INTRAMUSCULAR | Status: DC | PRN

## 2021-04-25 MED ORDER — ASPIRIN EC 325 MG PO TBEC
325.0000 mg | DELAYED_RELEASE_TABLET | Freq: Two times a day (BID) | ORAL | Status: DC
  Administered 2021-04-26: 325 mg via ORAL
  Filled 2021-04-25: qty 1

## 2021-04-25 MED ORDER — AMISULPRIDE (ANTIEMETIC) 5 MG/2ML IV SOLN: 10.0000 mg | Freq: Once | INTRAVENOUS | Status: DC | PRN

## 2021-04-25 MED ORDER — METHOCARBAMOL 500 MG PO TABS
500.0000 mg | ORAL_TABLET | Freq: Four times a day (QID) | ORAL | Status: DC | PRN
  Administered 2021-04-25 – 2021-04-26 (×3): 500 mg via ORAL
  Filled 2021-04-25 (×3): qty 1

## 2021-04-25 MED ORDER — OXYCODONE HCL 5 MG PO TABS: 5.0000 mg | ORAL_TABLET | Freq: Once | ORAL | Status: DC | PRN

## 2021-04-25 MED ORDER — BUPIVACAINE LIPOSOME 1.3 % IJ SUSP
INTRAMUSCULAR | Status: AC
  Filled 2021-04-25: qty 20

## 2021-04-25 MED ORDER — FENTANYL CITRATE PF 50 MCG/ML IJ SOSY: 50.0000 ug | PREFILLED_SYRINGE | INTRAMUSCULAR | Status: DC

## 2021-04-25 MED ORDER — 0.9 % SODIUM CHLORIDE (POUR BTL) OPTIME
TOPICAL | Status: DC | PRN
Start: 2021-04-25 — End: 2021-04-25
  Administered 2021-04-25: 1000 mL

## 2021-04-25 MED ORDER — TRANEXAMIC ACID-NACL 1000-0.7 MG/100ML-% IV SOLN
1000.0000 mg | INTRAVENOUS | Status: AC
  Administered 2021-04-25: 1000 mg via INTRAVENOUS
  Filled 2021-04-25: qty 100

## 2021-04-25 MED ORDER — DOCUSATE SODIUM 100 MG PO CAPS: 100.0000 mg | ORAL_CAPSULE | Freq: Two times a day (BID) | ORAL | 0 refills | Status: DC

## 2021-04-25 MED ORDER — LOSARTAN POTASSIUM 50 MG PO TABS
100.0000 mg | ORAL_TABLET | Freq: Every day | ORAL | Status: DC
  Administered 2021-04-25 – 2021-04-26 (×2): 100 mg via ORAL
  Filled 2021-04-25 (×2): qty 2

## 2021-04-25 MED ORDER — ACETAMINOPHEN 325 MG PO TABS: 325.0000 mg | ORAL_TABLET | Freq: Four times a day (QID) | ORAL | Status: DC | PRN

## 2021-04-25 MED ORDER — BUPROPION HCL ER (XL) 300 MG PO TB24
300.0000 mg | ORAL_TABLET | Freq: Every day | ORAL | Status: DC
  Administered 2021-04-26: 300 mg via ORAL
  Filled 2021-04-25: qty 1

## 2021-04-25 MED ORDER — DOCUSATE SODIUM 100 MG PO CAPS
100.0000 mg | ORAL_CAPSULE | Freq: Two times a day (BID) | ORAL | Status: DC
  Administered 2021-04-25 – 2021-04-26 (×2): 100 mg via ORAL
  Filled 2021-04-25 (×2): qty 1

## 2021-04-25 MED ORDER — POTASSIUM CHLORIDE CRYS ER 10 MEQ PO TBCR
10.0000 meq | EXTENDED_RELEASE_TABLET | Freq: Every day | ORAL | Status: DC
  Administered 2021-04-26: 10 meq via ORAL
  Filled 2021-04-25: qty 1

## 2021-04-25 MED ORDER — HYDROMORPHONE HCL 1 MG/ML IJ SOLN: 0.2500 mg | INTRAMUSCULAR | Status: DC | PRN

## 2021-04-25 MED ORDER — BUPIVACAINE LIPOSOME 1.3 % IJ SUSP: 20.0000 mL | Freq: Once | INTRAMUSCULAR | Status: AC

## 2021-04-25 MED ORDER — SODIUM CHLORIDE FLUSH 0.9 % IV SOLN
INTRAVENOUS | Status: DC | PRN
  Administered 2021-04-25: 100 mL

## 2021-04-25 MED ORDER — POLYETHYLENE GLYCOL 3350 17 G PO PACK: 17.0000 g | PACK | Freq: Every day | ORAL | Status: DC | PRN

## 2021-04-25 MED ORDER — PROPOFOL 10 MG/ML IV BOLUS
INTRAVENOUS | Status: AC
  Filled 2021-04-25: qty 20

## 2021-04-25 MED ORDER — ONDANSETRON HCL 4 MG/2ML IJ SOLN
INTRAMUSCULAR | Status: DC | PRN
  Administered 2021-04-25: 4 mg via INTRAVENOUS

## 2021-04-25 MED ORDER — CLONIDINE HCL (ANALGESIA) 100 MCG/ML EP SOLN
EPIDURAL | Status: DC | PRN
  Administered 2021-04-25: 100 ug

## 2021-04-25 MED ORDER — BUPIVACAINE-EPINEPHRINE (PF) 0.25% -1:200000 IJ SOLN
INTRAMUSCULAR | Status: AC
  Filled 2021-04-25: qty 30

## 2021-04-25 MED ORDER — TRANEXAMIC ACID-NACL 1000-0.7 MG/100ML-% IV SOLN
1000.0000 mg | Freq: Once | INTRAVENOUS | Status: AC
  Administered 2021-04-25: 1000 mg via INTRAVENOUS
  Filled 2021-04-25: qty 100

## 2021-04-25 MED ORDER — METHOCARBAMOL 500 MG IVPB - SIMPLE MED
500.0000 mg | Freq: Four times a day (QID) | INTRAVENOUS | Status: DC | PRN
  Filled 2021-04-25: qty 50

## 2021-04-25 MED ORDER — ASPIRIN EC 325 MG PO TBEC: 325.0000 mg | DELAYED_RELEASE_TABLET | Freq: Two times a day (BID) | ORAL | 0 refills | Status: DC

## 2021-04-25 MED ORDER — VANCOMYCIN HCL 1500 MG/300ML IV SOLN
1500.0000 mg | INTRAVENOUS | Status: AC
  Administered 2021-04-25: 1500 mg via INTRAVENOUS
  Filled 2021-04-25: qty 300

## 2021-04-25 MED ORDER — CHLORTHALIDONE 25 MG PO TABS
12.5000 mg | ORAL_TABLET | Freq: Every day | ORAL | Status: DC
  Administered 2021-04-26: 12.5 mg via ORAL
  Filled 2021-04-25: qty 1

## 2021-04-25 MED ORDER — CHLORHEXIDINE GLUCONATE 0.12 % MT SOLN
15.0000 mL | Freq: Once | OROMUCOSAL | Status: AC
  Administered 2021-04-25: 15 mL via OROMUCOSAL

## 2021-04-25 MED ORDER — MENTHOL 3 MG MT LOZG: 1.0000 | LOZENGE | OROMUCOSAL | Status: DC | PRN

## 2021-04-25 MED ORDER — ONDANSETRON HCL 4 MG/2ML IJ SOLN: 4.0000 mg | Freq: Four times a day (QID) | INTRAMUSCULAR | Status: DC | PRN

## 2021-04-25 MED ORDER — PROPOFOL 10 MG/ML IV BOLUS
INTRAVENOUS | Status: DC | PRN
Start: 2021-04-25 — End: 2021-04-25
  Administered 2021-04-25: 40 mg via INTRAVENOUS

## 2021-04-25 MED ORDER — CELECOXIB 200 MG PO CAPS: 200.0000 mg | ORAL_CAPSULE | Freq: Two times a day (BID) | ORAL | 0 refills | Status: AC

## 2021-04-25 MED ORDER — SODIUM CHLORIDE 0.9% FLUSH
INTRAVENOUS | Status: DC | PRN
  Administered 2021-04-25: 50 mL

## 2021-04-25 MED ORDER — DEXAMETHASONE SODIUM PHOSPHATE 10 MG/ML IJ SOLN
INTRAMUSCULAR | Status: DC | PRN
Start: 2021-04-25 — End: 2021-04-25
  Administered 2021-04-25: 4 mg via INTRAVENOUS

## 2021-04-25 MED ORDER — FENTANYL CITRATE PF 50 MCG/ML IJ SOSY
PREFILLED_SYRINGE | INTRAMUSCULAR | Status: AC
  Administered 2021-04-25: 50 ug via INTRAVENOUS
  Filled 2021-04-25: qty 2

## 2021-04-25 MED ORDER — PROPOFOL 500 MG/50ML IV EMUL
INTRAVENOUS | Status: DC | PRN
  Administered 2021-04-25: 80 ug/kg/min via INTRAVENOUS

## 2021-04-25 MED ORDER — HYDROMORPHONE HCL 1 MG/ML IJ SOLN: 0.5000 mg | INTRAMUSCULAR | Status: DC | PRN

## 2021-04-25 MED ORDER — VANCOMYCIN HCL IN DEXTROSE 1-5 GM/200ML-% IV SOLN
1000.0000 mg | Freq: Two times a day (BID) | INTRAVENOUS | Status: AC
  Administered 2021-04-26: 1000 mg via INTRAVENOUS
  Filled 2021-04-25: qty 200

## 2021-04-25 MED ORDER — ROPIVACAINE HCL 5 MG/ML IJ SOLN
INTRAMUSCULAR | Status: DC | PRN
  Administered 2021-04-25: 30 mL via PERINEURAL

## 2021-04-25 MED ORDER — DEXAMETHASONE SODIUM PHOSPHATE 10 MG/ML IJ SOLN
10.0000 mg | Freq: Two times a day (BID) | INTRAMUSCULAR | Status: DC
  Administered 2021-04-25 – 2021-04-26 (×2): 10 mg via INTRAVENOUS
  Filled 2021-04-25 (×2): qty 1

## 2021-04-25 MED ORDER — TIZANIDINE HCL 2 MG PO TABS: 2.0000 mg | ORAL_TABLET | Freq: Three times a day (TID) | ORAL | 0 refills | Status: DC | PRN

## 2021-04-25 MED ORDER — ZOLPIDEM TARTRATE 5 MG PO TABS: 5.0000 mg | ORAL_TABLET | Freq: Every evening | ORAL | Status: DC | PRN

## 2021-04-25 MED ORDER — OXYCODONE-ACETAMINOPHEN 5-325 MG PO TABS: 1.0000 | ORAL_TABLET | Freq: Four times a day (QID) | ORAL | 0 refills | Status: DC | PRN

## 2021-04-25 MED ORDER — SODIUM CHLORIDE 0.9 % IR SOLN
Status: DC | PRN
  Administered 2021-04-25: 1000 mL

## 2021-04-25 MED ORDER — OXYCODONE HCL 5 MG/5ML PO SOLN: 5.0000 mg | Freq: Once | ORAL | Status: DC | PRN

## 2021-04-25 MED ORDER — ONDANSETRON HCL 4 MG PO TABS
4.0000 mg | ORAL_TABLET | Freq: Four times a day (QID) | ORAL | Status: DC | PRN
  Filled 2021-04-25: qty 1

## 2021-04-25 MED ORDER — MIDAZOLAM HCL 2 MG/2ML IJ SOLN
INTRAMUSCULAR | Status: AC
  Filled 2021-04-25: qty 2

## 2021-04-25 MED ORDER — ORAL CARE MOUTH RINSE: 15.0000 mL | Freq: Once | OROMUCOSAL | Status: AC

## 2021-04-25 MED ORDER — SODIUM CHLORIDE 0.9 % IV SOLN: INTRAVENOUS | Status: DC

## 2021-04-25 MED ORDER — PHENOL 1.4 % MT LIQD: 1.0000 | OROMUCOSAL | Status: DC | PRN

## 2021-04-25 MED ORDER — DIPHENHYDRAMINE HCL 12.5 MG/5ML PO ELIX: 12.5000 mg | ORAL_SOLUTION | ORAL | Status: DC | PRN

## 2021-04-25 MED ORDER — ACETAMINOPHEN 10 MG/ML IV SOLN: 1000.0000 mg | Freq: Once | INTRAVENOUS | Status: DC | PRN

## 2021-04-25 MED ORDER — BUPIVACAINE HCL (PF) 0.5 % IJ SOLN
INTRAMUSCULAR | Status: DC | PRN
  Administered 2021-04-25: 3 mL via INTRATHECAL

## 2021-04-25 MED ORDER — BISACODYL 5 MG PO TBEC: 5.0000 mg | DELAYED_RELEASE_TABLET | Freq: Every day | ORAL | Status: DC | PRN

## 2021-04-25 SURGICAL SUPPLY — 58 items
BAG COUNTER SPONGE SURGICOUNT (BAG) IMPLANT
BAG ZIPLOCK 12X15 (MISCELLANEOUS) ×2 IMPLANT
BENZOIN TINCTURE PRP APPL 2/3 (GAUZE/BANDAGES/DRESSINGS) ×1 IMPLANT
BIT DRILL 2.8X128 (BIT) ×1 IMPLANT
BLADE SAGITTAL 25.0X1.19X90 (BLADE) ×2 IMPLANT
BLADE SAW SGTL 13.0X1.19X90.0M (BLADE) ×2 IMPLANT
BLADE SAW SGTL 81X20 HD (BLADE) IMPLANT
BLADE SURG SZ10 CARB STEEL (BLADE) ×4 IMPLANT
BNDG ELASTIC 4X5.8 VLCR STR LF (GAUZE/BANDAGES/DRESSINGS) ×2 IMPLANT
BNDG ELASTIC 6X5.8 VLCR STR LF (GAUZE/BANDAGES/DRESSINGS) ×2 IMPLANT
BOOTIES KNEE HIGH SLOAN (MISCELLANEOUS) ×2 IMPLANT
BOWL SMART MIX CTS (DISPOSABLE) ×2 IMPLANT
CEMENT HV SMART SET (Cement) ×1 IMPLANT
DECANTER SPIKE VIAL GLASS SM (MISCELLANEOUS) ×2 IMPLANT
DRAPE INCISE IOBAN 66X45 STRL (DRAPES) ×2 IMPLANT
DRAPE U-SHAPE 47X51 STRL (DRAPES) ×2 IMPLANT
DRSG AQUACEL AG ADV 3.5X10 (GAUZE/BANDAGES/DRESSINGS) ×2 IMPLANT
DRSG PAD ABDOMINAL 8X10 ST (GAUZE/BANDAGES/DRESSINGS) ×2 IMPLANT
ELECT REM PT RETURN 15FT ADLT (MISCELLANEOUS) ×2 IMPLANT
GAUZE SPONGE 4X4 12PLY STRL (GAUZE/BANDAGES/DRESSINGS) ×2 IMPLANT
GLOVE SRG 8 PF TXTR STRL LF DI (GLOVE) ×2 IMPLANT
GLOVE SURG NEOP MICRO LF SZ7.5 (GLOVE) ×4 IMPLANT
GLOVE SURG UNDER POLY LF SZ8 (GLOVE) ×4
GOWN STRL REUS W/TWL XL LVL3 (GOWN DISPOSABLE) ×4 IMPLANT
HANDPIECE INTERPULSE COAX TIP (DISPOSABLE) ×2
HOLDER FOLEY CATH W/STRAP (MISCELLANEOUS) IMPLANT
HOOD PEEL AWAY FLYTE STAYCOOL (MISCELLANEOUS) ×6 IMPLANT
IMMOBILIZER KNEE 20 (SOFTGOODS)
IMMOBILIZER KNEE 20 THIGH 36 (SOFTGOODS) IMPLANT
KIT TURNOVER KIT A (KITS) IMPLANT
MANIFOLD NEPTUNE II (INSTRUMENTS) ×2 IMPLANT
NDL SAFETY ECLIPSE 18X1.5 (NEEDLE) IMPLANT
NEEDLE HYPO 18GX1.5 SHARP (NEEDLE)
NS IRRIG 1000ML POUR BTL (IV SOLUTION) ×2 IMPLANT
PACK TOTAL KNEE CUSTOM (KITS) ×2 IMPLANT
PADDING CAST COTTON 6X4 STRL (CAST SUPPLIES) ×3 IMPLANT
PATELLA DOME PFC 41MM (Knees) ×1 IMPLANT
PLATE ROT INSERT 15MM SIZE 5 (Plate) ×1 IMPLANT
PROTECTOR NERVE ULNAR (MISCELLANEOUS) ×2 IMPLANT
SET HNDPC FAN SPRY TIP SCT (DISPOSABLE) ×1 IMPLANT
SPONGE T-LAP 18X18 ~~LOC~~+RFID (SPONGE) ×6 IMPLANT
SPONGE T-LAP 4X18 ~~LOC~~+RFID (SPONGE) IMPLANT
STAPLER VISISTAT 35W (STAPLE) IMPLANT
STRIP CLOSURE SKIN 1/2X4 (GAUZE/BANDAGES/DRESSINGS) ×2 IMPLANT
SUT VIC AB 0 CT1 27 (SUTURE) ×4
SUT VIC AB 0 CT1 27XBRD ANTBC (SUTURE) ×2 IMPLANT
SUT VIC AB 1 CT1 27 (SUTURE) ×4
SUT VIC AB 1 CT1 27XBRD ANTBC (SUTURE) ×2 IMPLANT
SUT VIC AB 2-0 CT1 27 (SUTURE) ×2
SUT VIC AB 2-0 CT1 TAPERPNT 27 (SUTURE) ×1 IMPLANT
SWAB COLLECTION DEVICE MRSA (MISCELLANEOUS) ×2 IMPLANT
SWAB CULTURE ESWAB REG 1ML (MISCELLANEOUS) ×2 IMPLANT
SYR 3ML LL SCALE MARK (SYRINGE) IMPLANT
TOWER CARTRIDGE SMART MIX (DISPOSABLE) IMPLANT
TRAY FOLEY MTR SLVR 16FR STAT (SET/KITS/TRAYS/PACK) ×2 IMPLANT
WATER STERILE IRR 1000ML POUR (IV SOLUTION) ×2 IMPLANT
WRAP KNEE MAXI GEL POST OP (GAUZE/BANDAGES/DRESSINGS) ×1 IMPLANT
YANKAUER SUCT BULB TIP NO VENT (SUCTIONS) ×2 IMPLANT

## 2021-04-25 NOTE — Anesthesia Procedure Notes (Signed)
Spinal  Patient location during procedure: OR Start time: 04/25/2021 3:19 PM Reason for block: surgical anesthesia Staffing Performed: resident/CRNA  Anesthesiologist: Barnet Glasgow, MD Resident/CRNA: Eben Burow, CRNA Preanesthetic Checklist Completed: patient identified, IV checked, site marked, risks and benefits discussed, surgical consent, monitors and equipment checked, pre-op evaluation and timeout performed Spinal Block Patient position: sitting Prep: DuraPrep and site prepped and draped Patient monitoring: heart rate, cardiac monitor, continuous pulse ox and blood pressure Approach: midline Location: L3-4 Injection technique: single-shot Needle Needle type: Pencan  Needle gauge: 24 G Needle length: 10 cm Assessment Events: CSF return Additional Notes Pt placed in sitting position, spinal kit expiration date checked and verified, + CSF, - heme, pt tolerated well. Adequate sensory level. Dr Valma Cava present and supervising throughout SAB placement.

## 2021-04-25 NOTE — Discharge Instructions (Signed)

## 2021-04-25 NOTE — Transfer of Care (Signed)
Immediate Anesthesia Transfer of Care Note  Patient: [name]  Procedure(s) Performed: KNEE REVISION two componet quadriceps tendon repair (Right: Knee)  Patient Location: PACU  Anesthesia Type:Spinal  Level of Consciousness: awake, alert  and patient cooperative  Airway & Oxygen Therapy: Patient Spontanous Breathing and Patient connected to face mask oxygen  Post-op Assessment: Report given to RN and Post -op Vital signs reviewed and stable  Post vital signs: Reviewed and stable  Last Vitals:  Vitals Value Taken Time  BP 135/80 04/25/21 1738  Temp    Pulse 80 04/25/21 1741  Resp 16 04/25/21 1741  SpO2 97 % 04/25/21 1741  Vitals shown include unvalidated device data.  Last Pain:  Vitals:   04/25/21 1048  TempSrc: Oral         Complications: No notable events documented.

## 2021-04-25 NOTE — Progress Notes (Signed)
AssistedDr. Houser with right, ultrasound guided, adductor canal block. Side rails up, monitors on throughout procedure. See vital signs in flow sheet. Tolerated Procedure well.  

## 2021-04-25 NOTE — H&P (Signed)
TOTAL KNEE REVISION ADMISSION H&P  Patient is being admitted for right revision total knee arthroplasty.  Subjective:  Chief Complaint:right knee pain.  HPI: [name], 71 y.o. male, has a history of pain and functional disability in the right knee(s) due to failed previous arthroplasty and patient has failed non-surgical conservative treatments for greater than 12 weeks to include NSAID's and/or analgesics and activity modification. The indications for the revision of the total knee arthroplasty are  unrelenting pain for greater than a year . Onset of symptoms was gradual starting 1 years ago with rapidlly worsening course since that time.  Prior procedures on the right knee(s) include arthroplasty.  Patient currently rates pain in the right knee(s) at 9 out of 10 with activity. There is night pain, worsening of pain with activity and weight bearing, and joint swelling.  Patient has evidence of subchondral cysts, subchondral sclerosis, and no significant radiographic abnormalities  by imaging studies. This condition presents safety issues increasing the risk of falls. This patient has had  failure of total knee replacement to relieve longstanding pain .  There is no current active infection.  There are no problems to display for this patient.  Past Medical History:  Diagnosis Date   Depression    Hypertension     Past Surgical History:  Procedure Laterality Date   CARPAL TUNNEL RELEASE Bilateral 03/28/2003   COLON SURGERY  03/27/2001   diverticulitis   COLON SURGERY     JOINT REPLACEMENT Bilateral 03/27/2010   knees   SHOULDER ARTHROSCOPY Left 06/25/2015   Procedure: ARTHROSCOPY SHOULDER;  Surgeon: Jodi Geralds, MD;  Location: [facility];  Service: Orthopedics;  Laterality: Left;  Left shoulder arthroscopy with subacromial decompression and distal clavicle resection.    SHOULDER OPEN ROTATOR CUFF REPAIR Left 06/25/2015   Procedure: ROTATOR CUFF REPAIR SHOULDER OPEN;   Surgeon: Jodi Geralds, MD;  Location: [facility];  Service: Orthopedics;  Laterality: Left;   TRIGGER FINGER RELEASE Right     Current Facility-Administered Medications  Medication Dose Route Frequency Provider Last Rate Last Admin   bupivacaine liposome (EXPAREL) 1.3 % injection 266 mg  20 mL Other Once Jodi Geralds, MD       lactated ringers infusion   Intravenous Continuous Trevor Iha, MD       tranexamic acid (CYKLOKAPRON) IVPB 1,000 mg  1,000 mg Intravenous To OR Izela Altier, MD       vancomycin (VANCOREADY) IVPB 1500 mg/300 mL  1,500 mg Intravenous On Call to OR Jodi Geralds, MD       Allergies  Allergen Reactions   Penicillins Hives    Social History   Tobacco Use   Smoking status: Never   Smokeless tobacco: Not on file  Substance Use Topics   Alcohol use: Yes    Comment: occas.    History reviewed. No pertinent family history.    Review of Systems  ROS: I have reviewed the patient's review of systems thoroughly and there are no positive responses as relates to the HPI.  Objective:  Physical Exam  Vital signs in last 24 hours: Temp:  [98.3 F (36.8 C)] 98.3 F (36.8 C) (01/30 1048) Pulse Rate:  [85] 85 (01/30 1048) BP: (199)/(93) 199/93 (01/30 1048) SpO2:  [96 %] 96 % (01/30 1048) Well-developed well-nourished patient in no acute distress. Alert and oriented x3 HEENT:within normal limits Cardiac: Regular rate and rhythm Pulmonary: Lungs clear to auscultation Abdomen: Soft and nontender.  Normal active bowel sounds  Musculoskeletal right knee: Painful range of motion.  Limited range of motion.  Trace effusion.  No instability.  No erythema or warmth. Labs: Recent Results (from the past 2160 hour(s))  CBC per protocol     Status: None   Collection Time: 04/19/21 11:26 AM  Result Value Ref Range   WBC 6.6 4.0 - 10.5 K/uL   RBC 4.96 4.22 - 5.81 MIL/uL   Hemoglobin 15.3 13.0 - 17.0 g/dL   HCT 24.447.2 01.039.0 - 27.252.0 %   MCV 95.2 80.0 - 100.0 fL    MCH 30.8 26.0 - 34.0 pg   MCHC 32.4 30.0 - 36.0 g/dL   RDW 53.614.7 64.411.5 - 03.415.5 %   Platelets 219 150 - 400 K/uL   nRBC 0.0 0.0 - 0.2 %    Comment: Performed at [facility], [address] [place], [place] [address]  Basic metabolic panel per protocol     Status: Abnormal   Collection Time: 04/19/21 11:26 AM  Result Value Ref Range   Sodium 141 135 - 145 mmol/L   Potassium 3.8 3.5 - 5.1 mmol/L   Chloride 103 98 - 111 mmol/L   CO2 31 22 - 32 mmol/L   Glucose, Bld 103 (H) 70 - 99 mg/dL    Comment: Glucose reference range applies only to samples taken after fasting for at least 8 hours.   BUN 26 (H) 8 - 23 mg/dL   Creatinine, Ser 5.630.93 0.61 - 1.24 mg/dL   Calcium 9.6 8.9 - 87.510.3 mg/dL   GFR, Estimated >64>60 >33>60 mL/min    Comment: (NOTE) Calculated using the CKD-EPI Creatinine Equation (2021)    Anion gap 7 5 - 15    Comment: Performed at [facility], [address] [place], [place] [address]  Surgical PCR Screen     Status: None   Collection Time: 04/19/21 11:26 AM   Specimen: Nasal Mucosa; Nasal Swab  Result Value Ref Range   MRSA, PCR NEGATIVE NEGATIVE   Staphylococcus aureus NEGATIVE NEGATIVE    Comment: (NOTE) The Xpert SA Assay (FDA approved for NASAL specimens in patients 69 years of age and older), is one component of a comprehensive surveillance program. It is not intended to diagnose infection nor to guide or monitor treatment. Performed at [facility], [address] [place], [place] [address]   Type and screen Order type and screen if day of surgery is less than 15 days from draw of preadmission visit or order morning of surgery if day of surgery is greater than 6 days from preadmission visit.     Status: None   Collection Time: 04/19/21 11:26 AM  Result Value Ref Range   ABO/RH(D) O NEG    Antibody Screen NEG    Sample Expiration 04/28/2021,2359    Extend sample reason      NO TRANSFUSIONS OR PREGNANCY IN  THE PAST 3 MONTHS Performed at [facility], [address] [place], [place] [address]   SARS CORONAVIRUS 2 (TAT 6-24 HRS) Nasopharyngeal Nasopharyngeal Swab     Status: None   Collection Time: 04/22/21  7:09 AM   Specimen: Nasopharyngeal Swab  Result Value Ref Range   SARS Coronavirus 2 NEGATIVE NEGATIVE    Comment: (NOTE) SARS-CoV-2 target nucleic acids are NOT DETECTED.  The SARS-CoV-2 RNA is generally detectable in upper and lower respiratory specimens during the acute phase of infection. Negative results do not preclude SARS-CoV-2 infection, do not rule out co-infections with other  pathogens, and should not be used as the sole basis for treatment or other patient management decisions. Negative results must be combined with clinical observations, patient history, and epidemiological information. The expected result is Negative.  Fact Sheet for Patients: HairSlick.no  Fact Sheet for Healthcare Providers: quierodirigir.com  This test is not yet approved or cleared by the [place] FDA and  has been authorized for detection and/or diagnosis of SARS-CoV-2 by FDA under an Emergency Use Authorization (EUA). This EUA will remain  in effect (meaning this test can be used) for the duration of the COVID-19 declaration under Se ction 564(b)(1) of the Act, 21 U.S.C. section 360bbb-3(b)(1), unless the authorization is terminated or revoked sooner.  Performed at [facility], [address], [place], [place] [address]     Estimated body mass index is 44.01 kg/m as calculated from the following:   Height as of 04/19/21: 5\' 7"  (1.702 m).   Weight as of 04/19/21: 127.5 kg.  Imaging Review Plain radiographs demonstrate  no significant radiographic abnormalities other than a area of lucency around the femur  degenerative joint disease of the right knee(s). The overall alignment is neutral.There is evidence  of loosening of the  femoral  components. The bone quality appears to be fair for age and reported activity level. There is no significant abnormalities radiographically.    Assessment/Plan:  End stage arthritis, right knee(s) with failed previous arthroplasty.   The patient history, physical examination, clinical judgment of the provider and imaging studies are consistent with end stage degenerative joint disease of the right knee(s), previous total knee arthroplasty. Revision total knee arthroplasty is deemed medically necessary. The treatment options including medical management, injection therapy, arthroscopy and revision arthroplasty were discussed at length. The risks and benefits of revision total knee arthroplasty were presented and reviewed. The risks due to aseptic loosening, infection, stiffness, patella tracking problems, thromboembolic complications and other imponderables were discussed. The patient acknowledged the explanation, agreed to proceed with the plan and consent was signed. Patient is being admitted for inpatient treatment for surgery, pain control, PT, OT, prophylactic antibiotics, VTE prophylaxis, progressive ambulation and ADL's and discharge planning.The patient is planning to be discharged home with home health services

## 2021-04-25 NOTE — Brief Op Note (Signed)
04/25/2021  5:05 PM  PATIENT:  [name]  71 y.o. male  PRE-OPERATIVE DIAGNOSIS:  PAINFULL RIGHT TOTAL KNEE REPLACEMENT  POST-OPERATIVE DIAGNOSIS:  Painful right total knee replacement  PROCEDURE:  Procedure(s): KNEE REVISION two componet quadriceps tendon repair (Right)  SURGEON:  Surgeon(s) and Role:    Dorna Leitz, MD - Primary  PHYSICIAN ASSISTANT:   ASSISTANTS: Gaspar Skeeters   ANESTHESIA:   spinal  EBL:  minimal   BLOOD ADMINISTERED:none  DRAINS: none   LOCAL MEDICATIONS USED:  MARCAINE    and OTHER experel  SPECIMEN:  Source of Specimen:  fluid r knee  DISPOSITION OF SPECIMEN:  PATHOLOGY  COUNTS:  YES  TOURNIQUET:   Total Tourniquet Time Documented: Thigh (Right) - 67 minutes Total: Thigh (Right) - 67 minutes   DICTATION: .Other Dictation: Dictation Number [id number]  PLAN OF CARE: Admit for overnight observation  PATIENT DISPOSITION:  PACU - hemodynamically stable.   Delay start of Pharmacological VTE agent (>24hrs) due to surgical blood loss or risk of bleeding: no

## 2021-04-25 NOTE — Anesthesia Procedure Notes (Signed)
Anesthesia Regional Block: Adductor canal block   Pre-Anesthetic Checklist: , timeout performed,  Correct Patient, Correct Site, Correct Laterality,  Correct Procedure, Correct Position, site marked,  Risks and benefits discussed,  Surgical consent,  Pre-op evaluation,  At surgeon's request and post-op pain management  Laterality: Lower and Right  Prep: chloraprep       Needles:  Injection technique: Single-shot  Needle Type: Echogenic Needle     Needle Length: 9cm  Needle Gauge: 22     Additional Needles:   Procedures:,,,, ultrasound used (permanent image in chart),,    Narrative:  Start time: 04/25/2021 2:06 PM End time: 04/25/2021 2:11 PM Injection made incrementally with aspirations every 5 mL.  Performed by: Personally  Anesthesiologist: Trevor Iha, MD  Additional Notes: Block assessed prior to surgery. Pt tolerated procedure well.

## 2021-04-25 NOTE — Anesthesia Procedure Notes (Signed)
Procedure Name: MAC Date/Time: 04/25/2021 3:12 PM Performed by: Eben Burow, CRNA Pre-anesthesia Checklist: Patient identified, Emergency Drugs available, Suction available, Patient being monitored and Timeout performed Oxygen Delivery Method: Simple face mask Placement Confirmation: positive ETCO2

## 2021-04-25 NOTE — Progress Notes (Signed)
Orthopedic Tech Progress Note Patient Details:  [name] [birth year] [id number]  CPM Right Knee CPM Right Knee: On Right Knee Flexion (Degrees): 90 Right Knee Extension (Degrees): 0  Post Interventions Patient Tolerated: Well Instructions Provided: Care of device  [name] 04/25/2021, 6:15 PM

## 2021-04-25 NOTE — Plan of Care (Signed)
  Problem: Pain Managment: Goal: General experience of comfort will improve Outcome: Progressing   

## 2021-04-26 ENCOUNTER — Encounter (HOSPITAL_COMMUNITY): Payer: Self-pay | Admitting: Orthopedic Surgery

## 2021-04-26 ENCOUNTER — Other Ambulatory Visit: Payer: Self-pay

## 2021-04-26 LAB — CBC
HCT: 43.5 % (ref 39.0–52.0)
Hemoglobin: 14.1 g/dL (ref 13.0–17.0)
MCH: 31.1 pg (ref 26.0–34.0)
MCHC: 32.4 g/dL (ref 30.0–36.0)
MCV: 95.8 fL (ref 80.0–100.0)
Platelets: 205 10*3/uL (ref 150–400)
RBC: 4.54 MIL/uL (ref 4.22–5.81)
RDW: 15.1 % (ref 11.5–15.5)
WBC: 13.8 10*3/uL — ABNORMAL HIGH (ref 4.0–10.5)
nRBC: 0 % (ref 0.0–0.2)

## 2021-04-26 NOTE — TOC Transition Note (Signed)
Transition of Care [facility]) - CM/SW Discharge Note   Patient Details  Name: [name] MRN: [id number] Date of Birth: 22-Jun-1950  [facility] of [facility] CM/SW Contact:  Lennart Pall, LCSW Phone Number: 04/26/2021, 2:14 PM   Clinical Narrative:    Met with pt and confirmed he has all needed DME at home.  Orders in for HHPT - no agency preference - referral placed with [facility].  No further TOC needs.   Final next level of care: Home w Home Health Services Barriers to Discharge: No Barriers Identified   Patient Goals and CMS Choice Patient states their goals for this hospitalization and ongoing recovery are:: return home      Discharge Placement                       Discharge Plan and Services                          HH Arranged: PT HH Agency: [facility] Date [place]: 04/26/21 Time [place]: 4514 Representative spoke with at [place]: [place] ([place]) Interventions     Readmission Risk Interventions Readmission Risk Prevention Plan 04/26/2021  Post Dischage Appt Complete  Medication Screening Complete  Transportation Screening Complete  Some recent data might be hidden

## 2021-04-26 NOTE — Progress Notes (Signed)
Subjective: 1 Day Post-Op Procedure(s) (LRB): KNEE REVISION two componet quadriceps tendon repair (Right) Patient reports pain as moderate.  Overall the patient is doing well today.  He wants to go home after physical therapy.  He is taking by mouth okay.  He has not voided yet when he was seen at 815 this morning.  His Foley was removed this morning.  Objective: Vital signs in last 24 hours: Temp:  [97.6 F (36.4 C)-98.5 F (36.9 C)] 97.8 F (36.6 C) (01/31 0453) Pulse Rate:  [70-89] 89 (01/31 0453) Resp:  [11-23] 20 (01/31 0453) BP: (133-199)/(63-101) 169/101 (01/31 0453) SpO2:  [94 %-99 %] 95 % (01/31 0453) Weight:  [127.5 kg] 127.5 kg (01/31 0500)  Intake/Output from previous day: 01/30 0701 - 01/31 0700 In: 3670.3 [P.O.:320; I.V.:2537.8; IV Piggyback:812.5] Out: 1000 [Urine:950; Blood:50] Intake/Output this shift: Total I/O In: -  Out: 300 [Urine:300]  Recent Labs    04/26/21 0306  HGB 14.1   Recent Labs    04/26/21 0306  WBC 13.8*  RBC 4.54  HCT 43.5  PLT 205   No results for input(s): NA, K, CL, CO2, BUN, CREATININE, GLUCOSE, CALCIUM in the last 72 hours. No results for input(s): LABPT, INR in the last 72 hours. Right knee exam: Neurovascular intact Sensation intact distally Intact pulses distally Dorsiflexion/Plantar flexion intact Incision: dressing C/D/I No cellulitis present Compartment soft   Assessment/Plan: 1 Day Post-Op Procedure(s) (LRB): KNEE REVISION two componet quadriceps tendon repair (Right) Plan: Weight-bear as tolerated on right. Aspirin 325 mg b.I.d. 1 month postop for DVT prophylaxis. Up with therapy Discharge home today after physical therapy. Follow-up with Dr. Berenice Primas in 2 weeks.  The patient has done very well pain-wise postoperatively so we feel that it is safe for him to be discharged home today.  He lives with his wife.   Erlene Senters 04/26/2021, 10:21 AM

## 2021-04-26 NOTE — Anesthesia Postprocedure Evaluation (Signed)
Anesthesia Post Note  Patient: Hamilton Capri  Procedure(s) Performed: KNEE REVISION two componet quadriceps tendon repair (Right: Knee)     Patient location during evaluation: Nursing Unit Anesthesia Type: Spinal Level of consciousness: oriented and awake and alert Pain management: pain level controlled Vital Signs Assessment: post-procedure vital signs reviewed and stable Respiratory status: spontaneous breathing and respiratory function stable Cardiovascular status: blood pressure returned to baseline and stable Postop Assessment: no headache, no backache, no apparent nausea or vomiting and patient able to bend at knees Anesthetic complications: no   No notable events documented.  Last Vitals:  Vitals:   04/26/21 0453 04/26/21 1408  BP: (!) 169/101 104/64  Pulse: 89 (!) 105  Resp: 20 16  Temp: 36.6 C 36.8 C  SpO2: 95% 91%    Last Pain:  Vitals:   04/26/21 1511  TempSrc:   PainSc: 6                  Trevor Iha

## 2021-04-26 NOTE — Plan of Care (Signed)
  Problem: Education: Goal: Knowledge of General Education information will improve Description Including pain rating scale, medication(s)/side effects and non-pharmacologic comfort measures Outcome: Progressing   Problem: Health Behavior/Discharge Planning: Goal: Ability to manage health-related needs will improve Outcome: Progressing   

## 2021-04-26 NOTE — Op Note (Signed)
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  PREOPERATIVE DIAGNOSIS:  Painful right knee, status post total knee replacement 12 years ago, with infection workup which was essentially negative.  Bone scan which showed hyper uptake in the synovial tissues around the femur and potentially in the area  of the femur consistent with loosening or infection.  POSTOPERATIVE DIAGNOSIS:  Failed patella and failed tibial component, right knee.  PRINCIPAL PROCEDURE: 1.  Total knee 2 components revision right, including patella and tibial component  2.  Quadriceps tendon repair.  SURGEON:  Jodi Geralds, MD  ASSISTANT:  Marshia Ly, PA-C, was present for the entire case and assisted by retraction of tissues, manipulation of the leg and closing to minimize OR time.  BRIEF HISTORY:  The patient is a 71 year old male with a long history of having bilateral simultaneous knee replacement.  He did great for 12 years, then began having sense of instability and pain.  He was drained multiple times in the office, but  continued to complain of pain.  We did a steroid injection, which worked for a while, but he continued to complain of pain, felt some instability when he was going to stand up.  At any rate, because of failure of conservative care, ultimately underwent a  bone scan, which lit up around the femoral component, which was unusual and after continued complaints of pain, we felt that he needed to be taken back with a plan for full revision.  He was brought to the operating room for this procedure.  DESCRIPTION OF PROCEDURE:  The patient was brought to the operating room, after adequate anesthesia was obtained with a spinal anesthetic, the patient was placed supine on the  operating table.  The right leg was then prepped and draped in the usual  sterile fashion.  Following this, the leg was exsanguinated, blood pressure tourniquet inflated to 300 mmHg.  Following this, a midline incision was made, subcutaneous dissection down to the level of the extensor mechanism and a medial parapatellar  arthrotomy was undertaken and taken across the quad tendon by way of a quadriceps snip. Marks were made across the quad tendon for subsequent repair later in the case.  At this point, attention was turned towards the synovial resection and a massive  synovial resection was undertaken around the femur.  Fluid when we came into the knee looked benign and it was sent for Gram stain and culture.  At this point, an extensive synovectomy was performed.  Attention was turned to the polyethylene, where I  took an osteotome and we split the polyethylene and once we did that, we looked at where the split was made and then below that, you could see that the post was obviously broken.  Interestingly, at this point, we removed all this portion of the plastic.   The interesting thing, we first came in with the patella, we did remove the rind around the patella, but then it became very clear that there was about an eighth inch wide, maybe a quarter inch wide strip, that corresponded perfectly to the polyethylene  in terms of size, so it became clear the patient with his broken post was rocking the patella forward. When that happened, it would engage the patella, which was delaminating the patella, causing instability and  continued effusions.  At this point, we  felt that revision of the patella and revision of the polyethylene would be appropriate and so we did an extensive and thorough synovectomy, really went through the front of the knee, side of the knee, back of the knee and once that was all resected, put  a new trial and upsized the poly from 10 to 12.5, still felt like we get a little bit more,  went to 15 and that really felt very snug to me. I put some towel clips around the old patella just to see and that seemed pretty good.  At that point, we took 2  towel clips and went to the patellar component, which was in and just did a cleanup cut underneath the patella and removed the entire patella, still had 15 mm of patella at that point, we took a small drill and drilled out the polyethylene and then put  the guide back over the old holes and re-drilled the depth of the holes, with the trial patella, at this point, got some towel clips out and a 15 seemed to be pretty good in terms of holding the poly in place, not rocking the knee forward at all.  Once  this was done, all these trials were removed.  The knee was copiously and thoroughly lavaged with pulsatile lavage irrigation and suctioned dry.  Cement was mixed and the patella was cemented in place and held with a clamp.  The 15 poly trial was put in  and we waited for the cement to completely harden and injected Exparel in the popliteal space and all around the synovial reflection throughout the knee and then at this point, I let the tourniquet down, all bleeders controlled with electrocautery and  then once the cement was allowed to completely harden, we then did a repair of the quadriceps tendon with Ethibond interrupted sutures and then took a 1 Vicryl and did a running parapatellar stitch as always.  At that point, I could easily flex him up to  125 degrees.  There was no instability. Came easily to full extension and felt like a very nice job had been done at this point.  The subcutaneous was closed with 0 and 2-0 Vicryl and then skin staples.  A sterile compressive dressing was applied and  the patient was taken to recovery, was noted to be in satisfactory condition.  Marshia Ly was present for the entire case and assisted by retraction of tissues, manipulation of the leg, and closing to minimize OR time.   SHW D: 04/25/2021 5:13:55  pm T: 04/26/2021 3:43:00 am  JOB: [id number]/ [id number]

## 2021-04-26 NOTE — Discharge Summary (Signed)
Patient ID: [name] MRN: [id number] DOB/AGE: 69-Jun-1952 69 y.o.  Admit date: 04/25/2021 Discharge date: 04/26/2021  Admission Diagnoses:  Principal Problem:   Failed total knee, right [facility])   Discharge Diagnoses:  Same  Past Medical History:  Diagnosis Date   Depression    Hypertension     Surgeries: Procedure(s):Right KNEE REVISION two componet quadriceps tendon repair on 04/25/2021   Discharged Condition: Improved  Hospital Course: Jacian Shields is an 71 y.o. male who was admitted 04/25/2021 for operative treatment ofFailed total knee, right ([place]). Patient has severe unremitting pain that affects sleep, daily activities, and work/hobbies. After pre-op clearance the patient was taken to the operating room on 04/25/2021 and underwent  Procedure(s):Right KNEE REVISION two componet quadriceps tendon repair.    Patient was given perioperative antibiotics:  Anti-infectives (From admission, onward)    Start     Dose/Rate Route Frequency Ordered Stop   04/26/21 0200  vancomycin (VANCOCIN) IVPB 1000 mg/200 mL premix        1,000 mg 200 mL/hr over 60 Minutes Intravenous Every 12 hours 04/25/21 1920 04/26/21 0327   04/25/21 1045  vancomycin (VANCOREADY) IVPB 1500 mg/300 mL        1,500 mg 150 mL/hr over 120 Minutes Intravenous On call to O.R. 04/25/21 1030 04/25/21 1616        Patient was given sequential compression devices, early ambulation, and chemoprophylaxis to prevent DVT.  Patient benefited maximally from hospital stay and there were no complications.    Recent vital signs: Patient Vitals for the past 24 hrs:  BP Temp Temp src Pulse Resp SpO2 Height Weight  04/26/21 0500 -- -- -- -- -- -- 5\' 7"  (1.702 m) 127.5 kg  04/26/21 0453 (!) 169/101 97.8 F (36.6 C) Oral 89 20 95 % -- --  04/26/21 0200 (!) 148/85 98 F (36.7 C) Oral 85 20 95 % -- --  04/25/21 2116 (!) 151/66 98.5 F (36.9 C) Oral 77 20 95 % -- --  04/25/21 1905 133/88 97.6 F (36.4 C) Oral 70 16 94  % -- --  04/25/21 1845 (!) 150/82 97.6 F (36.4 C) -- 81 (!) 21 95 % -- --  04/25/21 1830 (!) 143/82 -- -- 73 15 95 % -- --  04/25/21 1815 134/86 97.8 F (36.6 C) -- 79 13 95 % -- --  04/25/21 1814 -- -- -- 78 11 96 % -- --  04/25/21 1800 (!) 145/91 -- -- 84 19 96 % -- --  04/25/21 1745 137/63 -- -- 83 13 95 % -- --  04/25/21 1738 135/80 97.8 F (36.6 C) -- 80 16 96 % -- --  04/25/21 1418 (!) 156/95 -- -- 79 20 98 % -- --  04/25/21 1413 -- -- -- 80 17 97 % -- --  04/25/21 1408 -- -- -- 80 (!) 23 98 % -- --  04/25/21 1403 (!) 166/93 -- -- 80 18 99 % -- --  04/25/21 1358 -- -- -- -- 14 -- -- --     Recent laboratory studies:  Recent Labs    04/26/21 0306  WBC 13.8*  HGB 14.1  HCT 43.5  PLT 205     Discharge Medications:   Allergies as of 04/26/2021       Reactions   Penicillins Hives        Medication List     TAKE these medications    aspirin EC 325 MG tablet Take 1 tablet (325 mg total) by mouth 2 (two) times  daily after a meal. Take x 1 month post op to decrease risk of blood clots.   buPROPion 300 MG 24 hr tablet Commonly known as: WELLBUTRIN XL Take 300 mg by mouth daily.   celecoxib 200 MG capsule Commonly known as: CeleBREX Take 1 capsule (200 mg total) by mouth 2 (two) times daily.   chlorthalidone 25 MG tablet Commonly known as: HYGROTON Take 12.5 mg by mouth in the morning and at bedtime.   docusate sodium 100 MG capsule Commonly known as: Colace Take 1 capsule (100 mg total) by mouth 2 (two) times daily.   GOODY HEADACHE PO Take 1 packet by mouth every 8 (eight) hours as needed (headache).   losartan 100 MG tablet Commonly known as: COZAAR Take 100 mg by mouth daily.   oxyCODONE-acetaminophen 5-325 MG tablet Commonly known as: PERCOCET/ROXICET Take 1-2 tablets by mouth every 6 (six) hours as needed for severe pain.   potassium chloride 10 MEQ tablet Commonly known as: KLOR-CON Take 10 mEq by mouth daily.   tiZANidine 2 MG  tablet Commonly known as: ZANAFLEX Take 1 tablet (2 mg total) by mouth every 8 (eight) hours as needed for muscle spasms.               Durable Medical Equipment  (From admission, onward)           Start     Ordered   04/25/21 1920  DME Walker rolling  Once       Question:  Patient needs a walker to treat with the following condition  Answer:  Primary osteoarthritis of right knee   04/25/21 1920              Discharge Care Instructions  (From admission, onward)           Start     Ordered   04/26/21 0000  Weight bearing as tolerated       Question Answer Comment  Laterality right   Extremity Lower      04/26/21 1104            Diagnostic Studies: DG Chest 2 View  Result Date: 04/20/2021 CLINICAL DATA:  Preoperative evaluation prior to knee surgery on April 25, 2021. No chest complaints. EXAM: CHEST - 2 VIEW COMPARISON:  April 01, 2010 FINDINGS: The heart size and mediastinal contours are within normal limits. Both lungs are clear. The visualized skeletal structures are unremarkable. IMPRESSION: No active cardiopulmonary disease. Electronically Signed   By: Virgina Norfolk M.D.   On: 04/20/2021 08:36    Disposition: Discharge disposition: 01-Home or Self Care       Discharge Instructions     CPM   Complete by: As directed    Continuous passive motion machine (CPM):      Use the CPM from 0 to 60 for 8 hours per day.      You may increase by 5-10 per day.  You may break it up into 2 or 3 sessions per day.      Use CPM for 1-2 weeks or until you are told to stop.   Call MD / Call 911   Complete by: As directed    If you experience chest pain or shortness of breath, CALL 911 and be transported to the hospital emergency room.  If you develope a fever above 101 F, pus (white drainage) or increased drainage or redness at the wound, or calf pain, call your surgeon's office.   Constipation Prevention   Complete  by: As directed    Drink plenty  of fluids.  Prune juice may be helpful.  You may use a stool softener, such as Colace (over the counter) 100 mg twice a day.  Use MiraLax (over the counter) for constipation as needed.   Diet general   Complete by: As directed    Do not put a pillow under the knee. Place it under the heel.   Complete by: As directed    Increase activity slowly as tolerated   Complete by: As directed    Post-operative opioid taper instructions:   Complete by: As directed    POST-OPERATIVE OPIOID TAPER INSTRUCTIONS: It is important to wean off of your opioid medication as soon as possible. If you do not need pain medication after your surgery it is ok to stop day one. Opioids include: Codeine, Hydrocodone(Norco, Vicodin), Oxycodone(Percocet, oxycontin) and hydromorphone amongst others.  Long term and even short term use of opiods can cause: Increased pain response Dependence Constipation Depression Respiratory depression And more.  Withdrawal symptoms can include Flu like symptoms Nausea, vomiting And more Techniques to manage these symptoms Hydrate well Eat regular healthy meals Stay active Use relaxation techniques(deep breathing, meditating, yoga) Do Not substitute Alcohol to help with tapering If you have been on opioids for less than two weeks and do not have pain than it is ok to stop all together.  Plan to wean off of opioids This plan should start within one week post op of your joint replacement. Maintain the same interval or time between taking each dose and first decrease the dose.  Cut the total daily intake of opioids by one tablet each day Next start to increase the time between doses. The last dose that should be eliminated is the evening dose.      Weight bearing as tolerated   Complete by: As directed    Laterality: right   Extremity: Lower        Follow-up Information     Dorna Leitz, MD. Schedule an appointment as soon as possible for a visit in 2 week(s).    Specialty: Orthopedic Surgery Contact information: [address] [place] [address] [phone]                  Signed: Erlene Senters 04/26/2021, 11:05 AM

## 2021-04-26 NOTE — Progress Notes (Signed)
The patient is alert and oriented and has been seen by his physician. The orders for discharge were written. IV has been removed. Went over discharge instructions with patient and family. He is being discharged via wheelchair with all of his belongings.  

## 2021-04-26 NOTE — Progress Notes (Signed)
Physical Therapy Treatment Patient Details Name: [name] MRN: [id number] DOB: 02-Nov-1950 Today's Date: 04/26/2021   History of Present Illness 71 yo male s/p R TK rev 04/25/21    PT Comments    Progressing well with mobility. Reviewed/practiced gait and stair training. All education completed.    Recommendations for follow up therapy are one component of a multi-disciplinary discharge planning process, led by the attending physician.  Recommendations may be updated based on patient status, additional functional criteria and insurance authorization.  Follow Up Recommendations  Follow physician's recommendations for discharge plan and follow up therapies     Assistance Recommended at Discharge PRN  Patient can return home with the following A little help with walking and/or transfers;A little help with bathing/dressing/bathroom;Help with stairs or ramp for entrance;Assist for transportation   Equipment Recommendations  None recommended by PT    Recommendations for Other Services       Precautions / Restrictions Precautions Precautions: Fall;Knee Restrictions Weight Bearing Restrictions: No RLE Weight Bearing: Weight bearing as tolerated     Mobility  Bed Mobility Overal bed mobility: Modified Independent Bed Mobility: Supine to Sit     Supine to sit: Min guard, HOB elevated     General bed mobility comments: Min guard for safety.    Transfers Overall transfer level: Needs assistance Equipment used: Rolling walker (2 wheels) Transfers: Sit to/from Stand Sit to Stand: Supervision           General transfer comment: Min guard for safety. Cues for safety, technique, hand placement.    Ambulation/Gait Ambulation/Gait assistance: Supervision Gait Distance (Feet): 100 Feet Assistive device: Rolling walker (2 wheels) Gait Pattern/deviations: Step-through pattern, Decreased stride length       General Gait Details: Min guard for safety. Pt tolerated  distance well.   Stairs Stairs: Yes Stairs assistance: Min guard Stair Management: Step to pattern, Forwards, Two rails   General stair comments: up and over portable stairs x 2. cues for safety, technique, sequence   Wheelchair Mobility    Modified Rankin (Stroke Patients Only)       Balance Overall balance assessment: Mild deficits observed, not formally tested         Standing balance support: Bilateral upper extremity supported Standing balance-Leahy Scale: Poor                              Cognition Arousal/Alertness: Awake/alert Behavior During Therapy: WFL for tasks assessed/performed Overall Cognitive Status: Within Functional Limits for tasks assessed                                          Exercises Total Joint Exercises Ankle Circles/Pumps: AROM, Both, 10 reps Quad Sets: AROM, Both, 10 reps Hip ABduction/ADduction: AAROM, Right, 10 reps Straight Leg Raises: AROM, Right, 5 reps Knee Flexion: AAROM, Right, 10 reps, Seated Goniometric ROM: ~10-65 degrees    General Comments        Pertinent Vitals/Pain Pain Assessment Pain Assessment: 0-10 Pain Score: 5  Pain Location: R knee Pain Descriptors / Indicators: Discomfort, Sore Pain Intervention(s): Monitored during session, Ice applied, Repositioned    Home Living                          Prior Function  PT Goals (current goals can now be found in the care plan section) Acute Rehab PT Goals Patient Stated Goal: home. regain PLOF/independence PT Goal Formulation: With patient Time For Goal Achievement: 05/10/21 Potential to Achieve Goals: Good Progress towards PT goals: Progressing toward goals    Frequency    7X/week      PT Plan Current plan remains appropriate    Co-evaluation              AM-PAC PT "6 Clicks" Mobility   Outcome Measure  Help needed turning from your back to your side while in a flat bed without using  bedrails?: None Help needed moving from lying on your back to sitting on the side of a flat bed without using bedrails?: None Help needed moving to and from a bed to a chair (including a wheelchair)?: A Little Help needed standing up from a chair using your arms (e.g., wheelchair or bedside chair)?: A Little Help needed to walk in hospital room?: A Little Help needed climbing 3-5 steps with a railing? : A Little 6 Click Score: 20    End of Session Equipment Utilized During Treatment: Gait belt Activity Tolerance: Patient tolerated treatment well Patient left: in bed;with call bell/phone within reach   PT Visit Diagnosis: Other abnormalities of gait and mobility (R26.89) Pain - Right/Left: Right Pain - part of body: Knee     Time: 1430-1440 PT Time Calculation (min) (ACUTE ONLY): 10 min  Charges:  $Gait Training: 8-22 mins                         Doreatha Massed, PT Acute Rehabilitation  Office: [phone] Pager: [phone]

## 2021-04-26 NOTE — Evaluation (Signed)
Physical Therapy Evaluation Patient Details Name: [name] Surgeon MRN: [id number] DOB: Nov 18, 1950 Today's Date: 04/26/2021  History of Present Illness  71 yo male s/p R TK rev 04/25/21  Clinical Impression  On eval, pt was Min guard assist for mobility. He walked ~75 feet with a RW. Moderate pain with activity. Will plan to have a 2nd session prior to possible d/c home later today if meeting PT goals.        Recommendations for follow up therapy are one component of a multi-disciplinary discharge planning process, led by the attending physician.  Recommendations may be updated based on patient status, additional functional criteria and insurance authorization.  Follow Up Recommendations Follow physician's recommendations for discharge plan and follow up therapies    Assistance Recommended at Discharge Frequent or constant Supervision/Assistance  Patient can return home with the following  A little help with walking and/or transfers;A little help with bathing/dressing/bathroom;Help with stairs or ramp for entrance;Assist for transportation    Equipment Recommendations None recommended by PT  Recommendations for Other Services       Functional Status Assessment Patient has had a recent decline in their functional status and demonstrates the ability to make significant improvements in function in a reasonable and predictable amount of time.     Precautions / Restrictions Precautions Precautions: Fall;Knee Restrictions Weight Bearing Restrictions: No RLE Weight Bearing: Weight bearing as tolerated      Mobility  Bed Mobility Overal bed mobility: Needs Assistance Bed Mobility: Supine to Sit     Supine to sit: Min guard, HOB elevated     General bed mobility comments: Min guard for safety.    Transfers Overall transfer level: Needs assistance Equipment used: Rolling walker (2 wheels) Transfers: Sit to/from Stand Sit to Stand: Min guard, From elevated surface            General transfer comment: Min guard for safety. Cues for safety, technique, hand placement.    Ambulation/Gait Ambulation/Gait assistance: Min guard Gait Distance (Feet): 75 Feet Assistive device: Rolling walker (2 wheels) Gait Pattern/deviations: Step-to pattern, Step-through pattern, Decreased stride length       General Gait Details: Min guard for safety. Pt tolerated distance well.  Stairs            Wheelchair Mobility    Modified Rankin (Stroke Patients Only)       Balance Overall balance assessment: Needs assistance         Standing balance support: Bilateral upper extremity supported Standing balance-Leahy Scale: Poor                               Pertinent Vitals/Pain Pain Assessment Pain Assessment: 0-10 Pain Score: 5  Pain Location: R knee Pain Descriptors / Indicators: Discomfort, Sore Pain Intervention(s): Limited activity within patient's tolerance, Monitored during session, Repositioned, Ice applied    Home Living Family/patient expects to be discharged to:: Private residence Living Arrangements: Spouse/significant other   Type of Home: House Home Access: Stairs to enter Entrance Stairs-Rails: Doctor, general practice of Steps: *front-4; side-7 Alternate Level Stairs-Number of Steps: 1 flight Home Layout: Two level Home Equipment: Agricultural consultant (2 wheels);BSC/3in1      Prior Function Prior Level of Function : Independent/Modified Independent                     Hand Dominance        Extremity/Trunk Assessment   Upper Extremity Assessment Upper Extremity  Assessment: Overall WFL for tasks assessed    Lower Extremity Assessment Lower Extremity Assessment: Generalized weakness    Cervical / Trunk Assessment Cervical / Trunk Assessment: Normal  Communication   Communication: No difficulties  Cognition Arousal/Alertness: Awake/alert Behavior During Therapy: WFL for tasks  assessed/performed Overall Cognitive Status: Within Functional Limits for tasks assessed                                          General Comments      Exercises Total Joint Exercises Ankle Circles/Pumps: AROM, Both, 10 reps Quad Sets: AROM, Both, 10 reps Hip ABduction/ADduction: AAROM, Right, 10 reps Straight Leg Raises: AROM, Right, 5 reps Knee Flexion: AAROM, Right, 10 reps, Seated Goniometric ROM: ~10-65 degrees   Assessment/Plan    PT Assessment Patient needs continued PT services  PT Problem List Decreased strength;Decreased range of motion;Decreased mobility;Decreased activity tolerance;Decreased balance;Decreased knowledge of use of DME;Pain       PT Treatment Interventions DME instruction;Gait training;Therapeutic activities;Therapeutic exercise;Patient/family education;Balance training;Stair training;Functional mobility training    PT Goals (Current goals can be found in the Care Plan section)  Acute Rehab PT Goals Patient Stated Goal: home. regain PLOF/independence PT Goal Formulation: With patient Time For Goal Achievement: 05/10/21 Potential to Achieve Goals: Good    Frequency 7X/week     Co-evaluation               AM-PAC PT "6 Clicks" Mobility  Outcome Measure Help needed turning from your back to your side while in a flat bed without using bedrails?: A Little Help needed moving from lying on your back to sitting on the side of a flat bed without using bedrails?: A Little Help needed moving to and from a bed to a chair (including a wheelchair)?: A Little Help needed standing up from a chair using your arms (e.g., wheelchair or bedside chair)?: A Little Help needed to walk in hospital room?: A Little Help needed climbing 3-5 steps with a railing? : A Little 6 Click Score: 18    End of Session Equipment Utilized During Treatment: Gait belt Activity Tolerance: Patient tolerated treatment well Patient left: in chair;with call  bell/phone within reach   PT Visit Diagnosis: Other abnormalities of gait and mobility (R26.89);Pain Pain - Right/Left: Right Pain - part of body: Knee    Time: 1140-1200 PT Time Calculation (min) (ACUTE ONLY): 20 min   Charges:   PT Evaluation $PT Eval Low Complexity: 1 Low             Faye Ramsay, PT Acute Rehabilitation  Office: [phone] Pager: [phone]

## 2021-04-28 DIAGNOSIS — Z9889 Other specified postprocedural states: Secondary | ICD-10-CM | POA: Diagnosis not present

## 2021-04-28 DIAGNOSIS — M25561 Pain in right knee: Secondary | ICD-10-CM | POA: Diagnosis not present

## 2021-04-29 DIAGNOSIS — I10 Essential (primary) hypertension: Secondary | ICD-10-CM | POA: Diagnosis not present

## 2021-04-29 DIAGNOSIS — F32A Depression, unspecified: Secondary | ICD-10-CM | POA: Diagnosis not present

## 2021-04-29 DIAGNOSIS — T84032D Mechanical loosening of internal right knee prosthetic joint, subsequent encounter: Secondary | ICD-10-CM | POA: Diagnosis not present

## 2021-04-29 DIAGNOSIS — Z96652 Presence of left artificial knee joint: Secondary | ICD-10-CM | POA: Diagnosis not present

## 2021-04-29 DIAGNOSIS — Z7982 Long term (current) use of aspirin: Secondary | ICD-10-CM | POA: Diagnosis not present

## 2021-05-01 LAB — AEROBIC/ANAEROBIC CULTURE W GRAM STAIN (SURGICAL/DEEP WOUND)
Culture: NO GROWTH
Gram Stain: NONE SEEN

## 2021-05-02 DIAGNOSIS — T84032D Mechanical loosening of internal right knee prosthetic joint, subsequent encounter: Secondary | ICD-10-CM | POA: Diagnosis not present

## 2021-05-02 DIAGNOSIS — F32A Depression, unspecified: Secondary | ICD-10-CM | POA: Diagnosis not present

## 2021-05-02 DIAGNOSIS — I10 Essential (primary) hypertension: Secondary | ICD-10-CM | POA: Diagnosis not present

## 2021-05-02 DIAGNOSIS — Z7982 Long term (current) use of aspirin: Secondary | ICD-10-CM | POA: Diagnosis not present

## 2021-05-02 DIAGNOSIS — Z96652 Presence of left artificial knee joint: Secondary | ICD-10-CM | POA: Diagnosis not present

## 2021-05-05 DIAGNOSIS — I10 Essential (primary) hypertension: Secondary | ICD-10-CM | POA: Diagnosis not present

## 2021-05-05 DIAGNOSIS — F32A Depression, unspecified: Secondary | ICD-10-CM | POA: Diagnosis not present

## 2021-05-05 DIAGNOSIS — Z7982 Long term (current) use of aspirin: Secondary | ICD-10-CM | POA: Diagnosis not present

## 2021-05-05 DIAGNOSIS — Z96652 Presence of left artificial knee joint: Secondary | ICD-10-CM | POA: Diagnosis not present

## 2021-05-05 DIAGNOSIS — T84032D Mechanical loosening of internal right knee prosthetic joint, subsequent encounter: Secondary | ICD-10-CM | POA: Diagnosis not present

## 2021-05-09 DIAGNOSIS — M25461 Effusion, right knee: Secondary | ICD-10-CM | POA: Diagnosis not present

## 2021-05-09 DIAGNOSIS — Z9889 Other specified postprocedural states: Secondary | ICD-10-CM | POA: Diagnosis not present

## 2021-05-11 DIAGNOSIS — T84032D Mechanical loosening of internal right knee prosthetic joint, subsequent encounter: Secondary | ICD-10-CM | POA: Diagnosis not present

## 2021-05-11 DIAGNOSIS — F32A Depression, unspecified: Secondary | ICD-10-CM | POA: Diagnosis not present

## 2021-05-11 DIAGNOSIS — I10 Essential (primary) hypertension: Secondary | ICD-10-CM | POA: Diagnosis not present

## 2021-05-11 DIAGNOSIS — Z7982 Long term (current) use of aspirin: Secondary | ICD-10-CM | POA: Diagnosis not present

## 2021-05-11 DIAGNOSIS — Z96652 Presence of left artificial knee joint: Secondary | ICD-10-CM | POA: Diagnosis not present

## 2021-05-12 DIAGNOSIS — I10 Essential (primary) hypertension: Secondary | ICD-10-CM | POA: Diagnosis not present

## 2021-05-12 DIAGNOSIS — F32A Depression, unspecified: Secondary | ICD-10-CM | POA: Diagnosis not present

## 2021-05-12 DIAGNOSIS — Z96652 Presence of left artificial knee joint: Secondary | ICD-10-CM | POA: Diagnosis not present

## 2021-05-12 DIAGNOSIS — Z7982 Long term (current) use of aspirin: Secondary | ICD-10-CM | POA: Diagnosis not present

## 2021-05-12 DIAGNOSIS — T84032D Mechanical loosening of internal right knee prosthetic joint, subsequent encounter: Secondary | ICD-10-CM | POA: Diagnosis not present

## 2021-05-18 DIAGNOSIS — M25661 Stiffness of right knee, not elsewhere classified: Secondary | ICD-10-CM | POA: Diagnosis not present

## 2021-05-18 DIAGNOSIS — M25561 Pain in right knee: Secondary | ICD-10-CM | POA: Diagnosis not present

## 2021-05-18 DIAGNOSIS — R531 Weakness: Secondary | ICD-10-CM | POA: Diagnosis not present

## 2021-05-18 DIAGNOSIS — Z96651 Presence of right artificial knee joint: Secondary | ICD-10-CM | POA: Diagnosis not present

## 2021-05-18 DIAGNOSIS — Z4789 Encounter for other orthopedic aftercare: Secondary | ICD-10-CM | POA: Diagnosis not present

## 2021-05-18 DIAGNOSIS — R600 Localized edema: Secondary | ICD-10-CM | POA: Diagnosis not present

## 2021-05-18 DIAGNOSIS — R262 Difficulty in walking, not elsewhere classified: Secondary | ICD-10-CM | POA: Diagnosis not present

## 2021-05-31 DIAGNOSIS — H52223 Regular astigmatism, bilateral: Secondary | ICD-10-CM | POA: Diagnosis not present

## 2021-05-31 DIAGNOSIS — H04123 Dry eye syndrome of bilateral lacrimal glands: Secondary | ICD-10-CM | POA: Diagnosis not present

## 2021-05-31 DIAGNOSIS — H5213 Myopia, bilateral: Secondary | ICD-10-CM | POA: Diagnosis not present

## 2021-05-31 DIAGNOSIS — H524 Presbyopia: Secondary | ICD-10-CM | POA: Diagnosis not present

## 2021-07-12 DIAGNOSIS — Z9889 Other specified postprocedural states: Secondary | ICD-10-CM | POA: Diagnosis not present

## 2021-07-12 DIAGNOSIS — M25461 Effusion, right knee: Secondary | ICD-10-CM | POA: Diagnosis not present

## 2021-08-25 DIAGNOSIS — Z96651 Presence of right artificial knee joint: Secondary | ICD-10-CM | POA: Diagnosis not present

## 2021-08-25 DIAGNOSIS — M25461 Effusion, right knee: Secondary | ICD-10-CM | POA: Diagnosis not present

## 2022-01-12 DIAGNOSIS — M67911 Unspecified disorder of synovium and tendon, right shoulder: Secondary | ICD-10-CM | POA: Diagnosis not present

## 2022-01-12 DIAGNOSIS — M25511 Pain in right shoulder: Secondary | ICD-10-CM | POA: Diagnosis not present

## 2022-01-12 DIAGNOSIS — M25461 Effusion, right knee: Secondary | ICD-10-CM | POA: Diagnosis not present

## 2022-02-13 DIAGNOSIS — M25461 Effusion, right knee: Secondary | ICD-10-CM | POA: Diagnosis not present

## 2022-02-13 DIAGNOSIS — M25561 Pain in right knee: Secondary | ICD-10-CM | POA: Diagnosis not present

## 2022-02-13 DIAGNOSIS — M67911 Unspecified disorder of synovium and tendon, right shoulder: Secondary | ICD-10-CM | POA: Diagnosis not present

## 2022-03-13 DIAGNOSIS — J342 Deviated nasal septum: Secondary | ICD-10-CM | POA: Insufficient documentation

## 2022-03-13 DIAGNOSIS — M26629 Arthralgia of temporomandibular joint, unspecified side: Secondary | ICD-10-CM | POA: Insufficient documentation

## 2022-03-14 ENCOUNTER — Other Ambulatory Visit (HOSPITAL_COMMUNITY): Payer: Self-pay | Admitting: Orthopedic Surgery

## 2022-03-14 DIAGNOSIS — M25561 Pain in right knee: Secondary | ICD-10-CM | POA: Diagnosis not present

## 2022-03-14 DIAGNOSIS — M25461 Effusion, right knee: Secondary | ICD-10-CM | POA: Diagnosis not present

## 2022-03-17 ENCOUNTER — Ambulatory Visit (HOSPITAL_COMMUNITY): Payer: PRIVATE HEALTH INSURANCE

## 2022-03-17 ENCOUNTER — Other Ambulatory Visit (HOSPITAL_COMMUNITY): Payer: PRIVATE HEALTH INSURANCE

## 2022-03-21 ENCOUNTER — Ambulatory Visit (HOSPITAL_COMMUNITY)
Admission: RE | Admit: 2022-03-21 | Discharge: 2022-03-21 | Disposition: A | Discharge status: Home / Self Care | Payer: Medicare Other | Source: Ambulatory Visit | Attending: Orthopedic Surgery | Admitting: Orthopedic Surgery

## 2022-03-21 DIAGNOSIS — M7989 Other specified soft tissue disorders: Secondary | ICD-10-CM | POA: Diagnosis not present

## 2022-03-21 DIAGNOSIS — M25561 Pain in right knee: Secondary | ICD-10-CM | POA: Insufficient documentation

## 2022-03-21 MED ORDER — TECHNETIUM TC 99M MEDRONATE IV KIT
20.0000 | PACK | Freq: Once | INTRAVENOUS | Status: AC | PRN
  Administered 2022-03-21: 20 via INTRAVENOUS

## 2022-03-28 DIAGNOSIS — T8484XD Pain due to internal orthopedic prosthetic devices, implants and grafts, subsequent encounter: Secondary | ICD-10-CM | POA: Diagnosis not present

## 2022-04-03 ENCOUNTER — Other Ambulatory Visit: Payer: Self-pay | Admitting: Otolaryngology

## 2022-04-03 DIAGNOSIS — J342 Deviated nasal septum: Secondary | ICD-10-CM

## 2022-04-03 DIAGNOSIS — M26629 Arthralgia of temporomandibular joint, unspecified side: Secondary | ICD-10-CM

## 2022-04-04 ENCOUNTER — Ambulatory Visit
Admission: RE | Admit: 2022-04-04 | Discharge: 2022-04-04 | Disposition: A | Discharge status: Home / Self Care | Payer: Medicare Other | Source: Ambulatory Visit | Attending: Otolaryngology | Admitting: Otolaryngology

## 2022-04-04 DIAGNOSIS — R221 Localized swelling, mass and lump, neck: Secondary | ICD-10-CM | POA: Diagnosis not present

## 2022-04-04 DIAGNOSIS — J342 Deviated nasal septum: Secondary | ICD-10-CM

## 2022-04-04 DIAGNOSIS — M26629 Arthralgia of temporomandibular joint, unspecified side: Secondary | ICD-10-CM

## 2022-04-18 DIAGNOSIS — M25461 Effusion, right knee: Secondary | ICD-10-CM | POA: Diagnosis not present

## 2022-05-25 ENCOUNTER — Other Ambulatory Visit: Payer: Self-pay | Admitting: Orthopedic Surgery

## 2022-06-01 DIAGNOSIS — J342 Deviated nasal septum: Secondary | ICD-10-CM | POA: Diagnosis not present

## 2022-06-15 DIAGNOSIS — M25561 Pain in right knee: Secondary | ICD-10-CM | POA: Diagnosis not present

## 2022-06-26 ENCOUNTER — Encounter (HOSPITAL_BASED_OUTPATIENT_CLINIC_OR_DEPARTMENT_OTHER): Payer: Self-pay | Admitting: Otolaryngology

## 2022-06-26 ENCOUNTER — Other Ambulatory Visit: Payer: Self-pay

## 2022-06-27 ENCOUNTER — Encounter (HOSPITAL_BASED_OUTPATIENT_CLINIC_OR_DEPARTMENT_OTHER)
Admission: RE | Admit: 2022-06-27 | Discharge: 2022-06-27 | Disposition: A | Discharge status: Home / Self Care | Payer: Medicare Other | Source: Ambulatory Visit | Attending: Otolaryngology | Admitting: Otolaryngology

## 2022-06-27 DIAGNOSIS — Z01818 Encounter for other preprocedural examination: Secondary | ICD-10-CM | POA: Insufficient documentation

## 2022-06-27 LAB — BASIC METABOLIC PANEL
Anion gap: 8 (ref 5–15)
BUN: 20 mg/dL (ref 8–23)
CO2: 26 mmol/L (ref 22–32)
Calcium: 9.2 mg/dL (ref 8.9–10.3)
Chloride: 103 mmol/L (ref 98–111)
Creatinine, Ser: 0.99 mg/dL (ref 0.61–1.24)
GFR, Estimated: >60 mL/min (ref >60)
Glucose, Bld: 110 mg/dL — ABNORMAL HIGH (ref 70–99)
Potassium: 3.8 mmol/L (ref 3.5–5.1)
Sodium: 137 mmol/L (ref 135–145)

## 2022-06-29 NOTE — H&P (Signed)
HPI:  [name] is a 72 y.o. male who presents as a consult Patient.  Referring Provider: Dierdre Forth, *  Chief complaint: Neck mass.  HPI: He has had a soft anterior neck mass for a year or 2. It has not really changed in size. It does not cause any pain or any other symptoms. He has not had any testing on this.  Second problem is difficulty breathing through his nose most of his life. He had some sort of procedure done 30 years ago but it did not really help. He is using Afrin spray periodically.  Third problem is left ear pain when he opens his mouth. He is not able to open widely. This has been going on for about a week.  PMH/Meds/All/SocHx/FamHx/ROS:  Past Medical History: Diagnosis Date  Hypertension  Migraines  Past Surgical History: Procedure Laterality Date  KNEE SURGERY  SHOULDER SURGERY  No family history of bleeding disorders, wound healing problems or difficulty with anesthesia.  Social History  Socioeconomic History  Marital status: Married Spouse name: Not on file  Number of children: Not on file  Years of education: Not on file  Highest education level: Not on file Occupational History  Not on file Tobacco Use  Smoking status: Never  Smokeless tobacco: Never Substance and Sexual Activity  Alcohol use: Yes  Drug use: No  Sexual activity: Not on file Other Topics Concern  Not on file Social History Narrative  Not on file  Social Determinants of Health  Food Insecurity: Not on file Transportation Needs: Not on file Living Situation: Not on file  Current Outpatient Medications:  amLODIPine (NORVASC) 5 MG tablet, Take 1 tablet (5 mg total) by mouth daily., Disp: , Rfl:  buPROPion XL (WELLBUTRIN XL) 300 MG 24 hr tablet, Take by mouth., Disp: , Rfl:  lisinopril- hydrochlorothiazide (PRINZIDE,ZESTORETIC) 10-12.5 mg per tablet, Take 1 tablet by mouth., Disp: , Rfl:  OXcarbazepine (TRILEPTAL) 300 MG tablet, Take 1 tablet (300 mg total)  by mouth 2 times daily., Disp: , Rfl:  SUMAtriptan (IMITREX) 25 MG tablet, Take 1 tablet (25 mg total) by mouth every 2 (two) hours as needed for Migraine. Maximum 200 mg/day, Disp: , Rfl:  A complete ROS was performed with pertinent positives/negatives noted in the HPI. The remainder of the ROS are negative.   Physical Exam:  Ht 1.702 m (5\' 7" )  Wt 117.9 kg (260 lb)  BMI 40.72 kg/m  General: Healthy and alert, in no distress, breathing easily. Normal affect. In a pleasant mood. Head: Normocephalic, atraumatic. No masses, or scars. Eyes: Pupils are equal, and reactive to light. Vision is grossly intact. No spontaneous or gaze nystagmus. Ears: Ear canals are clear. Tympanic membranes are intact, with normal landmarks and the middle ears are clear and healthy. Hearing: Grossly normal. Nose: Nasal cavities are clear with healthy mucosa, no polyps or exudate. Airways are restricted with a severely deviated septum to the right anteriorly. Face: Tenderness left TMJ, restricted mobility and trying to open his mouth. No masses or scars, facial nerve function is symmetric. Oral Cavity: No mucosal abnormalities are noted. Tongue with normal mobility. Dentition appears healthy. Oropharynx: Tonsils are symmetric. There are no mucosal masses identified. Tongue base appears normal and healthy. Larynx/Hypopharynx: deferred Chest: Deferred Neck: 3 cm soft midline anterior neck mass, above the hyoid, otherwise no palpable masses, no cervical adenopathy, no thyroid nodules or enlargement. Neuro: Cranial nerves II-XII with normal function. Balance: Normal gate. Other findings: none.  Independent Review of Additional  Tests or Records: none  Procedures: none  Impression & Plans: 1. Anterior neck mass. Recommend ultrasound evaluation. The 2 most likely diagnoses are benign cyst or lipoma or thyroglossal duct cyst. Will discuss results when available.  2. Left TMJ problem, acute, recommend he talk to  his dentist about this.  3. Septal deviation. He might benefit with nasal septoplasty to help him breathe better. Will discuss this in more detail after the ultrasound.

## 2022-07-03 ENCOUNTER — Ambulatory Visit (HOSPITAL_BASED_OUTPATIENT_CLINIC_OR_DEPARTMENT_OTHER): Payer: Medicare Other | Admitting: Anesthesiology

## 2022-07-03 ENCOUNTER — Encounter (HOSPITAL_BASED_OUTPATIENT_CLINIC_OR_DEPARTMENT_OTHER)
Admission: RE | Disposition: A | Discharge status: Home / Self Care | Payer: Self-pay | Source: Home / Self Care | Attending: Otolaryngology

## 2022-07-03 ENCOUNTER — Ambulatory Visit (HOSPITAL_BASED_OUTPATIENT_CLINIC_OR_DEPARTMENT_OTHER)
Admission: RE | Admit: 2022-07-03 | Discharge: 2022-07-03 | Disposition: A | Discharge status: Home / Self Care | Payer: Medicare Other | Attending: Otolaryngology | Admitting: Otolaryngology

## 2022-07-03 ENCOUNTER — Encounter (HOSPITAL_BASED_OUTPATIENT_CLINIC_OR_DEPARTMENT_OTHER): Payer: Self-pay | Admitting: Otolaryngology

## 2022-07-03 ENCOUNTER — Other Ambulatory Visit: Payer: Self-pay

## 2022-07-03 DIAGNOSIS — Z6841 Body Mass Index (BMI) 40.0 and over, adult: Secondary | ICD-10-CM

## 2022-07-03 DIAGNOSIS — R221 Localized swelling, mass and lump, neck: Secondary | ICD-10-CM | POA: Insufficient documentation

## 2022-07-03 DIAGNOSIS — I10 Essential (primary) hypertension: Secondary | ICD-10-CM | POA: Diagnosis not present

## 2022-07-03 DIAGNOSIS — M26602 Left temporomandibular joint disorder, unspecified: Secondary | ICD-10-CM | POA: Diagnosis not present

## 2022-07-03 DIAGNOSIS — Z79899 Other long term (current) drug therapy: Secondary | ICD-10-CM | POA: Insufficient documentation

## 2022-07-03 DIAGNOSIS — Z01818 Encounter for other preprocedural examination: Secondary | ICD-10-CM

## 2022-07-03 DIAGNOSIS — J342 Deviated nasal septum: Secondary | ICD-10-CM | POA: Insufficient documentation

## 2022-07-03 HISTORY — PX: NASAL SEPTOPLASTY W/ TURBINOPLASTY: SHX2070

## 2022-07-03 SURGERY — SEPTOPLASTY, NOSE, WITH NASAL TURBINATE REDUCTION
Anesthesia: General | Site: Nose | Laterality: Bilateral

## 2022-07-03 MED ORDER — LIDOCAINE HCL (CARDIAC) PF 100 MG/5ML IV SOSY
PREFILLED_SYRINGE | INTRAVENOUS | Status: DC | PRN
  Administered 2022-07-03: 60 mg via INTRAVENOUS

## 2022-07-03 MED ORDER — LIDOCAINE-EPINEPHRINE 1 %-1:100000 IJ SOLN
INTRAMUSCULAR | Status: DC | PRN
  Administered 2022-07-03: 7 mL

## 2022-07-03 MED ORDER — ONDANSETRON HCL 4 MG/2ML IJ SOLN: 4.0000 mg | Freq: Once | INTRAMUSCULAR | Status: DC | PRN

## 2022-07-03 MED ORDER — MIDAZOLAM HCL 5 MG/5ML IJ SOLN
INTRAMUSCULAR | Status: DC | PRN
  Administered 2022-07-03: 2 mg via INTRAVENOUS

## 2022-07-03 MED ORDER — FENTANYL CITRATE (PF) 100 MCG/2ML IJ SOLN
INTRAMUSCULAR | Status: AC
  Filled 2022-07-03: qty 2

## 2022-07-03 MED ORDER — OXYMETAZOLINE HCL 0.05 % NA SOLN
NASAL | Status: DC | PRN
  Administered 2022-07-03: 1 via TOPICAL

## 2022-07-03 MED ORDER — BACITRACIN ZINC 500 UNIT/GM EX OINT
TOPICAL_OINTMENT | CUTANEOUS | Status: DC | PRN
  Administered 2022-07-03: 1 via TOPICAL

## 2022-07-03 MED ORDER — HYDROMORPHONE HCL 1 MG/ML IJ SOLN
0.2500 mg | INTRAMUSCULAR | Status: DC | PRN
  Administered 2022-07-03: 0.5 mg via INTRAVENOUS

## 2022-07-03 MED ORDER — SUGAMMADEX SODIUM 200 MG/2ML IV SOLN
INTRAVENOUS | Status: DC | PRN
  Administered 2022-07-03: 400 mg via INTRAVENOUS

## 2022-07-03 MED ORDER — OXYCODONE HCL 5 MG PO TABS: 5.0000 mg | ORAL_TABLET | Freq: Once | ORAL | Status: DC | PRN

## 2022-07-03 MED ORDER — EPHEDRINE SULFATE (PRESSORS) 50 MG/ML IJ SOLN
INTRAMUSCULAR | Status: DC | PRN
  Administered 2022-07-03 (×2): 10 mg via INTRAVENOUS

## 2022-07-03 MED ORDER — MIDAZOLAM HCL 2 MG/2ML IJ SOLN
INTRAMUSCULAR | Status: AC
  Filled 2022-07-03: qty 2

## 2022-07-03 MED ORDER — PROPOFOL 10 MG/ML IV BOLUS
INTRAVENOUS | Status: AC
  Filled 2022-07-03: qty 20

## 2022-07-03 MED ORDER — KETOROLAC TROMETHAMINE 30 MG/ML IJ SOLN
INTRAMUSCULAR | Status: AC
  Filled 2022-07-03: qty 1

## 2022-07-03 MED ORDER — HYDROMORPHONE HCL 1 MG/ML IJ SOLN
INTRAMUSCULAR | Status: AC
  Filled 2022-07-03: qty 0.5

## 2022-07-03 MED ORDER — KETOROLAC TROMETHAMINE 30 MG/ML IJ SOLN
30.0000 mg | Freq: Once | INTRAMUSCULAR | Status: AC | PRN
  Administered 2022-07-03: 30 mg via INTRAVENOUS

## 2022-07-03 MED ORDER — ONDANSETRON 4 MG PO TBDP: 4.0000 mg | ORAL_TABLET | Freq: Three times a day (TID) | ORAL | 0 refills | Status: DC | PRN

## 2022-07-03 MED ORDER — ROCURONIUM BROMIDE 100 MG/10ML IV SOLN
INTRAVENOUS | Status: DC | PRN
  Administered 2022-07-03: 60 mg via INTRAVENOUS

## 2022-07-03 MED ORDER — PHENYLEPHRINE HCL (PRESSORS) 10 MG/ML IV SOLN
INTRAVENOUS | Status: DC | PRN
  Administered 2022-07-03 (×2): 80 ug via INTRAVENOUS

## 2022-07-03 MED ORDER — ONDANSETRON HCL 4 MG/2ML IJ SOLN
INTRAMUSCULAR | Status: AC
  Filled 2022-07-03: qty 2

## 2022-07-03 MED ORDER — DEXAMETHASONE SODIUM PHOSPHATE 4 MG/ML IJ SOLN
INTRAMUSCULAR | Status: DC | PRN
  Administered 2022-07-03: 10 mg via INTRAVENOUS

## 2022-07-03 MED ORDER — OXYMETAZOLINE HCL 0.05 % NA SOLN
2.0000 | NASAL | Status: DC
  Administered 2022-07-03: 2 via NASAL

## 2022-07-03 MED ORDER — HYDROCODONE-ACETAMINOPHEN 7.5-325 MG PO TABS: 1.0000 | ORAL_TABLET | Freq: Four times a day (QID) | ORAL | 0 refills | Status: DC | PRN

## 2022-07-03 MED ORDER — ROCURONIUM BROMIDE 10 MG/ML (PF) SYRINGE
PREFILLED_SYRINGE | INTRAVENOUS | Status: AC
  Filled 2022-07-03: qty 10

## 2022-07-03 MED ORDER — FENTANYL CITRATE (PF) 100 MCG/2ML IJ SOLN
INTRAMUSCULAR | Status: DC | PRN
  Administered 2022-07-03: 100 ug via INTRAVENOUS

## 2022-07-03 MED ORDER — OXYMETAZOLINE HCL 0.05 % NA SOLN
NASAL | Status: AC
  Filled 2022-07-03: qty 30

## 2022-07-03 MED ORDER — LACTATED RINGERS IV SOLN: INTRAVENOUS | Status: DC

## 2022-07-03 MED ORDER — LIDOCAINE 2% (20 MG/ML) 5 ML SYRINGE
INTRAMUSCULAR | Status: AC
  Filled 2022-07-03: qty 5

## 2022-07-03 MED ORDER — PROPOFOL 10 MG/ML IV BOLUS
INTRAVENOUS | Status: DC | PRN
  Administered 2022-07-03: 200 mg via INTRAVENOUS
  Administered 2022-07-03: 100 mg via INTRAVENOUS

## 2022-07-03 MED ORDER — OXYCODONE HCL 5 MG/5ML PO SOLN: 5.0000 mg | Freq: Once | ORAL | Status: DC | PRN

## 2022-07-03 MED ORDER — DEXAMETHASONE SODIUM PHOSPHATE 10 MG/ML IJ SOLN
INTRAMUSCULAR | Status: AC
  Filled 2022-07-03: qty 1

## 2022-07-03 SURGICAL SUPPLY — 32 items
ATTRACTOMAT 16X20 MAGNETIC DRP (DRAPES) IMPLANT
CANISTER SUCT 1200ML W/VALVE (MISCELLANEOUS) ×1 IMPLANT
COAGULATOR SUCT 8FR VV (MISCELLANEOUS) IMPLANT
COAGULATOR SUCT SWTCH 10FR 6 (ELECTROSURGICAL) IMPLANT
DRSG NASOPORE 8CM (GAUZE/BANDAGES/DRESSINGS) IMPLANT
DRSG TELFA 3X8 NADH STRL (GAUZE/BANDAGES/DRESSINGS) IMPLANT
ELECT REM PT RETURN 9FT ADLT (ELECTROSURGICAL) ×1
ELECTRODE REM PT RTRN 9FT ADLT (ELECTROSURGICAL) ×1 IMPLANT
GAUZE 4X4 16PLY ~~LOC~~+RFID DBL (SPONGE) IMPLANT
GAUZE SPONGE 2X2 STRL 8-PLY (GAUZE/BANDAGES/DRESSINGS) ×1 IMPLANT
GLOVE ECLIPSE 7.5 STRL STRAW (GLOVE) ×1 IMPLANT
GLOVE SURG SS PI 7.0 STRL IVOR (GLOVE) IMPLANT
GOWN STRL REUS W/ TWL LRG LVL3 (GOWN DISPOSABLE) ×2 IMPLANT
GOWN STRL REUS W/TWL LRG LVL3 (GOWN DISPOSABLE) ×2
HEMOSTAT SURGICEL .5X2 ABSORB (HEMOSTASIS) IMPLANT
NDL HYPO 27GX1-1/4 (NEEDLE) ×1 IMPLANT
NEEDLE HYPO 27GX1-1/4 (NEEDLE) ×1 IMPLANT
NS IRRIG 1000ML POUR BTL (IV SOLUTION) IMPLANT
PACK BASIN DAY SURGERY FS (CUSTOM PROCEDURE TRAY) ×1 IMPLANT
PACK ENT DAY SURGERY (CUSTOM PROCEDURE TRAY) ×1 IMPLANT
PATTIES SURGICAL .5 X3 (DISPOSABLE) ×1 IMPLANT
SHEET SILICONE 2X3 0.03 REINF (MISCELLANEOUS) IMPLANT
SLEEVE SCD COMPRESS KNEE MED (STOCKING) ×1 IMPLANT
SPIKE FLUID TRANSFER (MISCELLANEOUS) IMPLANT
STRIP CLOSURE SKIN 1/2X4 (GAUZE/BANDAGES/DRESSINGS) IMPLANT
SUT CHROMIC 4 0 P 3 18 (SUTURE) ×1 IMPLANT
SUT ETHILON 3 0 PS 1 (SUTURE) IMPLANT
SUT ETHILON 4 0 CL P 3 (SUTURE) IMPLANT
SUT ETHILON 6 0 P 1 (SUTURE) IMPLANT
SUT PLAIN 4 0 ~~LOC~~ 1 (SUTURE) ×1 IMPLANT
TOWEL GREEN STERILE FF (TOWEL DISPOSABLE) ×1 IMPLANT
YANKAUER SUCT BULB TIP NO VENT (SUCTIONS) ×1 IMPLANT

## 2022-07-03 NOTE — Anesthesia Procedure Notes (Signed)
Procedure Name: Intubation Date/Time: 07/03/2022 9:33 AM  Performed by: Burna Cash, CRNAPre-anesthesia Checklist: Patient identified, Emergency Drugs available, Suction available and Patient being monitored Patient Re-evaluated:Patient Re-evaluated prior to induction Oxygen Delivery Method: Circle system utilized Preoxygenation: Pre-oxygenation with 100% oxygen Induction Type: IV induction Ventilation: Mask ventilation without difficulty Laryngoscope Size: Glidescope and 4 Grade View: Grade IV Tube type: Oral Tube size: 7.5 mm Number of attempts: 2 Airway Equipment and Method: Stylet and Oral airway Placement Confirmation: ETT inserted through vocal cords under direct vision, positive ETCO2 and breath sounds checked- equal and bilateral Secured at: 22 cm Tube secured with: Tape Dental Injury: Teeth and Oropharynx as per pre-operative assessment  Difficulty Due To: Difficult Airway- due to anterior larynx

## 2022-07-03 NOTE — Anesthesia Preprocedure Evaluation (Signed)
Anesthesia Evaluation  Patient identified by MRN, date of birth, ID band Patient awake    Reviewed: Allergy & Precautions, H&P , NPO status , Patient's Chart, lab work & pertinent test results  Airway Mallampati: II  TM Distance: <3 FB Neck ROM: Full    Dental no notable dental hx.    Pulmonary neg pulmonary ROS   Pulmonary exam normal breath sounds clear to auscultation       Cardiovascular hypertension, Pt. on medications and Pt. on home beta blockers Normal cardiovascular exam Rhythm:Regular Rate:Normal     Neuro/Psych negative neurological ROS  negative psych ROS   GI/Hepatic negative GI ROS, Neg liver ROS,,,  Endo/Other    Morbid obesity  Renal/GU negative Renal ROS  negative genitourinary   Musculoskeletal negative musculoskeletal ROS (+)    Abdominal   Peds negative pediatric ROS (+)  Hematology negative hematology ROS (+)   Anesthesia Other Findings   Reproductive/Obstetrics negative OB ROS                             Anesthesia Physical Anesthesia Plan  ASA: 3  Anesthesia Plan: General   Post-op Pain Management: Minimal or no pain anticipated   Induction: Intravenous  PONV Risk Score and Plan: 2 and Ondansetron, Dexamethasone and Treatment may vary due to age or medical condition  Airway Management Planned: Oral ETT  Additional Equipment:   Intra-op Plan:   Post-operative Plan: Extubation in OR  Informed Consent: I have reviewed the patients History and Physical, chart, labs and discussed the procedure including the risks, benefits and alternatives for the proposed anesthesia with the patient or authorized representative who has indicated his/her understanding and acceptance.     Dental advisory given  Plan Discussed with: CRNA and Surgeon  Anesthesia Plan Comments:        Anesthesia Quick Evaluation

## 2022-07-03 NOTE — Op Note (Signed)
OPERATIVE REPORT  DATE OF SURGERY: 07/03/2022  PATIENT:  [name],  72 y.o. male  PRE-OPERATIVE DIAGNOSIS:  Nasal septal deviation  POST-OPERATIVE DIAGNOSIS:  Nasal septal deviation  PROCEDURE:  Procedure(s): NASAL SEPTOPLASTY WITH TURBINATE REDUCTION  SURGEON:  Susy Frizzle, MD  ASSISTANTS: none  ANESTHESIA:   General   EBL:  50 ml  DRAINS: none  LOCAL MEDICATIONS USED:  1% Xylocaine with epinephrine  SPECIMEN:  none  COUNTS:  Correct  PROCEDURE DETAILS: The patient was taken to the operating room and placed on the operating table in the supine position. Following induction of general endotracheal anesthesia, the nose was prepped and draped in a standard fashion. Afrin spray was used preoperatively in the holding area. 1% Xylocaine with epinephrine was infiltrated into the septum, the columella, and the inferior turbinates bilaterally.  1. Nasal septoplasty. A left hemitransfixion incision was created with a 15 scalpel. A mucoperichondrial flap was developed posteriorly down the left side of the nasal septum using a Cottle elevator. This was performed almost to the sphenoid rostrum.  The majority of the ethmoid plate was previously removed when he had surgery many years ago.  There was a portion of the posterior cartilage that was removed as it was curved towards the right side.  Adequate L strut was maintained.  The caudal cartilage was freed up from the surrounding mucosa anteriorly and from the columellar pocket and a Donnie Coffin morselizer was used to soften the cartilage and allow for more midline position.  About 3 mm was resected off of the caudal edge to facilitate better fit into the columellar pocket.  A bony spur projecting into the left nasal cavity along the vomer was taken down using a 4 mm osteotome and Jansen-Middleton rongeurs.  All of these maneuvers allowed for better midline positioning and improved airways bilaterally.  The septal incision was reapproximated with  interrupted 4-0 chromic suture.  Septal flaps were quilted with 4-0 plain gut.  2.  Outfracture inferior turbinates bilaterally.  Prior turbinate resection has been performed and in order to avoid excessive turbinate loss they were simply outfractured with the Aufrecht retractor.  All of these maneuvers increased the nasal airways bilaterally. The nasal cavities were packed with rolled up Telfa coated with bacitracin ointment. The pharynx was suctioned of blood and secretions under direct visualization. The patient was awakened extubated and transferred to recovery in stable condition.    PATIENT DISPOSITION:  To PACU, stable

## 2022-07-03 NOTE — Transfer of Care (Signed)
Immediate Anesthesia Transfer of Care Note  Patient: [name]  Procedure(s) Performed: NASAL SEPTOPLASTY WITH TURBINATE REDUCTION (Bilateral: Nose)  Patient Location: PACU  Anesthesia Type:General  Level of Consciousness: awake, alert , and oriented  Airway & Oxygen Therapy: Patient Spontanous Breathing and Patient connected to face mask oxygen  Post-op Assessment: Report given to RN and Post -op Vital signs reviewed and stable  Post vital signs: Reviewed and stable  Last Vitals:  Vitals Value Taken Time  BP 138/78 07/03/22 1025  Temp    Pulse 69 07/03/22 1025  Resp 13 07/03/22 1025  SpO2 98 % 07/03/22 1025  Vitals shown include unvalidated device data.  Last Pain:  Vitals:   07/03/22 0805  TempSrc: Oral  PainSc: 0-No pain      Patients Stated Pain Goal: 5 (07/03/22 0805)  Complications: No notable events documented.

## 2022-07-03 NOTE — Discharge Instructions (Addendum)
  Post Anesthesia Home Care Instructions  Activity: Get plenty of rest for the remainder of the day. A responsible individual must stay with you for 24 hours following the procedure.  For the next 24 hours, DO NOT: -Drive a car -Advertising copywriter -Drink alcoholic beverages -Take any medication unless instructed by your physician -Make any legal decisions or sign important papers.  Meals: Start with liquid foods such as gelatin or soup. Progress to regular foods as tolerated. Avoid greasy, spicy, heavy foods. If nausea and/or vomiting occur, drink only clear liquids until the nausea and/or vomiting subsides. Call your physician if vomiting continues.  Special Instructions/Symptoms: Your throat may feel dry or sore from the anesthesia or the breathing tube placed in your throat during surgery. If this causes discomfort, gargle with warm salt water. The discomfort should disappear within 24 hours.  If you had a scopolamine patch placed behind your ear for the management of post- operative nausea and/or vomiting:  1. The medication in the patch is effective for 72 hours, after which it should be removed.  Wrap patch in a tissue and discard in the trash. Wash hands thoroughly with soap and water. 2. You may remove the patch earlier than 72 hours if you experience unpleasant side effects which may include dry mouth, dizziness or visual disturbances. 3. Avoid touching the patch. Wash your hands with soap and water after contact with the patch.     Next dose of NSAIDS (Motrin/Ibuprofen/Aleve) may be given at 4:35pm if needed.

## 2022-07-03 NOTE — Anesthesia Postprocedure Evaluation (Signed)
Anesthesia Post Note  Patient: [name]  Procedure(s) Performed: NASAL SEPTOPLASTY WITH TURBINATE REDUCTION (Bilateral: Nose)     Patient location during evaluation: PACU Anesthesia Type: General Level of consciousness: awake and alert, patient cooperative and oriented Pain management: pain level controlled Vital Signs Assessment: post-procedure vital signs reviewed and stable Respiratory status: spontaneous breathing, nonlabored ventilation and respiratory function stable Cardiovascular status: blood pressure returned to baseline and stable Postop Assessment: no apparent nausea or vomiting, adequate PO intake and able to ambulate Anesthetic complications: no   No notable events documented.  Last Vitals:  Vitals:   07/03/22 1056 07/03/22 1120  BP: 126/72 (!) 136/91  Pulse: 70 70  Resp: 12 20  Temp:  36.7 C  SpO2: 95% 95%    Last Pain:  Vitals:   07/03/22 1120  TempSrc: Temporal  PainSc: 2                  Coleston Dirosa,E. Kinte Trim

## 2022-07-03 NOTE — Interval H&P Note (Signed)
History and Physical Interval Note:  07/03/2022 8:55 AM  [name]  has presented today for surgery, with the diagnosis of Nasal septal deviation.  The various methods of treatment have been discussed with the patient and family. After consideration of risks, benefits and other options for treatment, the patient has consented to  Procedure(s): NASAL SEPTOPLASTY WITH TURBINATE REDUCTION (Bilateral) as a surgical intervention.  The patient's history has been reviewed, patient examined, no change in status, stable for surgery.  I have reviewed the patient's chart and labs.  Questions were answered to the patient's satisfaction.     Serena Colonel

## 2022-07-04 ENCOUNTER — Inpatient Hospital Stay (HOSPITAL_COMMUNITY): Admission: RE | Admit: 2022-07-04 | Payer: PRIVATE HEALTH INSURANCE | Source: Ambulatory Visit

## 2022-07-04 ENCOUNTER — Encounter (HOSPITAL_BASED_OUTPATIENT_CLINIC_OR_DEPARTMENT_OTHER): Payer: Self-pay | Admitting: Otolaryngology

## 2022-07-07 DIAGNOSIS — J342 Deviated nasal septum: Secondary | ICD-10-CM | POA: Diagnosis not present

## 2022-07-10 NOTE — Patient Instructions (Signed)
SURGICAL WAITING ROOM VISITATION  Patients having surgery or a procedure may have no more than 2 support people in the waiting area - these visitors may rotate.    Children under the age of 7 must have an adult with them who is not the patient.  Due to an increase in RSV and influenza rates and associated hospitalizations, children ages 47 and under may not visit patients in [facility] hospitals.  If the patient needs to stay at the hospital during part of their recovery, the visitor guidelines for inpatient rooms apply. Pre-op nurse will coordinate an appropriate time for 1 support person to accompany patient in pre-op.  This support person may not rotate.    Please refer to the [facility] website for the visitor guidelines for Inpatients (after your surgery is over and you are in a regular room).       Your procedure is scheduled on:  07/17/22    Report to [facility] Main Entrance    Report to admitting at  0515 AM   Call this number if you have problems the morning of surgery [phone]   Do not eat food :After Midnight.   After Midnight you may have the following liquids until _ 0415_____ AM DAY OF SURGERY  Water Non-Citrus Juices (without pulp, NO RED-Apple, White grape, White cranberry) Black Coffee (NO MILK/CREAM OR CREAMERS, sugar ok)  Clear Tea (NO MILK/CREAM OR CREAMERS, sugar ok) regular and decaf                             Plain Jell-O (NO RED)                                           Fruit ices (not with fruit pulp, NO RED)                                     Popsicles (NO RED)                                                               Sports drinks like Gatorade (NO RED)                    The day of surgery:  Drink ONE (1) Pre-Surgery Clear Ensure or G2 at   0415A ( have completed by )  the morning of surgery. Drink in one sitting. Do not sip.  This drink was given to you during your hospital  pre-op appointment visit. Nothing else to drink  after completing the  Pre-Surgery Clear Ensure or G2.          If you have questions, please contact your surgeon's office.       Oral Hygiene is also important to reduce your risk of infection.                                    Remember - BRUSH YOUR TEETH THE MORNING OF SURGERY WITH  YOUR REGULAR TOOTHPASTE  DENTURES WILL BE REMOVED PRIOR TO SURGERY PLEASE DO NOT APPLY "Poly grip" OR ADHESIVES!!!   Do NOT smoke after Midnight   Take these medicines the morning of surgery with A SIP OF WATER:  coreg, flomax   DO NOT TAKE ANY ORAL DIABETIC MEDICATIONS DAY OF YOUR SURGERY  Bring CPAP mask and tubing day of surgery.                              You may not have any metal on your body including hair pins, jewelry, and body piercing             Do not wear make-up, lotions, powders, perfumes/cologne, or deodorant  Do not wear nail polish including gel and S&S, artificial/acrylic nails, or any other type of covering on natural nails including finger and toenails. If you have artificial nails, gel coating, etc. that needs to be removed by a nail salon please have this removed prior to surgery or surgery may need to be canceled/ delayed if the surgeon/ anesthesia feels like they are unable to be safely monitored.   Do not shave  48 hours prior to surgery.               Men may shave face and neck.   Do not bring valuables to the hospital. [facility] IS NOT             RESPONSIBLE   FOR VALUABLES.   Contacts, glasses, dentures or bridgework may not be worn into surgery.   Bring small overnight bag day of surgery.   DO NOT BRING YOUR HOME MEDICATIONS TO THE HOSPITAL. PHARMACY WILL DISPENSE MEDICATIONS LISTED ON YOUR MEDICATION LIST TO YOU DURING YOUR ADMISSION IN THE HOSPITAL!    Patients discharged on the day of surgery will not be allowed to drive home.  Someone NEEDS to stay with you for the first 24 hours after anesthesia.   Special Instructions: Bring a copy of your healthcare  power of attorney and living will documents the day of surgery if you haven't scanned them before.              Please read over the following fact sheets you were given: IF YOU HAVE QUESTIONS ABOUT YOUR PRE-OP INSTRUCTIONS PLEASE CALL [phone]   If you received a COVID test during your pre-op visit  it is requested that you wear a mask when out in public, stay away from anyone that may not be feeling well and notify your surgeon if you develop symptoms. If you test positive for Covid or have been in contact with anyone that has tested positive in the last 10 days please notify you surgeon.

## 2022-07-10 NOTE — Progress Notes (Signed)
Anesthesia Review:  PCP: Cardiologist : Chest x-ray : EKG : 06/27/22  Echo : Stress test: Cardiac Cath :  Activity level:  Sleep Study/ CPAP : Fasting Blood Sugar :      / Checks Blood Sugar -- times a day:   Blood Thinner/ Instructions /Last Dose: ASA / Instructions/ Last Dose :    325 mg Aspirin    06/27/22- BMP  Nasal septoplasty - 07/03/22

## 2022-07-11 ENCOUNTER — Encounter (HOSPITAL_COMMUNITY): Payer: Self-pay

## 2022-07-11 ENCOUNTER — Encounter (HOSPITAL_COMMUNITY)
Admission: RE | Admit: 2022-07-11 | Discharge: 2022-07-11 | Disposition: A | Discharge status: Home / Self Care | Payer: Medicare Other | Source: Ambulatory Visit | Attending: Orthopedic Surgery | Admitting: Orthopedic Surgery

## 2022-07-11 ENCOUNTER — Other Ambulatory Visit: Payer: Self-pay

## 2022-07-11 ENCOUNTER — Ambulatory Visit (HOSPITAL_COMMUNITY)
Admission: RE | Admit: 2022-07-11 | Discharge: 2022-07-11 | Disposition: A | Discharge status: Home / Self Care | Payer: Medicare Other | Source: Ambulatory Visit | Attending: Orthopedic Surgery | Admitting: Orthopedic Surgery

## 2022-07-11 VITALS — BP 130/83 | HR 72 | Temp 98.2°F | Resp 16 | Ht 67.0 in | Wt 264.0 lb

## 2022-07-11 DIAGNOSIS — Y798 Miscellaneous orthopedic devices associated with adverse incidents, not elsewhere classified: Secondary | ICD-10-CM | POA: Insufficient documentation

## 2022-07-11 DIAGNOSIS — Z01818 Encounter for other preprocedural examination: Secondary | ICD-10-CM | POA: Insufficient documentation

## 2022-07-11 DIAGNOSIS — T8489XA Other specified complication of internal orthopedic prosthetic devices, implants and grafts, initial encounter: Secondary | ICD-10-CM | POA: Diagnosis not present

## 2022-07-11 DIAGNOSIS — I10 Essential (primary) hypertension: Secondary | ICD-10-CM | POA: Diagnosis not present

## 2022-07-11 DIAGNOSIS — Z96651 Presence of right artificial knee joint: Secondary | ICD-10-CM | POA: Insufficient documentation

## 2022-07-11 DIAGNOSIS — Y838 Other surgical procedures as the cause of abnormal reaction of the patient, or of later complication, without mention of misadventure at the time of the procedure: Secondary | ICD-10-CM | POA: Diagnosis not present

## 2022-07-11 HISTORY — DX: Unspecified osteoarthritis, unspecified site: M19.90

## 2022-07-11 LAB — BASIC METABOLIC PANEL
Anion gap: 9 (ref 5–15)
BUN: 24 mg/dL — ABNORMAL HIGH (ref 8–23)
CO2: 28 mmol/L (ref 22–32)
Calcium: 10 mg/dL (ref 8.9–10.3)
Chloride: 105 mmol/L (ref 98–111)
Creatinine, Ser: 1.12 mg/dL (ref 0.61–1.24)
GFR, Estimated: >60 mL/min (ref >60)
Glucose, Bld: 143 mg/dL — ABNORMAL HIGH (ref 70–99)
Potassium: 4.1 mmol/L (ref 3.5–5.1)
Sodium: 142 mmol/L (ref 135–145)

## 2022-07-11 LAB — CBC
HCT: 45.8 % (ref 39.0–52.0)
Hemoglobin: 14.9 g/dL (ref 13.0–17.0)
MCH: 30.7 pg (ref 26.0–34.0)
MCHC: 32.5 g/dL (ref 30.0–36.0)
MCV: 94.4 fL (ref 80.0–100.0)
Platelets: 240 10*3/uL (ref 150–400)
RBC: 4.85 MIL/uL (ref 4.22–5.81)
RDW: 14.5 % (ref 11.5–15.5)
WBC: 7.2 10*3/uL (ref 4.0–10.5)
nRBC: 0 % (ref 0.0–0.2)

## 2022-07-11 LAB — TYPE AND SCREEN

## 2022-07-11 LAB — SURGICAL PCR SCREEN
MRSA, PCR: NEGATIVE
Staphylococcus aureus: NEGATIVE

## 2022-07-14 NOTE — H&P (Signed)
TOTAL KNEE REVISION ADMISSION H&P  Patient is being admitted for right revision total knee arthroplasty.  Subjective:  Chief Complaint:right knee pain.  HPI: [name], 72 y.o. male, has a history of pain and functional disability in the right knee(s) due to failed previous arthroplasty and patient has failed non-surgical conservative treatments for greater than 12 weeks to include NSAID's and/or analgesics, use of assistive devices, and activity modification. The indications for the revision of the total knee arthroplasty are loosening of one or more components. Onset of symptoms was gradual starting 2 years ago with gradually worsening course since that time.  Prior procedures on the right knee(s) include arthroplasty.  Patient currently rates pain in the right knee(s) at 10 out of 10 with activity. There is night pain, worsening of pain with activity and weight bearing, pain that interferes with activities of daily living, pain with passive range of motion, and joint swelling.  Patient has evidence of prosthetic loosening by imaging studies. This condition presents safety issues increasing the risk of falls. There is no current active infection.  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   Past Medical History:  Diagnosis Date   Arthritis    Hypertension     Past Surgical History:  Procedure Laterality Date   CARPAL TUNNEL RELEASE Bilateral 03/28/2003   COLON SURGERY  03/27/2001   diverticulitis   COLON SURGERY     JOINT REPLACEMENT Bilateral 03/27/2010   knees   NASAL SEPTOPLASTY W/ TURBINOPLASTY Bilateral 07/03/2022   Procedure: NASAL SEPTOPLASTY WITH TURBINATE REDUCTION;  Surgeon: Serena Colonel, MD;  Location: [facility];  Service: ENT;  Laterality: Bilateral;   SHOULDER ARTHROSCOPY Left 06/25/2015   Procedure: ARTHROSCOPY SHOULDER;  Surgeon: Jodi Geralds, MD;  Location: [facility];  Service: Orthopedics;  Laterality: Left;  Left shoulder arthroscopy with subacromial decompression and distal clavicle resection.    SHOULDER OPEN ROTATOR CUFF REPAIR Left 06/25/2015   Procedure: ROTATOR CUFF REPAIR SHOULDER OPEN;  Surgeon: Jodi Geralds, MD;  Location: [facility];  Service: Orthopedics;  Laterality: Left;   TOTAL KNEE REVISION Right 04/25/2021   Procedure: KNEE REVISION two componet quadriceps tendon repair;  Surgeon: Jodi Geralds, MD;  Location: [facility];  Service: Orthopedics;  Laterality: Right;   TRIGGER FINGER RELEASE Right     No current facility-administered medications for this encounter.   Current Outpatient Medications  Medication Sig Dispense Refill Last Dose   Aspirin-Acetaminophen-Caffeine (GOODY HEADACHE PO) Take 1 packet by mouth every 8 (eight) hours as needed (headache).      carvedilol (COREG) 25 MG tablet Take 25 mg by mouth 2 (two) times daily with a meal.      chlorthalidone (HYGROTON) 25 MG tablet Take 25 mg by mouth daily.      finasteride (PROSCAR) 5 MG tablet Take 5 mg by mouth daily.      losartan (COZAAR) 100 MG tablet Take 100 mg by mouth daily.      magnesium oxide (MAG-OX) 400 (240 Mg) MG tablet Take 400 mg by mouth daily.      sodium chloride (OCEAN) 0.65 % SOLN nasal spray Place 1 spray into both nostrils as needed for congestion.      spironolactone (ALDACTONE) 25 MG tablet Take 25 mg by mouth daily.      traZODone (DESYREL) 100 MG tablet Take 100 mg by mouth at  bedtime.      ondansetron (ZOFRAN-ODT) 4 MG disintegrating tablet Take 1 tablet (4 mg total) by mouth every 8 (eight) hours as needed for nausea or vomiting. (Patient not taking: Reported on 07/11/2022) 20 tablet 0 Not Taking   tiZANidine (ZANAFLEX) 2 MG tablet Take 1 tablet (2 mg total) by mouth every 8 (eight) hours as needed for muscle spasms. (Patient not taking: Reported on 07/11/2022) 40 tablet 0 Not Taking   Allergies   Allergen Reactions   Lisinopril Swelling   Penicillins Hives   Amlodipine Swelling and Other (See Comments)    Social History   Tobacco Use   Smoking status: Never   Smokeless tobacco: Not on file  Substance Use Topics   Alcohol use: Yes    Comment: occas.    No family history on file.    Review of Systems  Constitutional: Negative.   HENT: Negative.    Eyes: Negative.   Respiratory: Negative.    Cardiovascular:        HTN  Gastrointestinal: Negative.   Endocrine: Negative.   Genitourinary: Negative.   Musculoskeletal:  Positive for arthralgias.  Allergic/Immunologic: Negative.   Neurological: Negative.   Hematological: Negative.   Psychiatric/Behavioral: Negative.       Objective:  Physical Exam Constitutional:      Appearance: Normal appearance. He is normal weight.  HENT:     Head: Normocephalic and atraumatic.     Nose: Nose normal.  Eyes:     Pupils: Pupils are equal, round, and reactive to light.  Pulmonary:     Effort: Pulmonary effort is normal.  Musculoskeletal:        General: Swelling and tenderness present.     Cervical back: Normal range of motion and neck supple.     Comments: Patient has a 2+ effusion.  Stressed extension and varus and valgus stress causes minimal pain.  Skin is intact and no increased warmth no erythema neurovascular intact distally.  Skin:    General: Skin is warm and dry.  Neurological:     General: No focal deficit present.     Mental Status: He is alert and oriented to person, place, and time. Mental status is at baseline.  Psychiatric:        Mood and Affect: Mood normal.        Behavior: Behavior normal.        Thought Content: Thought content normal.        Judgment: Judgment normal.     Vital signs in last 24 hours:    Labs:  Estimated body mass index is 41.35 kg/m as calculated from the following:   Height as of 07/11/22:  (1.702 m).   Weight as of 07/11/22: 119.7 kg.  Imaging Review Plain  radiographs demonstrate  x-rays of the femoral implant show a clear zone underneath the anterior flange and through the notch region of the Sigma RP implant.  The tibial implants appear to be well placed and well fixed.    Assessment/Plan:  End stage arthritis, right knee(s) with failed previous arthroplasty.   The patient history, physical examination, clinical judgment of the provider and imaging studies are consistent with end stage degenerative joint disease of the right knee(s), previous total knee arthroplasty. Revision total knee arthroplasty is deemed medically necessary. The treatment options including medical management, injection therapy, arthroscopy and revision arthroplasty were discussed at length. The risks and benefits of revision total knee arthroplasty were presented and reviewed. The risks due to aseptic  loosening, infection, stiffness, patella tracking problems, thromboembolic complications and other imponderables were discussed. The patient acknowledged the explanation, agreed to proceed with the plan and consent was signed. Patient is being admitted for inpatient treatment for surgery, pain control, PT, OT, prophylactic antibiotics, VTE prophylaxis, progressive ambulation and ADL's and discharge planning.The patient is planning to be discharged home with home health services

## 2022-07-14 NOTE — Progress Notes (Signed)
Anesthesia Chart Review   Case: [id number] Date/Time: 07/17/22 0700   Procedure: RIGHT TOTAL KNEE REVISION (Right: Knee)   Anesthesia type: Spinal   Pre-op diagnosis: LOOSE RIGHT KNEE REPLACEMENT   Location: [facility]   Surgeons: Gean Birchwood, MD       DISCUSSION:71 y.o. never smoker with h/o HTN, loose right knee replacement scheduled for above procedure 07/17/22 with Dr. Gean Birchwood.   S/p nasal septoplasty and turbinate reduction 07/03/2022, due to deviated septum, mouth breathing.  He had a post op follow up on 4/12, packing removed, pharynx clear, stable, 2 week follow up recommended.    Discussed with Dr. Jean Rosenthal, evaluate DOS.   VS: BP 130/83   Pulse 72   Temp 36.8 C (Oral)   Resp 16   Ht  (1.702 m)   Wt 119.7 kg   SpO2 97%   BMI 41.35 kg/m   PROVIDERS: Dorene Grebe, MD is PCP    LABS: Labs reviewed: Acceptable for surgery. (all labs ordered are listed, but only abnormal results are displayed)  Labs Reviewed  BASIC METABOLIC PANEL - Abnormal; Notable for the following components:      Result Value   Glucose, Bld 143 (*)    BUN 24 (*)    All other components within normal limits  SURGICAL PCR SCREEN  CBC  TYPE AND SCREEN     IMAGES:   EKG:   CV:  Past Medical History:  Diagnosis Date   Arthritis    Hypertension     Past Surgical History:  Procedure Laterality Date   CARPAL TUNNEL RELEASE Bilateral 03/28/2003   COLON SURGERY  03/27/2001   diverticulitis   COLON SURGERY     JOINT REPLACEMENT Bilateral 03/27/2010   knees   NASAL SEPTOPLASTY W/ TURBINOPLASTY Bilateral 07/03/2022   Procedure: NASAL SEPTOPLASTY WITH TURBINATE REDUCTION;  Surgeon: Serena Colonel, MD;  Location: [facility];  Service: ENT;  Laterality: Bilateral;   SHOULDER ARTHROSCOPY Left 06/25/2015   Procedure: ARTHROSCOPY SHOULDER;  Surgeon: Jodi Geralds, MD;  Location: [facility];  Service: Orthopedics;  Laterality: Left;  Left  shoulder arthroscopy with subacromial decompression and distal clavicle resection.    SHOULDER OPEN ROTATOR CUFF REPAIR Left 06/25/2015   Procedure: ROTATOR CUFF REPAIR SHOULDER OPEN;  Surgeon: Jodi Geralds, MD;  Location: [facility];  Service: Orthopedics;  Laterality: Left;   TOTAL KNEE REVISION Right 04/25/2021   Procedure: KNEE REVISION two componet quadriceps tendon repair;  Surgeon: Jodi Geralds, MD;  Location: [facility];  Service: Orthopedics;  Laterality: Right;   TRIGGER FINGER RELEASE Right     MEDICATIONS:  Aspirin-Acetaminophen-Caffeine (GOODY HEADACHE PO)   carvedilol (COREG) 25 MG tablet   chlorthalidone (HYGROTON) 25 MG tablet   finasteride (PROSCAR) 5 MG tablet   losartan (COZAAR) 100 MG tablet   magnesium oxide (MAG-OX) 400 (240 Mg) MG tablet   ondansetron (ZOFRAN-ODT) 4 MG disintegrating tablet   sodium chloride (OCEAN) 0.65 % SOLN nasal spray   spironolactone (ALDACTONE) 25 MG tablet   tiZANidine (ZANAFLEX) 2 MG tablet   traZODone (DESYREL) 100 MG tablet   No current facility-administered medications for this encounter.     Jodell Cipro Ward, PA-C [facility]-Surgical Testing [phone]

## 2022-07-14 NOTE — Anesthesia Preprocedure Evaluation (Signed)
Anesthesia Evaluation  Patient identified by MRN, date of birth, ID band Patient awake    Reviewed: Allergy & Precautions, NPO status , Patient's Chart, lab work & pertinent test results  History of Anesthesia Complications Negative for: history of anesthetic complications  Airway Mallampati: I  TM Distance: >3 FB Neck ROM: Full   Comment: Previous grade IV view with Glidescope 4 (anterior airway), easy mask. Prior to that, grade II view with Glidescope 4. Dental  (+) Dental Advisory Given   Pulmonary neg pulmonary ROS   breath sounds clear to auscultation       Cardiovascular hypertension (carvedilol, chlorthalidone, losartan, spironolactone), Pt. on medications and Pt. 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 There is mild concentric hypertrophy. Systolic function is normal. EF: 60%. Wall motion is normal, no significant valvular abnormalities   Neuro/Psych negative neurological ROS     GI/Hepatic negative GI ROS, Neg liver ROS,,,  Endo/Other    Morbid obesityBMI 41  Renal/GU negative Renal ROS     Musculoskeletal  (+) Arthritis ,    Abdominal  (+) + obese  Peds  Hematology negative hematology ROS (+)   Anesthesia Other Findings S/p nasal septoplasty and turbinate reduction 07/03/2022, due to deviated septum, mouth breathing.  He had a post op follow up on 4/12, packing removed, pharynx clear, stable, 2 week follow up recommended.    Reproductive/Obstetrics                             Anesthesia Physical Anesthesia Plan  ASA: 3  Anesthesia Plan: MAC, Regional and Spinal   Post-op Pain Management: Regional block* and Tylenol PO (pre-op)*   Induction:   PONV Risk Score and Plan: 1 and Ondansetron, Dexamethasone and Treatment may vary due to age or medical condition  Airway Management  Planned: Natural Airway and Simple Face Mask  Additional Equipment:   Intra-op Plan:   Post-operative Plan:   Informed Consent: I have reviewed the patients History and Physical, chart, labs and discussed the procedure including the risks, benefits and alternatives for the proposed anesthesia with the patient or authorized representative who has indicated his/her understanding and acceptance.     Dental advisory given  Plan Discussed with: Anesthesiologist, CRNA and Surgeon  Anesthesia Plan Comments: (See PAT note 07/11/2022.  Discussed potential risks of nerve blocks including, but not limited to, infection, bleeding, nerve damage, seizures, pneumothorax, respiratory depression, and potential failure of the block. Alternatives to nerve blocks discussed. All questions answered.  I have discussed risks of neuraxial anesthesia including but not limited to infection, bleeding, nerve injury, back pain, headache, seizures, and failure of block. Patient denies bleeding disorders and is not currently anticoagulated. Labs have been reviewed. Risks and benefits discussed. All patient's questions answered.   Discussed with patient risks of MAC including, but not limited to, minor pain or discomfort, hearing people in the room, and possible need for backup general anesthesia. Risks for general anesthesia also discussed including, but not limited to, sore throat, hoarse voice, chipped/damaged teeth, injury to vocal cords, nausea and vomiting, allergic reactions, lung infection, heart attack, stroke, and death. All questions answered. )       Anesthesia Quick Evaluation

## 2022-07-16 MED ORDER — TRANEXAMIC ACID 1000 MG/10ML IV SOLN
2000.0000 mg | INTRAVENOUS | Status: DC
  Filled 2022-07-16: qty 20

## 2022-07-16 MED ORDER — VANCOMYCIN HCL 1500 MG/300ML IV SOLN: 1500.0000 mg | INTRAVENOUS | Status: DC

## 2022-07-17 ENCOUNTER — Encounter (HOSPITAL_COMMUNITY): Payer: Self-pay | Admitting: Orthopedic Surgery

## 2022-07-17 ENCOUNTER — Inpatient Hospital Stay (HOSPITAL_COMMUNITY)
Admission: RE | Admit: 2022-07-17 | Discharge: 2022-07-18 | DRG: 467 | Disposition: A | Discharge status: Home / Self Care | Payer: Medicare Other | Attending: Orthopedic Surgery | Admitting: Orthopedic Surgery

## 2022-07-17 ENCOUNTER — Inpatient Hospital Stay (HOSPITAL_COMMUNITY): Payer: Medicare Other

## 2022-07-17 ENCOUNTER — Inpatient Hospital Stay (HOSPITAL_COMMUNITY): Payer: Medicare Other | Admitting: Physician Assistant

## 2022-07-17 ENCOUNTER — Other Ambulatory Visit: Payer: Self-pay

## 2022-07-17 ENCOUNTER — Inpatient Hospital Stay (HOSPITAL_COMMUNITY): Payer: Medicare Other | Admitting: Certified Registered"

## 2022-07-17 ENCOUNTER — Encounter (HOSPITAL_COMMUNITY)
Admission: RE | Disposition: A | Discharge status: Home / Self Care | Payer: Self-pay | Source: Home / Self Care | Attending: Orthopedic Surgery

## 2022-07-17 DIAGNOSIS — Z880 Allergy status to penicillin: Secondary | ICD-10-CM

## 2022-07-17 DIAGNOSIS — I10 Essential (primary) hypertension: Secondary | ICD-10-CM | POA: Diagnosis present

## 2022-07-17 DIAGNOSIS — M1711 Unilateral primary osteoarthritis, right knee: Secondary | ICD-10-CM | POA: Diagnosis not present

## 2022-07-17 DIAGNOSIS — I440 Atrioventricular block, first degree: Secondary | ICD-10-CM | POA: Diagnosis not present

## 2022-07-17 DIAGNOSIS — T84032A Mechanical loosening of internal right knee prosthetic joint, initial encounter: Secondary | ICD-10-CM

## 2022-07-17 DIAGNOSIS — Y831 Surgical operation with implant of artificial internal device as the cause of abnormal reaction of the patient, or of later complication, without mention of misadventure at the time of the procedure: Secondary | ICD-10-CM | POA: Diagnosis present

## 2022-07-17 DIAGNOSIS — R7303 Prediabetes: Secondary | ICD-10-CM | POA: Diagnosis not present

## 2022-07-17 DIAGNOSIS — Z79899 Other long term (current) drug therapy: Secondary | ICD-10-CM

## 2022-07-17 DIAGNOSIS — G8918 Other acute postprocedural pain: Secondary | ICD-10-CM | POA: Diagnosis not present

## 2022-07-17 DIAGNOSIS — Z6841 Body Mass Index (BMI) 40.0 and over, adult: Secondary | ICD-10-CM

## 2022-07-17 DIAGNOSIS — Z01818 Encounter for other preprocedural examination: Secondary | ICD-10-CM

## 2022-07-17 DIAGNOSIS — Z888 Allergy status to other drugs, medicaments and biological substances status: Secondary | ICD-10-CM

## 2022-07-17 DIAGNOSIS — Z96651 Presence of right artificial knee joint: Secondary | ICD-10-CM | POA: Diagnosis not present

## 2022-07-17 DIAGNOSIS — Y792 Prosthetic and other implants, materials and accessory orthopedic devices associated with adverse incidents: Secondary | ICD-10-CM | POA: Diagnosis present

## 2022-07-17 DIAGNOSIS — G4730 Sleep apnea, unspecified: Secondary | ICD-10-CM | POA: Diagnosis present

## 2022-07-17 DIAGNOSIS — D62 Acute posthemorrhagic anemia: Secondary | ICD-10-CM | POA: Diagnosis present

## 2022-07-17 DIAGNOSIS — Z471 Aftercare following joint replacement surgery: Secondary | ICD-10-CM | POA: Diagnosis not present

## 2022-07-17 HISTORY — PX: TOTAL KNEE REVISION: SHX996

## 2022-07-17 LAB — TYPE AND SCREEN
ABO/RH(D): O NEG
Antibody Screen: NEGATIVE

## 2022-07-17 SURGERY — TOTAL KNEE REVISION
Anesthesia: Monitor Anesthesia Care | Site: Knee | Laterality: Right

## 2022-07-17 MED ORDER — FENTANYL CITRATE (PF) 100 MCG/2ML IJ SOLN
INTRAMUSCULAR | Status: AC
  Filled 2022-07-17: qty 2

## 2022-07-17 MED ORDER — BUPIVACAINE LIPOSOME 1.3 % IJ SUSP
INTRAMUSCULAR | Status: AC
  Filled 2022-07-17: qty 20

## 2022-07-17 MED ORDER — SODIUM CHLORIDE 0.9 % IR SOLN
Status: DC | PRN
  Administered 2022-07-17: 3000 mL

## 2022-07-17 MED ORDER — BUPIVACAINE LIPOSOME 1.3 % IJ SUSP
INTRAMUSCULAR | Status: DC | PRN
  Administered 2022-07-17: 20 mL

## 2022-07-17 MED ORDER — ONDANSETRON HCL 4 MG/2ML IJ SOLN: 4.0000 mg | Freq: Four times a day (QID) | INTRAMUSCULAR | Status: DC | PRN

## 2022-07-17 MED ORDER — OXYCODONE HCL 5 MG/5ML PO SOLN: 5.0000 mg | Freq: Once | ORAL | Status: DC | PRN

## 2022-07-17 MED ORDER — CHLORTHALIDONE 25 MG PO TABS
25.0000 mg | ORAL_TABLET | Freq: Every day | ORAL | Status: DC
  Administered 2022-07-18: 25 mg via ORAL
  Filled 2022-07-17: qty 1

## 2022-07-17 MED ORDER — ACETAMINOPHEN 500 MG PO TABS
1000.0000 mg | ORAL_TABLET | Freq: Four times a day (QID) | ORAL | Status: AC
  Administered 2022-07-17 – 2022-07-18 (×4): 1000 mg via ORAL
  Filled 2022-07-17 (×4): qty 2

## 2022-07-17 MED ORDER — PROPOFOL 500 MG/50ML IV EMUL
INTRAVENOUS | Status: DC | PRN
  Administered 2022-07-17: 75 ug/kg/min via INTRAVENOUS

## 2022-07-17 MED ORDER — ONDANSETRON HCL 4 MG/2ML IJ SOLN
INTRAMUSCULAR | Status: DC | PRN
  Administered 2022-07-17: 4 mg via INTRAVENOUS

## 2022-07-17 MED ORDER — LACTATED RINGERS IV SOLN: INTRAVENOUS | Status: DC

## 2022-07-17 MED ORDER — PHENOL 1.4 % MT LIQD: 1.0000 | OROMUCOSAL | Status: DC | PRN

## 2022-07-17 MED ORDER — VANCOMYCIN HCL 1500 MG/300ML IV SOLN: 1500.0000 mg | INTRAVENOUS | Status: DC

## 2022-07-17 MED ORDER — FLEET ENEMA 7-19 GM/118ML RE ENEM: 1.0000 | ENEMA | Freq: Once | RECTAL | Status: DC | PRN

## 2022-07-17 MED ORDER — ASPIRIN 81 MG PO TBEC: 81.0000 mg | DELAYED_RELEASE_TABLET | Freq: Two times a day (BID) | ORAL | 0 refills | Status: AC

## 2022-07-17 MED ORDER — TIZANIDINE HCL 2 MG PO TABS: 2.0000 mg | ORAL_TABLET | Freq: Four times a day (QID) | ORAL | 0 refills | Status: DC | PRN

## 2022-07-17 MED ORDER — DEXAMETHASONE SODIUM PHOSPHATE 10 MG/ML IJ SOLN
INTRAMUSCULAR | Status: DC | PRN
  Administered 2022-07-17: 4 mg via INTRAVENOUS

## 2022-07-17 MED ORDER — METHOCARBAMOL 500 MG PO TABS
500.0000 mg | ORAL_TABLET | Freq: Four times a day (QID) | ORAL | Status: DC | PRN
  Administered 2022-07-17 – 2022-07-18 (×2): 500 mg via ORAL
  Filled 2022-07-17 (×2): qty 1

## 2022-07-17 MED ORDER — FENTANYL CITRATE (PF) 100 MCG/2ML IJ SOLN
INTRAMUSCULAR | Status: DC | PRN
  Administered 2022-07-17: 25 ug via INTRAVENOUS
  Administered 2022-07-17: 50 ug via INTRAVENOUS

## 2022-07-17 MED ORDER — MIDAZOLAM HCL 2 MG/2ML IJ SOLN
INTRAMUSCULAR | Status: AC
  Filled 2022-07-17: qty 2

## 2022-07-17 MED ORDER — ONDANSETRON HCL 4 MG PO TABS: 4.0000 mg | ORAL_TABLET | Freq: Four times a day (QID) | ORAL | Status: DC | PRN

## 2022-07-17 MED ORDER — HYDROMORPHONE HCL 2 MG PO TABS: 2.0000 mg | ORAL_TABLET | ORAL | 0 refills | Status: DC | PRN

## 2022-07-17 MED ORDER — HYDROMORPHONE HCL 2 MG PO TABS
2.0000 mg | ORAL_TABLET | ORAL | Status: DC | PRN
  Administered 2022-07-18 (×2): 2 mg via ORAL
  Filled 2022-07-17 (×3): qty 1

## 2022-07-17 MED ORDER — ACETAMINOPHEN 325 MG PO TABS: 325.0000 mg | ORAL_TABLET | Freq: Four times a day (QID) | ORAL | Status: DC | PRN

## 2022-07-17 MED ORDER — PROPOFOL 500 MG/50ML IV EMUL
INTRAVENOUS | Status: AC
  Filled 2022-07-17: qty 50

## 2022-07-17 MED ORDER — SPIRONOLACTONE 25 MG PO TABS
25.0000 mg | ORAL_TABLET | Freq: Every day | ORAL | Status: DC
  Administered 2022-07-18: 25 mg via ORAL
  Filled 2022-07-17: qty 1

## 2022-07-17 MED ORDER — LIDOCAINE 2% (20 MG/ML) 5 ML SYRINGE
INTRAMUSCULAR | Status: DC | PRN
  Administered 2022-07-17: 40 mg via INTRAVENOUS

## 2022-07-17 MED ORDER — PHENYLEPHRINE HCL-NACL 20-0.9 MG/250ML-% IV SOLN
INTRAVENOUS | Status: DC | PRN
  Administered 2022-07-17: 20 ug/min via INTRAVENOUS

## 2022-07-17 MED ORDER — MENTHOL 3 MG MT LOZG: 1.0000 | LOZENGE | OROMUCOSAL | Status: DC | PRN

## 2022-07-17 MED ORDER — FINASTERIDE 5 MG PO TABS
5.0000 mg | ORAL_TABLET | Freq: Every day | ORAL | Status: DC
  Administered 2022-07-18: 5 mg via ORAL
  Filled 2022-07-17: qty 1

## 2022-07-17 MED ORDER — OXYCODONE HCL 5 MG PO TABS: 5.0000 mg | ORAL_TABLET | Freq: Once | ORAL | Status: DC | PRN

## 2022-07-17 MED ORDER — CEFAZOLIN IN SODIUM CHLORIDE 3-0.9 GM/100ML-% IV SOLN
3.0000 g | Freq: Once | INTRAVENOUS | Status: AC
  Administered 2022-07-17: 3 g via INTRAVENOUS

## 2022-07-17 MED ORDER — HYDROMORPHONE HCL 2 MG PO TABS
1.0000 mg | ORAL_TABLET | ORAL | Status: DC | PRN
  Administered 2022-07-17 – 2022-07-18 (×4): 2 mg via ORAL
  Filled 2022-07-17 (×3): qty 1

## 2022-07-17 MED ORDER — PROPOFOL 10 MG/ML IV BOLUS
INTRAVENOUS | Status: DC | PRN
  Administered 2022-07-17: 20 mg via INTRAVENOUS

## 2022-07-17 MED ORDER — SODIUM CHLORIDE (PF) 0.9 % IJ SOLN
INTRAMUSCULAR | Status: AC
  Filled 2022-07-17: qty 50

## 2022-07-17 MED ORDER — METOCLOPRAMIDE HCL 5 MG/ML IJ SOLN: 5.0000 mg | Freq: Three times a day (TID) | INTRAMUSCULAR | Status: DC | PRN

## 2022-07-17 MED ORDER — EPINEPHRINE PF 1 MG/ML IJ SOLN
INTRAMUSCULAR | Status: AC
  Filled 2022-07-17: qty 1

## 2022-07-17 MED ORDER — FENTANYL CITRATE PF 50 MCG/ML IJ SOSY
PREFILLED_SYRINGE | INTRAMUSCULAR | Status: AC
  Administered 2022-07-17: 50 ug via INTRAVENOUS
  Filled 2022-07-17: qty 2

## 2022-07-17 MED ORDER — EPHEDRINE 5 MG/ML INJ
INTRAVENOUS | Status: AC
  Filled 2022-07-17: qty 5

## 2022-07-17 MED ORDER — 0.9 % SODIUM CHLORIDE (POUR BTL) OPTIME
TOPICAL | Status: DC | PRN
  Administered 2022-07-17: 1000 mL

## 2022-07-17 MED ORDER — TRANEXAMIC ACID-NACL 1000-0.7 MG/100ML-% IV SOLN
1000.0000 mg | INTRAVENOUS | Status: AC
  Administered 2022-07-17: 1000 mg via INTRAVENOUS
  Filled 2022-07-17 (×2): qty 100

## 2022-07-17 MED ORDER — ACETAMINOPHEN 500 MG PO TABS
1000.0000 mg | ORAL_TABLET | Freq: Once | ORAL | Status: AC
  Administered 2022-07-17: 1000 mg via ORAL
  Filled 2022-07-17: qty 2

## 2022-07-17 MED ORDER — ALUM & MAG HYDROXIDE-SIMETH 200-200-20 MG/5ML PO SUSP: 30.0000 mL | ORAL | Status: DC | PRN

## 2022-07-17 MED ORDER — BUPIVACAINE LIPOSOME 1.3 % IJ SUSP
20.0000 mL | Freq: Once | INTRAMUSCULAR | Status: DC
  Filled 2022-07-17: qty 20

## 2022-07-17 MED ORDER — ORAL CARE MOUTH RINSE: 15.0000 mL | Freq: Once | OROMUCOSAL | Status: AC

## 2022-07-17 MED ORDER — HYDROMORPHONE HCL 1 MG/ML IJ SOLN
0.5000 mg | INTRAMUSCULAR | Status: DC | PRN
  Administered 2022-07-17 (×2): 1 mg via INTRAVENOUS
  Filled 2022-07-17 (×2): qty 1

## 2022-07-17 MED ORDER — TRANEXAMIC ACID-NACL 1000-0.7 MG/100ML-% IV SOLN
1000.0000 mg | Freq: Once | INTRAVENOUS | Status: AC
  Administered 2022-07-17: 1000 mg via INTRAVENOUS
  Filled 2022-07-17: qty 100

## 2022-07-17 MED ORDER — ROPIVACAINE HCL 7.5 MG/ML IJ SOLN
INTRAMUSCULAR | Status: DC | PRN
  Administered 2022-07-17: 20 mL via PERINEURAL

## 2022-07-17 MED ORDER — BUPIVACAINE-EPINEPHRINE 0.25% -1:200000 IJ SOLN
INTRAMUSCULAR | Status: DC | PRN
  Administered 2022-07-17: 30 mL

## 2022-07-17 MED ORDER — METHOCARBAMOL 500 MG IVPB - SIMPLE MED
500.0000 mg | Freq: Four times a day (QID) | INTRAVENOUS | Status: DC | PRN
  Administered 2022-07-17: 500 mg via INTRAVENOUS

## 2022-07-17 MED ORDER — METHOCARBAMOL 500 MG IVPB - SIMPLE MED
INTRAVENOUS | Status: AC
  Filled 2022-07-17: qty 55

## 2022-07-17 MED ORDER — LOSARTAN POTASSIUM 50 MG PO TABS
100.0000 mg | ORAL_TABLET | Freq: Every day | ORAL | Status: DC
  Administered 2022-07-18: 100 mg via ORAL
  Filled 2022-07-17: qty 2

## 2022-07-17 MED ORDER — ONDANSETRON HCL 4 MG/2ML IJ SOLN
INTRAMUSCULAR | Status: AC
  Filled 2022-07-17: qty 2

## 2022-07-17 MED ORDER — ASPIRIN 81 MG PO CHEW
81.0000 mg | CHEWABLE_TABLET | Freq: Two times a day (BID) | ORAL | Status: DC
  Administered 2022-07-17 – 2022-07-18 (×2): 81 mg via ORAL
  Filled 2022-07-17 (×2): qty 1

## 2022-07-17 MED ORDER — FENTANYL CITRATE PF 50 MCG/ML IJ SOSY
25.0000 ug | PREFILLED_SYRINGE | INTRAMUSCULAR | Status: DC | PRN
  Administered 2022-07-17: 50 ug via INTRAVENOUS

## 2022-07-17 MED ORDER — SODIUM CHLORIDE (PF) 0.9 % IJ SOLN
INTRAMUSCULAR | Status: DC | PRN
  Administered 2022-07-17: 50 mL

## 2022-07-17 MED ORDER — BUPIVACAINE IN DEXTROSE 0.75-8.25 % IT SOLN
INTRATHECAL | Status: DC | PRN
  Administered 2022-07-17: 2 mL via INTRATHECAL

## 2022-07-17 MED ORDER — MIDAZOLAM HCL 2 MG/2ML IJ SOLN
INTRAMUSCULAR | Status: DC | PRN
  Administered 2022-07-17: 2 mg via INTRAVENOUS

## 2022-07-17 MED ORDER — LIDOCAINE HCL (PF) 2 % IJ SOLN
INTRAMUSCULAR | Status: AC
  Filled 2022-07-17: qty 5

## 2022-07-17 MED ORDER — POLYETHYLENE GLYCOL 3350 17 G PO PACK: 17.0000 g | PACK | Freq: Every day | ORAL | Status: DC | PRN

## 2022-07-17 MED ORDER — CHLORHEXIDINE GLUCONATE 0.12 % MT SOLN
15.0000 mL | Freq: Once | OROMUCOSAL | Status: AC
  Administered 2022-07-17: 15 mL via OROMUCOSAL

## 2022-07-17 MED ORDER — PROPOFOL 10 MG/ML IV BOLUS
INTRAVENOUS | Status: AC
  Filled 2022-07-17: qty 20

## 2022-07-17 MED ORDER — EPHEDRINE SULFATE-NACL 50-0.9 MG/10ML-% IV SOSY
PREFILLED_SYRINGE | INTRAVENOUS | Status: DC | PRN
  Administered 2022-07-17: 10 mg via INTRAVENOUS
  Administered 2022-07-17 (×3): 5 mg via INTRAVENOUS

## 2022-07-17 MED ORDER — AMISULPRIDE (ANTIEMETIC) 5 MG/2ML IV SOLN: 10.0000 mg | Freq: Once | INTRAVENOUS | Status: DC | PRN

## 2022-07-17 MED ORDER — DIPHENHYDRAMINE HCL 12.5 MG/5ML PO ELIX: 12.5000 mg | ORAL_SOLUTION | ORAL | Status: DC | PRN

## 2022-07-17 MED ORDER — POVIDONE-IODINE 10 % EX SWAB: 2.0000 | Freq: Once | CUTANEOUS | Status: DC

## 2022-07-17 MED ORDER — METOCLOPRAMIDE HCL 5 MG PO TABS: 5.0000 mg | ORAL_TABLET | Freq: Three times a day (TID) | ORAL | Status: DC | PRN

## 2022-07-17 MED ORDER — TRANEXAMIC ACID 1000 MG/10ML IV SOLN
INTRAVENOUS | Status: DC | PRN
  Administered 2022-07-17: 2000 mg via TOPICAL

## 2022-07-17 MED ORDER — DOCUSATE SODIUM 100 MG PO CAPS
100.0000 mg | ORAL_CAPSULE | Freq: Two times a day (BID) | ORAL | Status: DC
  Administered 2022-07-17 – 2022-07-18 (×2): 100 mg via ORAL
  Filled 2022-07-17 (×2): qty 1

## 2022-07-17 MED ORDER — BUPIVACAINE-EPINEPHRINE (PF) 0.5% -1:200000 IJ SOLN
INTRAMUSCULAR | Status: AC
  Filled 2022-07-17: qty 30

## 2022-07-17 MED ORDER — DEXAMETHASONE SODIUM PHOSPHATE 10 MG/ML IJ SOLN
INTRAMUSCULAR | Status: AC
  Filled 2022-07-17: qty 1

## 2022-07-17 MED ORDER — CEFAZOLIN IN SODIUM CHLORIDE 3-0.9 GM/100ML-% IV SOLN
INTRAVENOUS | Status: AC
  Filled 2022-07-17: qty 100

## 2022-07-17 MED ORDER — TRAZODONE HCL 100 MG PO TABS
100.0000 mg | ORAL_TABLET | Freq: Every day | ORAL | Status: DC
  Administered 2022-07-17: 100 mg via ORAL
  Filled 2022-07-17: qty 1

## 2022-07-17 MED ORDER — BISACODYL 5 MG PO TBEC: 5.0000 mg | DELAYED_RELEASE_TABLET | Freq: Every day | ORAL | Status: DC | PRN

## 2022-07-17 MED ORDER — PROPOFOL 1000 MG/100ML IV EMUL
INTRAVENOUS | Status: AC
  Filled 2022-07-17: qty 100

## 2022-07-17 MED ORDER — BUPIVACAINE HCL (PF) 0.25 % IJ SOLN
INTRAMUSCULAR | Status: AC
  Filled 2022-07-17: qty 30

## 2022-07-17 MED ORDER — PANTOPRAZOLE SODIUM 40 MG PO TBEC
40.0000 mg | DELAYED_RELEASE_TABLET | Freq: Every day | ORAL | Status: DC
  Administered 2022-07-17 – 2022-07-18 (×2): 40 mg via ORAL
  Filled 2022-07-17 (×2): qty 1

## 2022-07-17 MED ORDER — KCL IN DEXTROSE-NACL 20-5-0.45 MEQ/L-%-% IV SOLN
INTRAVENOUS | Status: DC
  Filled 2022-07-17 (×2): qty 1000

## 2022-07-17 MED ORDER — CARVEDILOL 25 MG PO TABS
25.0000 mg | ORAL_TABLET | Freq: Two times a day (BID) | ORAL | Status: DC
  Administered 2022-07-17 – 2022-07-18 (×2): 25 mg via ORAL
  Filled 2022-07-17 (×2): qty 1

## 2022-07-17 SURGICAL SUPPLY — 77 items
ADAPTER BOLT FEMORAL +2/-2 (Knees) IMPLANT
ADPR FEM +2/-2 OFST BOLT (Knees) ×1 IMPLANT
ADPR FEM 5D STRL KN PFC SGM (Orthopedic Implant) ×1 IMPLANT
BAG COUNTER SPONGE SURGICOUNT (BAG) IMPLANT
BAG DECANTER FOR FLEXI CONT (MISCELLANEOUS) ×1 IMPLANT
BAG SPEC THK2 15X12 ZIP CLS (MISCELLANEOUS) ×1
BAG SPNG CNTER NS LX DISP (BAG) ×1
BAG ZIPLOCK 12X15 (MISCELLANEOUS) ×1 IMPLANT
BLADE OSCILLATING/SAGITTAL (BLADE)
BLADE SAG 18X100X1.27 (BLADE) ×1 IMPLANT
BLADE SAW SGTL 11.0X1.19X90.0M (BLADE) ×1 IMPLANT
BLADE SAW SGTL 81X20 HD (BLADE) ×1 IMPLANT
BLADE SURG SZ10 CARB STEEL (BLADE) ×2 IMPLANT
BLADE SW THK.38XMED LNG THN (BLADE) ×2 IMPLANT
BNDG CMPR MED 10X6 ELC LF (GAUZE/BANDAGES/DRESSINGS) ×1
BNDG CMPR MED 15X6 ELC VLCR LF (GAUZE/BANDAGES/DRESSINGS) ×1
BNDG ELASTIC 6X10 VLCR STRL LF (GAUZE/BANDAGES/DRESSINGS) ×2 IMPLANT
BNDG ELASTIC 6X15 VLCR STRL LF (GAUZE/BANDAGES/DRESSINGS) IMPLANT
BOWL SMART MIX CTS (DISPOSABLE) ×1 IMPLANT
BRUSH FEMORAL CANAL (MISCELLANEOUS) ×1 IMPLANT
BUR OVAL CARBIDE 4.0 (BURR) IMPLANT
CEMENT HV SMART SET (Cement) ×2 IMPLANT
COVER SURGICAL LIGHT HANDLE (MISCELLANEOUS) ×1 IMPLANT
CUFF TOURN SGL QUICK 34 (TOURNIQUET CUFF) ×1
CUFF TRNQT CYL 34X4.125X (TOURNIQUET CUFF) ×1 IMPLANT
DRAPE INCISE IOBAN 66X45 STRL (DRAPES) IMPLANT
DRAPE ORTHO SPLIT 77X108 STRL (DRAPES)
DRAPE SURG ORHT 6 SPLT 77X108 (DRAPES) IMPLANT
DRAPE U-SHAPE 47X51 STRL (DRAPES) ×1 IMPLANT
DRESSING AQUACEL AG SP 3.5X10 (GAUZE/BANDAGES/DRESSINGS) IMPLANT
DRSG AQUACEL AG ADV 3.5X14 (GAUZE/BANDAGES/DRESSINGS) IMPLANT
DRSG AQUACEL AG SP 3.5X10 (GAUZE/BANDAGES/DRESSINGS)
DURAPREP 26ML APPLICATOR (WOUND CARE) ×1 IMPLANT
ELECT REM PT RETURN 15FT ADLT (MISCELLANEOUS) ×1 IMPLANT
FEM TC3 PFC SIGMA SZ5 (Orthopedic Implant) ×1 IMPLANT
FEMORAL ADAPTER (Orthopedic Implant) IMPLANT
FEMORAL TC3 PFC SIGMA SZ5 (Orthopedic Implant) IMPLANT
FILTER STRAW (MISCELLANEOUS) IMPLANT
GLOVE BIO SURGEON STRL SZ7.5 (GLOVE) ×1 IMPLANT
GLOVE BIO SURGEON STRL SZ8.5 (GLOVE) ×1 IMPLANT
GLOVE BIOGEL PI IND STRL 8 (GLOVE) ×1 IMPLANT
GLOVE BIOGEL PI IND STRL 9 (GLOVE) ×1 IMPLANT
GOWN STRL REUS W/ TWL XL LVL3 (GOWN DISPOSABLE) ×2 IMPLANT
GOWN STRL REUS W/TWL XL LVL3 (GOWN DISPOSABLE) ×2
HANDPIECE INTERPULSE COAX TIP (DISPOSABLE) ×1
HOOD PEEL AWAY T7 (MISCELLANEOUS) ×3 IMPLANT
INSERT SIGMA RP TC3 5 17.5 (Knees) IMPLANT
KIT TURNOVER KIT A (KITS) IMPLANT
NDL HYPO 21X1.5 SAFETY (NEEDLE) ×2 IMPLANT
NDL SIDE PORT 18GA QUICK PRESS (NEEDLE) IMPLANT
NEEDLE HYPO 21X1.5 SAFETY (NEEDLE) ×3 IMPLANT
NS IRRIG 1000ML POUR BTL (IV SOLUTION) ×1 IMPLANT
OSTEOTOME THIN 12 3 (MISCELLANEOUS) IMPLANT
PACK TOTAL KNEE CUSTOM (KITS) ×1 IMPLANT
PIN STEINMAN FIXATION KNEE (PIN) IMPLANT
PROTECTOR NERVE ULNAR (MISCELLANEOUS) ×1 IMPLANT
SET HNDPC FAN SPRY TIP SCT (DISPOSABLE) ×1 IMPLANT
SLEEVE TIB MBT 53 (Knees) IMPLANT
SPIKE FLUID TRANSFER (MISCELLANEOUS) ×3 IMPLANT
SPONGE T-LAP 18X18 ~~LOC~~+RFID (SPONGE) IMPLANT
STAPLER VISISTAT 35W (STAPLE) IMPLANT
STEM UNIVERSAL REVISION 75X12 (Stem) IMPLANT
SUT VIC AB 1 CTX 36 (SUTURE) ×2
SUT VIC AB 1 CTX36XBRD ANBCTR (SUTURE) ×1 IMPLANT
SUT VIC AB 3-0 CT1 27 (SUTURE) ×2
SUT VIC AB 3-0 CT1 TAPERPNT 27 (SUTURE) ×2 IMPLANT
SWAB COLLECTION DEVICE MRSA (MISCELLANEOUS) IMPLANT
SWAB CULTURE ESWAB REG 1ML (MISCELLANEOUS) IMPLANT
SYR 27GX1/2 1ML LL SAFETY (SYRINGE) IMPLANT
SYR CONTROL 10ML LL (SYRINGE) ×2 IMPLANT
TRAY FOL W/BAG SLVR 16FR STRL (SET/KITS/TRAYS/PACK) IMPLANT
TRAY FOLEY MTR SLVR 16FR STAT (SET/KITS/TRAYS/PACK) ×1 IMPLANT
TRAY FOLEY W/BAG SLVR 16FR LF (SET/KITS/TRAYS/PACK) ×1
TRAY TIB SZ 5 REVISION (Knees) IMPLANT
TUBE SUCTION HIGH CAP CLEAR NV (SUCTIONS) ×1 IMPLANT
WATER STERILE IRR 1000ML POUR (IV SOLUTION) ×2 IMPLANT
WRAP KNEE MAXI GEL POST OP (GAUZE/BANDAGES/DRESSINGS) ×1 IMPLANT

## 2022-07-17 NOTE — Transfer of Care (Signed)
Immediate Anesthesia Transfer of Care Note  Patient: [name]  Procedure(s) Performed: RIGHT TOTAL KNEE REVISION (Right: Knee)  Patient Location: PACU  Anesthesia Type:Spinal  Level of Consciousness: drowsy and responds to stimulation  Airway & Oxygen Therapy: Patient Spontanous Breathing and Patient connected to face mask oxygen  Post-op Assessment: Report given to RN and Post -op Vital signs reviewed and stable  Post vital signs: Reviewed and stable  Last Vitals:  Vitals Value Taken Time  BP 103/74 07/17/22 1128  Temp    Pulse 81 07/17/22 1130  Resp 13 07/17/22 1130  SpO2 96 % 07/17/22 1130  Vitals shown include unvalidated device data.  Last Pain:  Vitals:   07/17/22 0703  TempSrc:   PainSc: 0-No pain         Complications: No notable events documented.

## 2022-07-17 NOTE — Interval H&P Note (Signed)
History and Physical Interval Note:  07/17/2022 7:00 AM  [name]  has presented today for surgery, with the diagnosis of LOOSE RIGHT KNEE REPLACEMENT.  The various methods of treatment have been discussed with the patient and family. After consideration of risks, benefits and other options for treatment, the patient has consented to  Procedure(s): RIGHT TOTAL KNEE REVISION (Right) as a surgical intervention.  The patient's history has been reviewed, patient examined, no change in status, stable for surgery.  I have reviewed the patient's chart and labs.  Questions were answered to the patient's satisfaction.     Nestor Lewandowsky

## 2022-07-17 NOTE — Progress Notes (Signed)
Orthopedic Tech Progress Note Patient Details:  [name] 1951-03-14 [id number]  Ortho Devices Type of Ortho Device: Bone foam zero knee Ortho Device/Splint Interventions: Ordered     Bone foam dropped off at bedside in PACU. Darleen Crocker 07/17/2022, 12:01 PM

## 2022-07-17 NOTE — Discharge Instructions (Signed)

## 2022-07-17 NOTE — Anesthesia Procedure Notes (Signed)
Procedure Name: MAC Date/Time: 07/17/2022 7:21 AM  Performed by: Sindy Guadeloupe, CRNAPre-anesthesia Checklist: Patient identified, Emergency Drugs available, Suction available, Patient being monitored and Timeout performed Oxygen Delivery Method: Simple face mask Placement Confirmation: positive ETCO2

## 2022-07-17 NOTE — Anesthesia Postprocedure Evaluation (Signed)
Anesthesia Post Note  Patient: [name]  Procedure(s) Performed: RIGHT TOTAL KNEE REVISION (Right: Knee)     Patient location during evaluation: PACU Anesthesia Type: Regional Level of consciousness: awake and alert, patient cooperative and oriented Pain management: pain level controlled Vital Signs Assessment: post-procedure vital signs reviewed and stable Respiratory status: nonlabored ventilation, spontaneous breathing and respiratory function stable Cardiovascular status: blood pressure returned to baseline and stable Postop Assessment: spinal receding, no apparent nausea or vomiting and patient able to bend at knees Anesthetic complications: no   No notable events documented.  Last Vitals:  Vitals:   07/17/22 1140 07/17/22 1200  BP:    Pulse:  72  Resp:  14  Temp:    SpO2: 97% 95%    Last Pain:  Vitals:   07/17/22 1200  TempSrc:   PainSc: 7                  Jyll Tomaro,E. Anysia Choi

## 2022-07-17 NOTE — Op Note (Signed)
PATIENT ID:      [name]  MRN:     [id number] DOB/AGE:    70-09-1950 / 70 y.o.       OPERATIVE REPORT   DATE OF PROCEDURE:  07/17/2022      PREOPERATIVE DIAGNOSIS:   Painful right total knee with recurrent serosanguineous effusions, bone scan showed possible loosening of tibial and femoral implants.  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                                       PROCEDURE: Revision right total knee arthroplasty with removal of primary DePuy Sigma RP implants.  The femur was revised to a new #5 TC 3 femoral implant with a 12 mm x 75 mm stem.  On the tibial side we revised to a #5 MBT tray with a 53 mm sleeve and a 12 x 75 mm uncemented stem.  We used distal cement on the femur and proximal cement on the tibia.    SURGEON: Nestor Lewandowsky  ASSISTANT:   Tomi Likens. [employer]   (Present and scrubbed throughout the case, critical for assistance with exposure, retraction, instrumentation, and closure.)        ANESTHESIA: Spinal, 20cc Exparel, 50cc 0.25% Marcaine EBL: 500 cc FLUID REPLACEMENT: 1500 cc crystaloid TOURNIQUET: Not used DRAINS: None TRANEXAMIC ACID: 1gm IV, 2gm topical COMPLICATIONS:  None         INDICATIONS FOR PROCEDURE: Patient has had recurrent current effusions and pain in his cemented DePuy attune Sigma RP total knee arthroplasty for the last few years.  His index procedure was done in 2012.  He had revision of the patellar implant and the tibial bearing a few years ago.  He has had persistent pain with recurrent effusion since send in the last few months he has had the effusion drained twice.  Synovasure tests have been negative x 2.  Plain x-rays did show a lucency under the anterior flange of the femur and a lucent line between the box and the notch on the lateral views..  Bone scan showed possible loosening of the lateral femoral  and tibial implants.  After carefully weighing the risks and benefits he desires elective exploration and possible revision of his right total knee.  DESCRIPTION OF PROCEDURE: The patient identified by armband, received  IV antibiotics, in the holding area at [facility]. Patient taken to the operating room, appropriate anesthetic monitors were attached, and spinal anesthesia was  induced. IV Tranexamic acid was given. Lateral post and foot positioner applied to the table, the lower extremity was then prepped and draped in usual sterile fashion from the toes to the tourniquet. Time-out procedure was performed. The skin and subcutaneous tissue along the incision was injected with 20 cc of a mixture of Exparel and Marcaine solution, using a 20-gauge by 1-1/2 inch needle. We began the operation, with the knee flexed 130 degrees using a #10 blade we excised the old anterior incision starting a handbreadth above the patella and then going 6 cm distal to the tibial tubercle through the skin and subcutaneous tissue.  Small bleeders were identified and cauterized.  We then made a medial parapatellar arthrotomy and subluxed the patella laterally allowing excision of scar tissue from around the femoral implant and  patellar implants.  We elevated the medial collateral ligament off of the tibia going from proximal to distal and working around the posterior medial aspect of the tibia.  Using a 1 inch osteotome we then sectioned of the stem on the RP bearing and removed the 15 mm bearing that was in place.  This allowed further exposure of the femur and there was clearly a gap between the anterior flange and the anterior femur.  We placed a slaphammer on the femoral component and noted that could be toggled anteriorly and posterior with a slaphammer in place.  Using 1/4 inch osteotome working our way around medially and laterally we then loosen the femoral component and removed it.  In a similar fashion we remove scar  tissue from around the tibial component and using 1/4 inch osteotome it came off with a single blow with a small mallet.  We then removed the surface cement from the distal femur and the proximal tibia after removing the cement from the proximal tibia we noted a large contained lateral cyst about 2.5 cm in width a centimeter and a half and length.  Using reamers we then reamed the tibia up to 12 m to the appropriate depth for 75 mm DePuy stem.  With a 12 mm reamer in place we then performed a set minimal surface cut on the proximal tibia in preparation for the new tibial implant.  We then sized for a #5 MBT tray and pinned the guide in place.  With the smoke stack in place we then performed the conical reamer with and without the 12 mm stem.  Because of the large lateral cyst we went ahead and broached up to a 53 mm sleeve and then performed a trial placement of a #5 MBT tray through the 53 mm sleeve trial.  We then directed our attention to the distal femur which was reamed up to 12 mm the appropriate depth for a 75 mm stem.  With the reamer in place we performed a similar minimal distal femur cut of about 2 to 3 mm sized for a #5 TC 3 implant.  We then pinned to the chamfer cutting guide in place to perform the anterior anterior chamfer posterior chamfer and posterior cuts without difficulty obtaining very minimal bone.  The notch cutting guide was then pinned into place and we enhanced the notch to except a TC 3 femur.  At this point all bony surfaces of Waterpik clean.  We did drill some holes in the distal femur and proximal tibia to enhance cement interdigitation.  We injected Exparel/Marcaine mixture in the medial lateral and posterior tissues at this time using 10 cc syringes with 22 x 1-1/2 inch needles.  We assembled the tibial implant which was a #5 tray 53 sleeve in the correct rotation and a 12 mm x 75 mm stem.  A double batch of Depuy cement was then mixed applied to the bony surfaces of the tibia  filling in the defect in the lateral tibial plateau and applying cement to the proximal tibial implant and sleeve.  The implant was then hammered into place and excess cement removed.  In a similar fashion we assembled the femoral implant and #5 right femur and a 12 mm x 75 mm stem.  Another double batch of cement was then mixed applied to the bony and metallic mating surfaces and the femur was hammered into place.  While the cement cured we used a 17.5 mm MBT TC 3 bearing triial.  We then Waterpik clean the wound.  Injected the rest of the Exparel Marcaine mixture into the soft tissues anteriorly.  Small bleeders were identified and cauterized.  The knee was then hyperflexed 1 more time to remove the trial 17.5 mm bearing and replace it with the real number 5 by 17.5 mm Sigma RP high post bearing.  We once again cleansed the wound with pulse lavage.  The knee was held in 30 degrees flexion for closure and to compress the implants and new cement.  The deep tissues were then closed with #1 running Vicryl suture.  Subcutaneous and subcuticular closure was accomplished with #3 Vicryl suture.  A dressing of Aquacel was then applied followed by an Ace wrap. The patient was taken to recovery room without difficulty.   Nestor Lewandowsky 07/17/2022, 10:39 AM

## 2022-07-17 NOTE — Anesthesia Procedure Notes (Signed)
Anesthesia Regional Block: Adductor canal block   Pre-Anesthetic Checklist: , timeout performed,  Correct Patient, Correct Site, Correct Laterality,  Correct Procedure, Correct Position, site marked,  Risks and benefits discussed,  Surgical consent,  Pre-op evaluation,  At surgeon's request and post-op pain management  Laterality: Right and Lower  Prep: chloraprep       Needles:  Injection technique: Single-shot  Needle Type: Echogenic Needle     Needle Length: 9cm  Needle Gauge: 21     Additional Needles:   Procedures:,,,, ultrasound used (permanent image in chart),,    Narrative:  Start time: 07/17/2022 7:03 AM End time: 07/17/2022 7:09 AM Injection made incrementally with aspirations every 5 mL.  Performed by: Personally  Anesthesiologist: Jairo Ben, MD  Additional Notes: Pt identified in Holding room.  Monitors applied. Working IV access confirmed. Timeout, Sterile prep R thigh.  #21ga ECHOgenic Arrow block needle into adductor canal with US guidance.  20cc 0.75% Ropivacaine injected incrementally after negative test dose.  Patient asymptomatic, VSS, no heme aspirated, tolerated well.   Sandford Craze, MD

## 2022-07-17 NOTE — Evaluation (Signed)
Physical Therapy Evaluation Patient Details Name: [name] MRN: [id number] DOB: [birth year] Today's Date: 07/17/2022  History of Present Illness  72 yo male presents to therapy s/p R TKA on 07/17/2022 due to failure of conservative measures with loosening of prosthesis. Pt PMH includes but is not limited to: B CTR, B TKA, most recent R TKA revision due to quad tendon tear (04/25/2021) and L RTC repair.  Clinical Impression    [name] is a 72 y.o. male POD 0 s/p R TKA. Patient reports IND with mobility at baseline. Patient is now limited by functional impairments (see PT problem list below) and requires min guard for bed mobility and min guard and cues for transfers. Patient was able to ambulate 90 feet with RW and min guard level of assist. Patient instructed in exercise to facilitate ROM and circulation to manage edema. . Patient will benefit from continued skilled PT interventions to address impairments and progress towards PLOF. Acute PT will follow to progress mobility and stair training in preparation for safe discharge home with family support and pt reports HH services.       Recommendations for follow up therapy are one component of a multi-disciplinary discharge planning process, led by the attending physician.  Recommendations may be updated based on patient status, additional functional criteria and insurance authorization.  Follow Up Recommendations       Assistance Recommended at Discharge Intermittent Supervision/Assistance  Patient can return home with the following  A little help with walking and/or transfers;A little help with bathing/dressing/bathroom;Assistance with cooking/housework;Direct supervision/assist for financial management;Assist for transportation    Equipment Recommendations None recommended by PT (pt reports has DME in home setting)  Recommendations for Other Services       Functional Status Assessment Patient has had a recent decline in their  functional status and demonstrates the ability to make significant improvements in function in a reasonable and predictable amount of time.     Precautions / Restrictions Precautions Precautions: Knee;Fall Restrictions Weight Bearing Restrictions: No      Mobility  Bed Mobility Overal bed mobility: Needs Assistance Bed Mobility: Supine to Sit     Supine to sit: Min guard     General bed mobility comments: hob elevated and increased time    Transfers Overall transfer level: Needs assistance Equipment used: Rolling walker (2 wheels) Transfers: Sit to/from Stand Sit to Stand: Min guard           General transfer comment: will benefit from ongoing cues for safety and proper UE and AD placement    Ambulation/Gait Ambulation/Gait assistance: Min guard Gait Distance (Feet): 90 Feet Assistive device: Rolling walker (2 wheels) Gait Pattern/deviations: Step-to pattern, Trunk flexed Gait velocity: decreased        Stairs            Wheelchair Mobility    Modified Rankin (Stroke Patients Only)       Balance Overall balance assessment: Needs assistance Sitting-balance support: Feet supported Sitting balance-Leahy Scale: Good     Standing balance support: Bilateral upper extremity supported, During functional activity, Reliant on assistive device for balance (maintains static standing balance with 1 UE support) Standing balance-Leahy Scale: Poor                               Pertinent Vitals/Pain Pain Assessment Pain Assessment: 0-10 Pain Score: 6  Pain Location: R knee Pain Descriptors / Indicators: Constant, Discomfort, Operative site guarding, Tightness  Pain Intervention(s): Limited activity within patient's tolerance, Monitored during session, Premedicated before session, Ice applied    Home Living Family/patient expects to be discharged to:: Private residence Living Arrangements: Spouse/significant other Available Help at Discharge:  Family Type of Home: House Home Access: Stairs to enter Entrance Stairs-Rails: Left Entrance Stairs-Number of Steps: 7 side entry and 4 front Alternate Level Stairs-Number of Steps: 11 Home Layout: Two level Home Equipment: Agricultural consultant (2 wheels);BSC/3in1      Prior Function Prior Level of Function : Independent/Modified Independent             Mobility Comments: IND with all ADLs, self care tasks, IADLs, driving       Hand Dominance        Extremity/Trunk Assessment        Lower Extremity Assessment Lower Extremity Assessment: RLE deficits/detail RLE Deficits / Details: ankle DF/PF 5/5; SLR < 10 degree lag RLE Sensation: WNL    Cervical / Trunk Assessment Cervical / Trunk Assessment: Normal  Communication   Communication: No difficulties  Cognition Arousal/Alertness: Awake/alert Behavior During Therapy: WFL for tasks assessed/performed Overall Cognitive Status: Within Functional Limits for tasks assessed                                          General Comments      Exercises Total Joint Exercises Ankle Circles/Pumps: AROM, Both, 20 reps   Assessment/Plan    PT Assessment Patient needs continued PT services  PT Problem List Decreased range of motion;Decreased strength;Decreased activity tolerance;Decreased balance;Decreased mobility;Decreased coordination;Pain       PT Treatment Interventions DME instruction;Gait training;Stair training;Functional mobility training;Therapeutic activities;Therapeutic exercise;Balance training;Neuromuscular re-education;Patient/family education;Modalities    PT Goals (Current goals can be found in the Care Plan section)  Acute Rehab PT Goals Patient Stated Goal: to be able to kneel PT Goal Formulation: With patient Time For Goal Achievement: 07/31/22 Potential to Achieve Goals: Good    Frequency 7X/week     Co-evaluation               AM-PAC PT "6 Clicks" Mobility  Outcome Measure  Help needed turning from your back to your side while in a flat bed without using bedrails?: A Little Help needed moving from lying on your back to sitting on the side of a flat bed without using bedrails?: A Little Help needed moving to and from a bed to a chair (including a wheelchair)?: A Little Help needed standing up from a chair using your arms (e.g., wheelchair or bedside chair)?: A Little Help needed to walk in hospital room?: A Little Help needed climbing 3-5 steps with a railing? : A Lot 6 Click Score: 17    End of Session Equipment Utilized During Treatment: Gait belt Activity Tolerance: No increased pain Patient left: in chair;with call bell/phone within reach Nurse Communication: Mobility status;Patient requests pain meds PT Visit Diagnosis: Unsteadiness on feet (R26.81);Other abnormalities of gait and mobility (R26.89);Muscle weakness (generalized) (M62.81);History of falling (Z91.81);Pain Pain - Right/Left: Right Pain - part of body: Knee    Time: 4742-5956 PT Time Calculation (min) (ACUTE ONLY): 33 min   Charges:   PT Evaluation $PT Eval Low Complexity: 1 Low PT Treatments $Gait Training: 8-22 mins        Rica Mote, PT   Jacqualyn Posey 07/17/2022, 4:40 PM

## 2022-07-17 NOTE — Anesthesia Procedure Notes (Addendum)
Spinal  Patient location during procedure: OR Start time: 07/17/2022 7:28 AM Reason for block: surgical anesthesia Staffing Performed: resident/CRNA  Anesthesiologist: Jairo Ben, MD Resident/CRNA: Sindy Guadeloupe, CRNA Performed by: Sindy Guadeloupe, CRNA Authorized by: Jairo Ben, MD   Preanesthetic Checklist Completed: patient identified, IV checked, site marked, risks and benefits discussed, surgical consent, monitors and equipment checked, pre-op evaluation and timeout performed Spinal Block Patient position: sitting Prep: DuraPrep and site prepped and draped Patient monitoring: heart rate, cardiac monitor, continuous pulse ox and blood pressure Approach: midline Location: L3-4 Injection technique: single-shot Needle Needle type: Pencan  Needle gauge: 24 G Needle length: 10 cm Assessment Events: CSF return Additional Notes Pt placed in sitting position, timeout, spinal kit expiration date checked and verified, + CSF, - heme, pt tolerated well. Dr Jean Rosenthal present and supervising throughout.

## 2022-07-18 ENCOUNTER — Encounter (HOSPITAL_COMMUNITY): Payer: Self-pay | Admitting: Orthopedic Surgery

## 2022-07-18 DIAGNOSIS — G43919 Migraine, unspecified, intractable, without status migrainosus: Secondary | ICD-10-CM | POA: Insufficient documentation

## 2022-07-18 DIAGNOSIS — I10 Essential (primary) hypertension: Secondary | ICD-10-CM | POA: Insufficient documentation

## 2022-07-18 DIAGNOSIS — G4733 Obstructive sleep apnea (adult) (pediatric): Secondary | ICD-10-CM | POA: Insufficient documentation

## 2022-07-18 DIAGNOSIS — R7303 Prediabetes: Secondary | ICD-10-CM | POA: Insufficient documentation

## 2022-07-18 NOTE — Progress Notes (Signed)
Physical Therapy Treatment Patient Details Name: [name] MRN: [id number] DOB: 1950-11-30 Today's Date: 07/18/2022   History of Present Illness 72 yo male presents to therapy s/p R TKA on 07/17/2022 due to failure of conservative measures with loosening of prosthesis. Pt PMH includes but is not limited to: B CTR, B TKA, most recent R TKA revision due to quad tendon tear (04/25/2021) and L RTC repair.    PT Comments    Pt is POD # 1 and is progressing well.  He has excellent quad activation, ROM, and no pain.  Pt ambulating 150' with supervision and performed 5 steps with bil rails without difficulty.  Pt expected to progress well. Pt demonstrates safe gait & transfers in order to return home from PT perspective once discharged by MD.  While in hospital, will continue to benefit from PT for skilled therapy to advance mobility and exercises.      Recommendations for follow up therapy are one component of a multi-disciplinary discharge planning process, led by the attending physician.  Recommendations may be updated based on patient status, additional functional criteria and insurance authorization.  Follow Up Recommendations       Assistance Recommended at Discharge Intermittent Supervision/Assistance  Patient can return home with the following A little help with walking and/or transfers;A little help with bathing/dressing/bathroom;Assistance with cooking/housework;Direct supervision/assist for financial management;Assist for transportation   Equipment Recommendations  None recommended by PT    Recommendations for Other Services       Precautions / Restrictions Precautions Precautions: Knee;Fall Restrictions Weight Bearing Restrictions: Yes RLE Weight Bearing: Weight bearing as tolerated     Mobility  Bed Mobility               General bed mobility comments: in chair at arrival    Transfers Overall transfer level: Needs assistance Equipment used: Rolling walker (2  wheels) Transfers: Sit to/from Stand Sit to Stand: Supervision           General transfer comment: Performed with good hand placement and without difficulty    Ambulation/Gait Ambulation/Gait assistance: Supervision Gait Distance (Feet): 150 Feet Assistive device: Rolling walker (2 wheels) Gait Pattern/deviations: Step-through pattern Gait velocity: decreased     General Gait Details: Smooth gait pattern wtih only slight decrease in weight shift to R   Stairs Stairs: Yes Stairs assistance: Min guard Stair Management: Two rails, Step to pattern, Forwards Number of Stairs: 5 General stair comments: Performed 5 steps wtihout difficulty using "up with good and down with bad" without cues   Wheelchair Mobility    Modified Rankin (Stroke Patients Only)       Balance Overall balance assessment: Needs assistance Sitting-balance support: Feet supported Sitting balance-Leahy Scale: Good     Standing balance support: Bilateral upper extremity supported, During functional activity, Reliant on assistive device for balance, No upper extremity supported Standing balance-Leahy Scale: Fair Standing balance comment: RW to ambulate but able to stand witout support                            Cognition Arousal/Alertness: Awake/alert Behavior During Therapy: WFL for tasks assessed/performed Overall Cognitive Status: Within Functional Limits for tasks assessed                                          Exercises Total Joint Exercises Ankle Circles/Pumps: AROM, Both,  10 reps, Supine Quad Sets: AROM, Both, 10 reps, Supine Heel Slides: AROM, Right, 5 reps, Supine Hip ABduction/ADduction: AROM, Right, 5 reps, Supine Long Arc Quad: AROM, Right, 5 reps, Seated Knee Flexion: AROM, Right, 5 reps, Seated Goniometric ROM: R knee 0 to 90    General Comments   Educated on safe ice use, no pivots, car transfers, resting with leg straight, and TED hose during  day. Also, encouraged walking every 1-2 hours during day. Educated on HEP with focus on mobility the first weeks. Discussed doing exercises within pain control and if pain increasing could decreased ROM, reps, and stop exercises as needed. Encouraged to perform quad sets and ankle pumps frequently for blood flow and to promote full knee extension.      Pertinent Vitals/Pain Pain Assessment Pain Assessment: No/denies pain    Home Living                          Prior Function            PT Goals (current goals can now be found in the care plan section) Progress towards PT goals: Progressing toward goals    Frequency    7X/week      PT Plan Current plan remains appropriate    Co-evaluation              AM-PAC PT "6 Clicks" Mobility   Outcome Measure  Help needed turning from your back to your side while in a flat bed without using bedrails?: None Help needed moving from lying on your back to sitting on the side of a flat bed without using bedrails?: None Help needed moving to and from a bed to a chair (including a wheelchair)?: A Little Help needed standing up from a chair using your arms (e.g., wheelchair or bedside chair)?: A Little Help needed to walk in hospital room?: A Little Help needed climbing 3-5 steps with a railing? : A Little 6 Click Score: 20    End of Session Equipment Utilized During Treatment: Gait belt Activity Tolerance: Patient tolerated treatment well Patient left: in chair;with call bell/phone within reach Nurse Communication: Mobility status PT Visit Diagnosis: Unsteadiness on feet (R26.81);Other abnormalities of gait and mobility (R26.89);Muscle weakness (generalized) (M62.81);History of falling (Z91.81);Pain Pain - Right/Left: Right     Time: 1011-1030 PT Time Calculation (min) (ACUTE ONLY): 19 min  Charges:  $Gait Training: 8-22 mins                     Anise Salvo, PT Acute Rehab [facility]  [phone]    Rayetta Humphrey 07/18/2022, 12:05 PM

## 2022-07-18 NOTE — TOC Transition Note (Signed)
Transition of Care [facility]) - CM/SW Discharge Note  Patient Details  Name: [name] MRN: [id number] Date of Birth: September 11, 1950  [facility]) CM/SW Contact:  Ewing Schlein, LCSW Phone Number: 07/18/2022, 10:59 AM  Clinical Narrative: Patient is expected to discharge home after working with PT. CSW met with patient to review discharge plan. Patient will go home with OPPT and confirmed he has a rolling walker at home, so there are no DME needs at this time. TOC signing off.    Final next level of care: OP Rehab Barriers to Discharge: No Barriers Identified  Patient Goals and CMS Choice Choice offered to / list presented to : NA  Discharge Plan and Services Additional resources added to the After Visit Summary for         DME Arranged: N/A DME Agency: NA  Social Determinants of Health (SDOH) Interventions SDOH Screenings   Food Insecurity: No Food Insecurity (07/17/2022)  Housing: Low Risk  (07/17/2022)  Transportation Needs: No Transportation Needs (07/17/2022)  Utilities: Not At Risk (07/17/2022)  Tobacco Use: Unknown (07/18/2022)   Readmission Risk Interventions    04/26/2021    2:13 PM  Readmission Risk Prevention Plan  Post Dischage Appt Complete  Medication Screening Complete  Transportation Screening Complete

## 2022-07-18 NOTE — Plan of Care (Signed)
Plan of care reviewed and discussed. Problem: Education: Goal: Knowledge of the prescribed therapeutic regimen will improve Outcome: Progressing Goal: Individualized Educational Video(s) Outcome: Completed/Met   Problem: Activity: Goal: Ability to avoid complications of mobility impairment will improve Outcome: Adequate for Discharge Goal: Range of joint motion will improve Outcome: Adequate for Discharge   Problem: Clinical Measurements: Goal: Postoperative complications will be avoided or minimized Outcome: Progressing   Problem: Pain Management: Goal: Pain level will decrease with appropriate interventions Outcome: Progressing   Problem: Skin Integrity: Goal: Will show signs of wound healing Outcome: Progressing

## 2022-07-18 NOTE — Progress Notes (Signed)
PATIENT ID: [name]  MRN: [id number]  DOB/AGE:  May 30, 1950 / 72 y.o.  1 Day Post-Op Procedure(s) (LRB): RIGHT TOTAL KNEE REVISION (Right)    PROGRESS NOTE Subjective: Patient is alert, oriented, no Nausea, no Vomiting, yes passing gas. Taking PO well. Denies SOB, Chest or Calf Pain. Using Incentive Spirometer, PAS in place. Ambulate 90', Patient reports pain as 2/10 .    Objective: Vital signs in last 24 hours: Vitals:   07/17/22 1710 07/17/22 2050 07/18/22 0101 07/18/22 0546  BP: 129/80 132/79 126/82 118/61  Pulse: 75 79 83 78  Resp: Temp: 97.8 F (36.6 C) 97.9 F (36.6 C) 97.9 F (36.6 C) 97.9 F (36.6 C)  TempSrc: Oral  Oral   SpO2: 94% 97% 98% 94%  Weight:      Height:          Intake/Output from previous day: I/O last 3 completed shifts: In: 3712 [P.O.:240; I.V.:3217; IV Piggyback:255] Out: 650 [Urine:400; Blood:250]   Intake/Output this shift: No intake/output data recorded.   LABORATORY DATA: No results for input(s): "WBC", "HGB", "HCT", "PLT", "NA", "K", "CL", "CO2", "BUN", "CREATININE", "GLUCOSE", "GLUCAP", "INR", "CALCIUM" in the last 72 hours.  Invalid input(s): "PT", "2"  Examination: Neurologically intact ABD soft Neurovascular intact Sensation intact distally Intact pulses distally Dorsiflexion/Plantar flexion intact Incision: scant drainage No cellulitis present Compartment soft}  Assessment:   1 Day Post-Op Procedure(s) (LRB): RIGHT TOTAL KNEE REVISION (Right) ADDITIONAL DIAGNOSIS: Expected Acute Blood Loss Anemia, Hypertension, morbid obesity, prediabetic,Sleep apnea Anticipated LOS equal to or greater than 2 midnights due to - Age 70 and older with one or more of the following:  - Obesity  - Expected need for hospital services (PT, OT, Nursing) required for safe  discharge  - Anticipated need for postoperative skilled nursing care or inpatient rehab OR   Patient has inpatient only CPT code  Plan: PT/OT WBAT, AROM  and PROM  DVT Prophylaxis:  SCDx72hrs, ASA 81 mg BID x 2 weeks DISCHARGE PLAN: Home, Today if he passes physical therapy. DISCHARGE NEEDS: HHPT, Walker, and 3-in-1 comode seat     Nestor Lewandowsky 07/18/2022, 7:54 AM Patient ID: [name], male   DOB: 1950/07/29, 72 y.o.   MRN: [id number]

## 2022-07-18 NOTE — Discharge Summary (Signed)
Patient ID: [name] MRN: [id number] DOB/AGE: 11-20-1950 72 y.o.  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 male who was admitted 07/17/2022 for operative treatment ofS/P revision of total knee, right. Patient has severe unremitting pain that affects sleep, daily activities, and work/hobbies. After pre-op clearance the patient was taken to the operating room on 07/17/2022 and underwent  Procedure(s): RIGHT TOTAL KNEE REVISION.    Patient was given perioperative antibiotics:  Anti-infectives (From admission, onward)    Start     Dose/Rate Route Frequency Ordered Stop   07/17/22 0700  ceFAZolin (ANCEF) IVPB 3g/100 mL premix        3 g 200 mL/hr over 30 Minutes Intravenous  Once 07/17/22 0651 07/17/22 0740   07/17/22 0651  ceFAZolin (ANCEF) 3-0.9 GM/100ML-% IVPB       Note to Pharmacy: [facility] B: cabinet override      07/17/22 0651 07/17/22 0737   07/17/22 0600  vancomycin (VANCOREADY) IVPB 1500 mg/300 mL  Status:  Discontinued        1,500 mg 150 mL/hr over 120 Minutes Intravenous On call to O.R. 07/16/22 4098 07/16/22 1191   07/17/22 0600  vancomycin (VANCOREADY) IVPB 1500 mg/300 mL  Status:  Discontinued        1,500 mg 150 mL/hr over 120 Minutes Intravenous On call to O.R. 07/17/22 0532 07/17/22 1244        Patient was given sequential compression devices, early ambulation, and chemoprophylaxis to prevent DVT.  Patient benefited maximally from hospital stay and there were no complications.    Recent vital signs: Patient Vitals for  the past 24 hrs:  BP Temp Temp src Pulse Resp SpO2  07/18/22 0931 117/62 97.8 F (36.6 C) -- 70 18 97 %  07/18/22 0546 118/61 97.9 F (36.6 C) -- 78 18 94 %  07/18/22 0101 126/82 97.9 F (36.6 C) Oral 83 18 98 %  07/17/22 2050 132/79 97.9 F (36.6 C) -- 79 17 97 %  07/17/22 1710 129/80 97.8 F (36.6 C) Oral 75 18 94 %  07/17/22 1240 121/72 97.8 F (36.6 C) Oral 67 -- 100 %  07/17/22 1230 114/66 -- -- 71 (!) 22 98 %  07/17/22 1215 114/65 97.8 F (36.6 C) -- 67 17 95 %  07/17/22 1200 106/71 -- -- 72 14 95 %  07/17/22 1145 120/68 -- -- 76 12 97 %  07/17/22 1140 -- -- -- -- -- 97 %     Recent laboratory studies: No results for input(s): "WBC", "HGB", "HCT", "PLT", "NA", "K", "CL", "CO2", "BUN", "CREATININE", "GLUCOSE", "INR", "CALCIUM" in the last 72 hours.  Invalid input(s): "PT", "2"   Discharge Medications:   Allergies as of 07/18/2022       Reactions   Lisinopril Swelling   Penicillins Hives   RXN was in the 1970's   Amlodipine Swelling, Other (See Comments)        Medication List     STOP taking these medications    GOODY HEADACHE PO   ondansetron 4 MG disintegrating tablet Commonly known as: ZOFRAN-ODT  TAKE these medications    aspirin EC 81 MG tablet Take 1 tablet (81 mg total) by mouth 2 (two) times daily.   carvedilol 25 MG tablet Commonly known as: COREG Take 25 mg by mouth 2 (two) times daily with a meal.   chlorthalidone 25 MG tablet Commonly known as: HYGROTON Take 25 mg by mouth daily.   finasteride 5 MG tablet Commonly known as: PROSCAR Take 5 mg by mouth daily.   HYDROmorphone 2 MG tablet Commonly known as: Dilaudid Take 1 tablet (2 mg total) by mouth every 4 (four) hours as needed for severe pain.   losartan 100 MG tablet Commonly known as: COZAAR Take 100 mg by mouth daily.   magnesium oxide 400 (240 Mg) MG tablet Commonly known as: MAG-OX Take 400 mg by mouth daily.   sodium chloride 0.65 % Soln nasal spray Commonly  known as: OCEAN Place 1 spray into both nostrils as needed for congestion.   spironolactone 25 MG tablet Commonly known as: ALDACTONE Take 25 mg by mouth daily.   tiZANidine 2 MG tablet Commonly known as: ZANAFLEX Take 1 tablet (2 mg total) by mouth every 6 (six) hours as needed for muscle spasms. What changed: when to take this   traZODone 100 MG tablet Commonly known as: DESYREL Take 100 mg by mouth at bedtime.               Durable Medical Equipment  (From admission, onward)           Start     Ordered   07/17/22 1249  DME Walker rolling  Once       Question:  Patient needs a walker to treat with the following condition  Answer:  Status post right knee replacement   07/17/22 1248   07/17/22 1249  DME 3 n 1  Once        07/17/22 1248              Discharge Care Instructions  (From admission, onward)           Start     Ordered   07/18/22 0000  Weight bearing as tolerated        07/18/22 1133            Diagnostic Studies: DG Knee Right Port  Result Date: 07/17/2022 CLINICAL DATA:  Status post revision right knee arthroplasty. EXAM: PORTABLE RIGHT KNEE - 1-2 VIEW COMPARISON:  None Available. FINDINGS: Revision right knee arthroplasty in expected alignment. No periprosthetic lucency or fracture. There has been patellar resurfacing. Recent postsurgical change includes air and edema in the soft tissues and joint space. IMPRESSION: Revision right knee arthroplasty without immediate postoperative complication. Electronically Signed   By: Narda Rutherford M.D.   On: 07/17/2022 12:25   DG Chest 2 View  Result Date: 07/15/2022 CLINICAL DATA:  [id number] Pre-op exam [id number] EXAM: CHEST - 2 VIEW COMPARISON:  04/19/2021 FINDINGS: Lungs are clear. Heart size and mediastinal contours are within normal limits. No effusion. Visualized bones unremarkable. IMPRESSION: No acute cardiopulmonary disease. Electronically Signed   By: Corlis Leak M.D.   On: 07/15/2022 09:14     Disposition: Discharge disposition: 01-Home or Self Care       Discharge Instructions     Call MD / Call 911   Complete by: As directed    If you experience chest pain or shortness of breath, CALL 911 and be transported to the hospital emergency room.  If you develope a fever  above 101 F, pus (white drainage) or increased drainage or redness at the wound, or calf pain, call your surgeon's office.   Constipation Prevention   Complete by: As directed    Drink plenty of fluids.  Prune juice may be helpful.  You may use a stool softener, such as Colace (over the counter) 100 mg twice a day.  Use MiraLax (over the counter) for constipation as needed.   Diet - low sodium heart healthy   Complete by: As directed    Driving restrictions   Complete by: As directed    No driving for 2 weeks   Increase activity slowly as tolerated   Complete by: As directed    Patient may shower   Complete by: As directed    You may shower without a dressing once there is no drainage.  Do not wash over the wound.  If drainage remains, cover wound with plastic wrap and then shower.   Post-operative opioid taper instructions:   Complete by: As directed    POST-OPERATIVE OPIOID TAPER INSTRUCTIONS: It is important to wean off of your opioid medication as soon as possible. If you do not need pain medication after your surgery it is ok to stop day one. Opioids include: Codeine, Hydrocodone(Norco, Vicodin), Oxycodone(Percocet, oxycontin) and hydromorphone amongst others.  Long term and even short term use of opiods can cause: Increased pain response Dependence Constipation Depression Respiratory depression And more.  Withdrawal symptoms can include Flu like symptoms Nausea, vomiting And more Techniques to manage these symptoms Hydrate well Eat regular healthy meals Stay active Use relaxation techniques(deep breathing, meditating, yoga) Do Not substitute Alcohol to help with tapering If you have been  on opioids for less than two weeks and do not have pain than it is ok to stop all together.  Plan to wean off of opioids This plan should start within one week post op of your joint replacement. Maintain the same interval or time between taking each dose and first decrease the dose.  Cut the total daily intake of opioids by one tablet each day Next start to increase the time between doses. The last dose that should be eliminated is the evening dose.      Weight bearing as tolerated   Complete by: As directed         Follow-up Information     Gean Birchwood, MD Follow up in 2 week(s).   Specialty: Orthopedic Surgery Contact information: [address] [place] [address] [phone]                  Signed: Dannielle Burn 07/18/2022, 11:34 AM

## 2022-07-27 DIAGNOSIS — Z96651 Presence of right artificial knee joint: Secondary | ICD-10-CM | POA: Diagnosis not present

## 2022-09-12 DIAGNOSIS — J342 Deviated nasal septum: Secondary | ICD-10-CM | POA: Diagnosis not present

## 2022-10-03 DIAGNOSIS — M6281 Muscle weakness (generalized): Secondary | ICD-10-CM | POA: Diagnosis not present

## 2022-10-03 DIAGNOSIS — M25661 Stiffness of right knee, not elsewhere classified: Secondary | ICD-10-CM | POA: Diagnosis not present

## 2022-10-03 DIAGNOSIS — Z96651 Presence of right artificial knee joint: Secondary | ICD-10-CM | POA: Diagnosis not present

## 2022-11-20 DIAGNOSIS — M9902 Segmental and somatic dysfunction of thoracic region: Secondary | ICD-10-CM | POA: Diagnosis not present

## 2022-11-20 DIAGNOSIS — M9904 Segmental and somatic dysfunction of sacral region: Secondary | ICD-10-CM | POA: Diagnosis not present

## 2022-11-20 DIAGNOSIS — M5136 Other intervertebral disc degeneration, lumbar region: Secondary | ICD-10-CM | POA: Diagnosis not present

## 2022-11-20 DIAGNOSIS — M9903 Segmental and somatic dysfunction of lumbar region: Secondary | ICD-10-CM | POA: Diagnosis not present

## 2022-11-20 DIAGNOSIS — M5137 Other intervertebral disc degeneration, lumbosacral region: Secondary | ICD-10-CM | POA: Diagnosis not present

## 2022-11-20 DIAGNOSIS — M5135 Other intervertebral disc degeneration, thoracolumbar region: Secondary | ICD-10-CM | POA: Diagnosis not present

## 2022-11-23 DIAGNOSIS — M9903 Segmental and somatic dysfunction of lumbar region: Secondary | ICD-10-CM | POA: Diagnosis not present

## 2022-11-23 DIAGNOSIS — M9904 Segmental and somatic dysfunction of sacral region: Secondary | ICD-10-CM | POA: Diagnosis not present

## 2022-11-23 DIAGNOSIS — M9902 Segmental and somatic dysfunction of thoracic region: Secondary | ICD-10-CM | POA: Diagnosis not present

## 2022-11-23 DIAGNOSIS — M5137 Other intervertebral disc degeneration, lumbosacral region: Secondary | ICD-10-CM | POA: Diagnosis not present

## 2022-11-23 DIAGNOSIS — M5136 Other intervertebral disc degeneration, lumbar region: Secondary | ICD-10-CM | POA: Diagnosis not present

## 2022-11-23 DIAGNOSIS — M5135 Other intervertebral disc degeneration, thoracolumbar region: Secondary | ICD-10-CM | POA: Diagnosis not present

## 2022-11-28 IMAGING — NM NM BONE 3 PHASE
8 series · 18 of 18 positions shown · non-contrast
Comparison: None.

CLINICAL DATA: Bilateral total knee arthroplasty 11 years prior.
Now with right knee pain x6 months.

EXAM:
NUCLEAR MEDICINE 3-PHASE BONE SCAN
TECHNIQUE: Radionuclide angiographic images, immediate static blood pool
images, and 3-hour delayed static images were obtained of the knees
bilaterally after intravenous injection of radiopharmaceutical.
RADIOPHARMACEUTICALS:  21.9 mCi Kc-55m MDP IV

[Series 1: flow · 2.07mm/px · 6 of 48 frames shown (1 of 2)]
[frame 5/48]
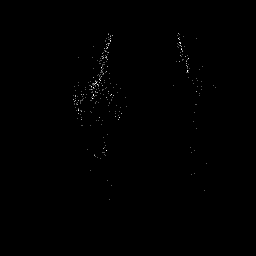
[frame 13/48  full-range]
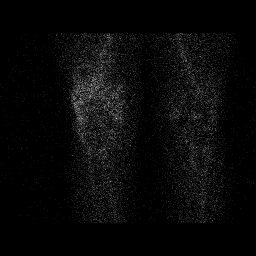
[frame 21/48  full-range]
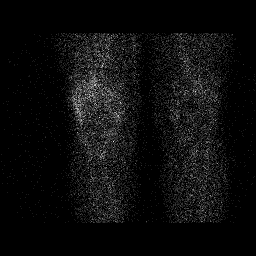
[frame 29/48  full-range]
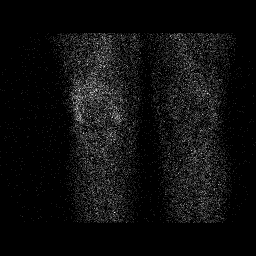
[frame 37/48  full-range]
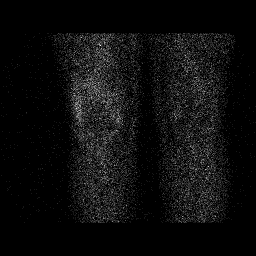
[frame 45/48  full-range]
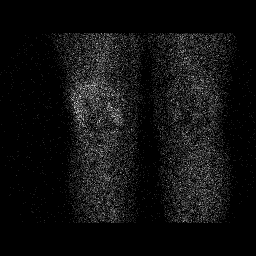

[Series 1: delay · delayed · 2.07mm/px · 1 of 1 slices shown (1 of 2)]
[im 1/1  full-range]
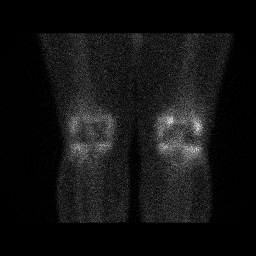

[Series 1: flow · 2.07mm/px · 6 of 48 frames shown (2 of 2)]
[frame 5/48]
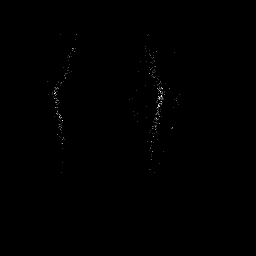
[frame 13/48  full-range]
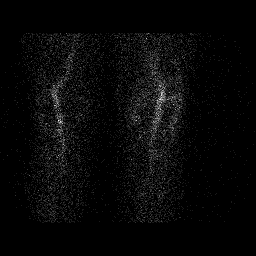
[frame 21/48  full-range]
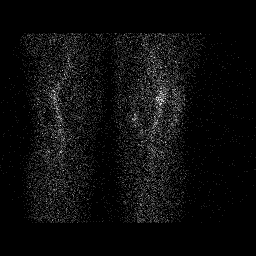
[frame 29/48  full-range]
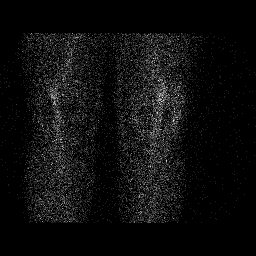
[frame 37/48  full-range]
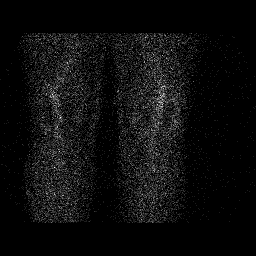
[frame 45/48  full-range]
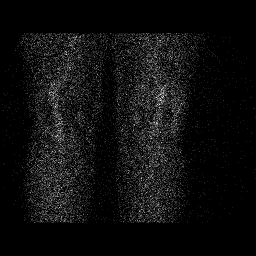

[Series 2: delay · delayed · 2.07mm/px · 1 of 1 slices shown (2 of 2)]
[im 1/1]
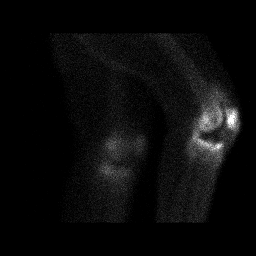

[Series 2: blood pool · 2.07mm/px · 1 of 1 slices shown (1 of 2)]
[im 1/1  full-range]
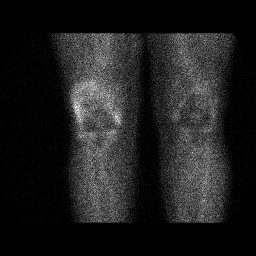

[Series 2: blood pool · 2.07mm/px · 1 of 1 slices shown (2 of 2)]
[im 1/1  full-range]
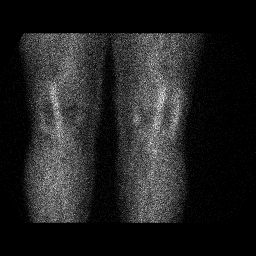

[Series 3: lat bp · 2.07mm/px · 1 of 1 slices shown (1 of 2)]
[im 1/1  full-range]
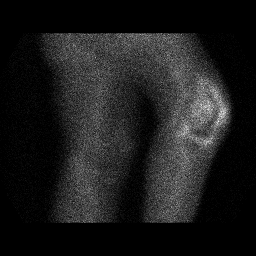

[Series 3: lat bp · 2.07mm/px · 1 of 1 slices shown (2 of 2)]
[im 1/1  full-range]
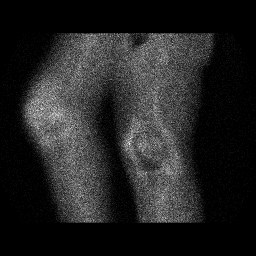

[18 of 18 positions shown; findings below may reference images not displayed]

FINDINGS: Vascular phase: There is increased relative perfusion of the right
knee.

Blood pool phase: There is relative hyperemia of the right knee with
particular focal hyperemia within the suprapatellar recess and
surrounding the distal femur.

Delayed phase: There is relatively increased deposition of
radiotracer surrounding the right knee with particular deposition of
tracer within the periarticular soft tissues of the suprapatellar
recess, best appreciated on lateral examination, as well as
asymmetrically involving the periprosthetic distal femur. Increased
uptake within the right periprosthetic distal tibia may simply be
related to relatively increased perfusion of the right knee.
IMPRESSION: Increased perfusion, hyperemia, and delayed deposition of
radiotracer with notable deposition of radiotracer within the
periarticular soft tissues and asymmetrically involving the
periprosthetic distal femur. This can be seen in setting of
cellulitis, prosthetic loosening with superimposed synovitis, or
septic arthritis/prosthetic infection. Arthrocentesis may be helpful
for further evaluation.

## 2022-12-05 DIAGNOSIS — M9902 Segmental and somatic dysfunction of thoracic region: Secondary | ICD-10-CM | POA: Diagnosis not present

## 2022-12-05 DIAGNOSIS — M9904 Segmental and somatic dysfunction of sacral region: Secondary | ICD-10-CM | POA: Diagnosis not present

## 2022-12-05 DIAGNOSIS — M545 Low back pain, unspecified: Secondary | ICD-10-CM | POA: Diagnosis not present

## 2022-12-05 DIAGNOSIS — M9903 Segmental and somatic dysfunction of lumbar region: Secondary | ICD-10-CM | POA: Diagnosis not present

## 2022-12-13 DIAGNOSIS — M9903 Segmental and somatic dysfunction of lumbar region: Secondary | ICD-10-CM | POA: Diagnosis not present

## 2022-12-13 DIAGNOSIS — M9904 Segmental and somatic dysfunction of sacral region: Secondary | ICD-10-CM | POA: Diagnosis not present

## 2022-12-13 DIAGNOSIS — M9902 Segmental and somatic dysfunction of thoracic region: Secondary | ICD-10-CM | POA: Diagnosis not present

## 2022-12-13 DIAGNOSIS — M545 Low back pain, unspecified: Secondary | ICD-10-CM | POA: Diagnosis not present

## 2022-12-14 DIAGNOSIS — M25511 Pain in right shoulder: Secondary | ICD-10-CM | POA: Diagnosis not present

## 2022-12-19 ENCOUNTER — Other Ambulatory Visit: Payer: Self-pay | Admitting: Orthopedic Surgery

## 2022-12-19 DIAGNOSIS — M25511 Pain in right shoulder: Secondary | ICD-10-CM

## 2022-12-23 ENCOUNTER — Ambulatory Visit
Admission: RE | Admit: 2022-12-23 | Discharge: 2022-12-23 | Disposition: A | Discharge status: Home / Self Care | Payer: Medicare Other | Source: Ambulatory Visit | Attending: Orthopedic Surgery | Admitting: Orthopedic Surgery

## 2022-12-23 DIAGNOSIS — M25511 Pain in right shoulder: Secondary | ICD-10-CM

## 2022-12-25 ENCOUNTER — Ambulatory Visit
Admission: RE | Admit: 2022-12-25 | Discharge: 2022-12-25 | Disposition: A | Discharge status: Home / Self Care | Payer: Medicare Other | Source: Ambulatory Visit | Attending: Orthopedic Surgery | Admitting: Orthopedic Surgery

## 2022-12-25 DIAGNOSIS — M7581 Other shoulder lesions, right shoulder: Secondary | ICD-10-CM | POA: Diagnosis not present

## 2022-12-25 DIAGNOSIS — M7551 Bursitis of right shoulder: Secondary | ICD-10-CM | POA: Diagnosis not present

## 2022-12-25 DIAGNOSIS — M25511 Pain in right shoulder: Secondary | ICD-10-CM | POA: Diagnosis not present

## 2023-01-04 DIAGNOSIS — M25511 Pain in right shoulder: Secondary | ICD-10-CM | POA: Diagnosis not present

## 2023-01-04 DIAGNOSIS — M75111 Incomplete rotator cuff tear or rupture of right shoulder, not specified as traumatic: Secondary | ICD-10-CM | POA: Diagnosis not present

## 2023-01-10 DIAGNOSIS — M25561 Pain in right knee: Secondary | ICD-10-CM | POA: Diagnosis not present

## 2023-03-13 DIAGNOSIS — M75121 Complete rotator cuff tear or rupture of right shoulder, not specified as traumatic: Secondary | ICD-10-CM | POA: Diagnosis not present

## 2023-03-13 DIAGNOSIS — M25561 Pain in right knee: Secondary | ICD-10-CM | POA: Diagnosis not present

## 2023-03-29 DIAGNOSIS — M9904 Segmental and somatic dysfunction of sacral region: Secondary | ICD-10-CM | POA: Diagnosis not present

## 2023-03-29 DIAGNOSIS — M4726 Other spondylosis with radiculopathy, lumbar region: Secondary | ICD-10-CM | POA: Diagnosis not present

## 2023-03-29 DIAGNOSIS — M47815 Spondylosis without myelopathy or radiculopathy, thoracolumbar region: Secondary | ICD-10-CM | POA: Diagnosis not present

## 2023-03-29 DIAGNOSIS — M4721 Other spondylosis with radiculopathy, occipito-atlanto-axial region: Secondary | ICD-10-CM | POA: Diagnosis not present

## 2023-03-29 DIAGNOSIS — M9901 Segmental and somatic dysfunction of cervical region: Secondary | ICD-10-CM | POA: Diagnosis not present

## 2023-03-29 DIAGNOSIS — M4728 Other spondylosis with radiculopathy, sacral and sacrococcygeal region: Secondary | ICD-10-CM | POA: Diagnosis not present

## 2023-03-29 DIAGNOSIS — M9903 Segmental and somatic dysfunction of lumbar region: Secondary | ICD-10-CM | POA: Diagnosis not present

## 2023-03-29 DIAGNOSIS — M9902 Segmental and somatic dysfunction of thoracic region: Secondary | ICD-10-CM | POA: Diagnosis not present

## 2023-04-11 DIAGNOSIS — S43431A Superior glenoid labrum lesion of right shoulder, initial encounter: Secondary | ICD-10-CM | POA: Diagnosis not present

## 2023-04-11 DIAGNOSIS — M7541 Impingement syndrome of right shoulder: Secondary | ICD-10-CM | POA: Diagnosis not present

## 2023-04-11 DIAGNOSIS — M19011 Primary osteoarthritis, right shoulder: Secondary | ICD-10-CM | POA: Diagnosis not present

## 2023-04-11 DIAGNOSIS — S46011A Strain of muscle(s) and tendon(s) of the rotator cuff of right shoulder, initial encounter: Secondary | ICD-10-CM | POA: Diagnosis not present

## 2023-04-11 DIAGNOSIS — G8918 Other acute postprocedural pain: Secondary | ICD-10-CM | POA: Diagnosis not present

## 2023-04-11 DIAGNOSIS — M7521 Bicipital tendinitis, right shoulder: Secondary | ICD-10-CM | POA: Diagnosis not present

## 2023-04-17 DIAGNOSIS — M25511 Pain in right shoulder: Secondary | ICD-10-CM | POA: Diagnosis not present

## 2023-04-17 DIAGNOSIS — M25611 Stiffness of right shoulder, not elsewhere classified: Secondary | ICD-10-CM | POA: Diagnosis not present

## 2023-04-17 DIAGNOSIS — R531 Weakness: Secondary | ICD-10-CM | POA: Diagnosis not present

## 2023-04-17 DIAGNOSIS — M75121 Complete rotator cuff tear or rupture of right shoulder, not specified as traumatic: Secondary | ICD-10-CM | POA: Diagnosis not present

## 2023-04-19 DIAGNOSIS — M75121 Complete rotator cuff tear or rupture of right shoulder, not specified as traumatic: Secondary | ICD-10-CM | POA: Diagnosis not present

## 2023-04-19 DIAGNOSIS — M25611 Stiffness of right shoulder, not elsewhere classified: Secondary | ICD-10-CM | POA: Diagnosis not present

## 2023-04-19 DIAGNOSIS — M25511 Pain in right shoulder: Secondary | ICD-10-CM | POA: Diagnosis not present

## 2023-04-19 DIAGNOSIS — R531 Weakness: Secondary | ICD-10-CM | POA: Diagnosis not present

## 2023-04-24 DIAGNOSIS — M75121 Complete rotator cuff tear or rupture of right shoulder, not specified as traumatic: Secondary | ICD-10-CM | POA: Diagnosis not present

## 2023-04-24 DIAGNOSIS — M25611 Stiffness of right shoulder, not elsewhere classified: Secondary | ICD-10-CM | POA: Diagnosis not present

## 2023-04-24 DIAGNOSIS — M25511 Pain in right shoulder: Secondary | ICD-10-CM | POA: Diagnosis not present

## 2023-04-24 DIAGNOSIS — R531 Weakness: Secondary | ICD-10-CM | POA: Diagnosis not present

## 2023-04-27 DIAGNOSIS — M75121 Complete rotator cuff tear or rupture of right shoulder, not specified as traumatic: Secondary | ICD-10-CM | POA: Diagnosis not present

## 2023-04-27 DIAGNOSIS — M25511 Pain in right shoulder: Secondary | ICD-10-CM | POA: Diagnosis not present

## 2023-04-27 DIAGNOSIS — R531 Weakness: Secondary | ICD-10-CM | POA: Diagnosis not present

## 2023-04-27 DIAGNOSIS — M25611 Stiffness of right shoulder, not elsewhere classified: Secondary | ICD-10-CM | POA: Diagnosis not present

## 2023-05-01 DIAGNOSIS — M25611 Stiffness of right shoulder, not elsewhere classified: Secondary | ICD-10-CM | POA: Diagnosis not present

## 2023-05-01 DIAGNOSIS — M75121 Complete rotator cuff tear or rupture of right shoulder, not specified as traumatic: Secondary | ICD-10-CM | POA: Diagnosis not present

## 2023-05-01 DIAGNOSIS — R531 Weakness: Secondary | ICD-10-CM | POA: Diagnosis not present

## 2023-05-01 DIAGNOSIS — M25511 Pain in right shoulder: Secondary | ICD-10-CM | POA: Diagnosis not present

## 2023-05-04 DIAGNOSIS — M75121 Complete rotator cuff tear or rupture of right shoulder, not specified as traumatic: Secondary | ICD-10-CM | POA: Diagnosis not present

## 2023-05-04 DIAGNOSIS — R531 Weakness: Secondary | ICD-10-CM | POA: Diagnosis not present

## 2023-05-04 DIAGNOSIS — M25511 Pain in right shoulder: Secondary | ICD-10-CM | POA: Diagnosis not present

## 2023-05-04 DIAGNOSIS — M25611 Stiffness of right shoulder, not elsewhere classified: Secondary | ICD-10-CM | POA: Diagnosis not present

## 2023-05-08 DIAGNOSIS — R531 Weakness: Secondary | ICD-10-CM | POA: Diagnosis not present

## 2023-05-08 DIAGNOSIS — M25511 Pain in right shoulder: Secondary | ICD-10-CM | POA: Diagnosis not present

## 2023-05-08 DIAGNOSIS — M75121 Complete rotator cuff tear or rupture of right shoulder, not specified as traumatic: Secondary | ICD-10-CM | POA: Diagnosis not present

## 2023-05-08 DIAGNOSIS — M25611 Stiffness of right shoulder, not elsewhere classified: Secondary | ICD-10-CM | POA: Diagnosis not present

## 2023-05-11 DIAGNOSIS — M25611 Stiffness of right shoulder, not elsewhere classified: Secondary | ICD-10-CM | POA: Diagnosis not present

## 2023-05-11 DIAGNOSIS — R531 Weakness: Secondary | ICD-10-CM | POA: Diagnosis not present

## 2023-05-11 DIAGNOSIS — M75121 Complete rotator cuff tear or rupture of right shoulder, not specified as traumatic: Secondary | ICD-10-CM | POA: Diagnosis not present

## 2023-05-11 DIAGNOSIS — M25511 Pain in right shoulder: Secondary | ICD-10-CM | POA: Diagnosis not present

## 2023-05-15 DIAGNOSIS — M25611 Stiffness of right shoulder, not elsewhere classified: Secondary | ICD-10-CM | POA: Diagnosis not present

## 2023-05-15 DIAGNOSIS — M75121 Complete rotator cuff tear or rupture of right shoulder, not specified as traumatic: Secondary | ICD-10-CM | POA: Diagnosis not present

## 2023-05-15 DIAGNOSIS — M25511 Pain in right shoulder: Secondary | ICD-10-CM | POA: Diagnosis not present

## 2023-05-15 DIAGNOSIS — R531 Weakness: Secondary | ICD-10-CM | POA: Diagnosis not present

## 2023-05-18 DIAGNOSIS — M75121 Complete rotator cuff tear or rupture of right shoulder, not specified as traumatic: Secondary | ICD-10-CM | POA: Diagnosis not present

## 2023-05-18 DIAGNOSIS — R531 Weakness: Secondary | ICD-10-CM | POA: Diagnosis not present

## 2023-05-18 DIAGNOSIS — M25611 Stiffness of right shoulder, not elsewhere classified: Secondary | ICD-10-CM | POA: Diagnosis not present

## 2023-05-18 DIAGNOSIS — M25511 Pain in right shoulder: Secondary | ICD-10-CM | POA: Diagnosis not present

## 2023-05-22 DIAGNOSIS — M25511 Pain in right shoulder: Secondary | ICD-10-CM | POA: Diagnosis not present

## 2023-05-22 DIAGNOSIS — M25611 Stiffness of right shoulder, not elsewhere classified: Secondary | ICD-10-CM | POA: Diagnosis not present

## 2023-05-22 DIAGNOSIS — M75121 Complete rotator cuff tear or rupture of right shoulder, not specified as traumatic: Secondary | ICD-10-CM | POA: Diagnosis not present

## 2023-05-24 DIAGNOSIS — M25562 Pain in left knee: Secondary | ICD-10-CM | POA: Diagnosis not present

## 2023-07-14 DIAGNOSIS — I4510 Unspecified right bundle-branch block: Secondary | ICD-10-CM | POA: Diagnosis not present

## 2023-07-14 DIAGNOSIS — K82A1 Gangrene of gallbladder in cholecystitis: Secondary | ICD-10-CM | POA: Diagnosis not present

## 2023-07-14 DIAGNOSIS — K5730 Diverticulosis of large intestine without perforation or abscess without bleeding: Secondary | ICD-10-CM | POA: Diagnosis not present

## 2023-07-14 DIAGNOSIS — D3501 Benign neoplasm of right adrenal gland: Secondary | ICD-10-CM | POA: Diagnosis not present

## 2023-07-14 DIAGNOSIS — Z9049 Acquired absence of other specified parts of digestive tract: Secondary | ICD-10-CM | POA: Diagnosis not present

## 2023-07-14 DIAGNOSIS — K819 Cholecystitis, unspecified: Secondary | ICD-10-CM | POA: Diagnosis not present

## 2023-07-14 DIAGNOSIS — Z79899 Other long term (current) drug therapy: Secondary | ICD-10-CM | POA: Diagnosis not present

## 2023-07-14 DIAGNOSIS — Z880 Allergy status to penicillin: Secondary | ICD-10-CM | POA: Diagnosis not present

## 2023-07-14 DIAGNOSIS — I440 Atrioventricular block, first degree: Secondary | ICD-10-CM | POA: Diagnosis not present

## 2023-07-14 DIAGNOSIS — I10 Essential (primary) hypertension: Secondary | ICD-10-CM | POA: Diagnosis not present

## 2023-07-14 DIAGNOSIS — R110 Nausea: Secondary | ICD-10-CM | POA: Diagnosis not present

## 2023-07-14 DIAGNOSIS — R1032 Left lower quadrant pain: Secondary | ICD-10-CM | POA: Diagnosis not present

## 2023-07-14 DIAGNOSIS — K810 Acute cholecystitis: Secondary | ICD-10-CM | POA: Diagnosis not present

## 2023-07-14 DIAGNOSIS — R0602 Shortness of breath: Secondary | ICD-10-CM | POA: Diagnosis not present

## 2023-07-14 DIAGNOSIS — R9431 Abnormal electrocardiogram [ECG] [EKG]: Secondary | ICD-10-CM | POA: Diagnosis not present

## 2023-07-14 DIAGNOSIS — K828 Other specified diseases of gallbladder: Secondary | ICD-10-CM | POA: Diagnosis not present

## 2023-07-14 DIAGNOSIS — K812 Acute cholecystitis with chronic cholecystitis: Secondary | ICD-10-CM | POA: Diagnosis not present

## 2023-07-14 DIAGNOSIS — Z87891 Personal history of nicotine dependence: Secondary | ICD-10-CM | POA: Diagnosis not present

## 2023-07-15 DIAGNOSIS — K5730 Diverticulosis of large intestine without perforation or abscess without bleeding: Secondary | ICD-10-CM | POA: Diagnosis not present

## 2023-07-15 DIAGNOSIS — Z9049 Acquired absence of other specified parts of digestive tract: Secondary | ICD-10-CM | POA: Diagnosis not present

## 2023-07-16 DIAGNOSIS — K810 Acute cholecystitis: Secondary | ICD-10-CM | POA: Diagnosis not present

## 2023-07-16 DIAGNOSIS — Z87891 Personal history of nicotine dependence: Secondary | ICD-10-CM | POA: Diagnosis not present

## 2023-07-17 DIAGNOSIS — I10 Essential (primary) hypertension: Secondary | ICD-10-CM | POA: Diagnosis not present

## 2023-07-17 DIAGNOSIS — K812 Acute cholecystitis with chronic cholecystitis: Secondary | ICD-10-CM | POA: Diagnosis not present

## 2023-07-17 DIAGNOSIS — K810 Acute cholecystitis: Secondary | ICD-10-CM | POA: Diagnosis not present

## 2023-07-17 DIAGNOSIS — K819 Cholecystitis, unspecified: Secondary | ICD-10-CM | POA: Diagnosis not present

## 2023-07-22 DIAGNOSIS — T7840XA Allergy, unspecified, initial encounter: Secondary | ICD-10-CM | POA: Diagnosis not present

## 2023-07-22 DIAGNOSIS — Z743 Need for continuous supervision: Secondary | ICD-10-CM | POA: Diagnosis not present

## 2023-07-22 DIAGNOSIS — R21 Rash and other nonspecific skin eruption: Secondary | ICD-10-CM | POA: Diagnosis not present

## 2023-07-23 DIAGNOSIS — T360X5A Adverse effect of penicillins, initial encounter: Secondary | ICD-10-CM | POA: Diagnosis not present

## 2023-07-23 DIAGNOSIS — Z87891 Personal history of nicotine dependence: Secondary | ICD-10-CM | POA: Diagnosis not present

## 2023-07-23 DIAGNOSIS — L270 Generalized skin eruption due to drugs and medicaments taken internally: Secondary | ICD-10-CM | POA: Diagnosis not present

## 2023-07-23 DIAGNOSIS — R0602 Shortness of breath: Secondary | ICD-10-CM | POA: Diagnosis not present

## 2023-07-27 ENCOUNTER — Emergency Department (HOSPITAL_COMMUNITY)

## 2023-07-27 ENCOUNTER — Other Ambulatory Visit: Payer: Self-pay

## 2023-07-27 ENCOUNTER — Encounter (HOSPITAL_COMMUNITY): Payer: Self-pay | Admitting: *Deleted

## 2023-07-27 ENCOUNTER — Observation Stay (HOSPITAL_COMMUNITY)

## 2023-07-27 ENCOUNTER — Inpatient Hospital Stay (HOSPITAL_COMMUNITY)
Admission: EM | Admit: 2023-07-27 | Discharge: 2023-07-29 | DRG: 194 | Disposition: A | Discharge status: Home / Self Care | Attending: Student | Admitting: Student

## 2023-07-27 DIAGNOSIS — R7303 Prediabetes: Secondary | ICD-10-CM | POA: Diagnosis not present

## 2023-07-27 DIAGNOSIS — R918 Other nonspecific abnormal finding of lung field: Secondary | ICD-10-CM | POA: Diagnosis not present

## 2023-07-27 DIAGNOSIS — E876 Hypokalemia: Secondary | ICD-10-CM | POA: Diagnosis not present

## 2023-07-27 DIAGNOSIS — Z888 Allergy status to other drugs, medicaments and biological substances status: Secondary | ICD-10-CM

## 2023-07-27 DIAGNOSIS — R0602 Shortness of breath: Secondary | ICD-10-CM | POA: Diagnosis present

## 2023-07-27 DIAGNOSIS — E8770 Fluid overload, unspecified: Secondary | ICD-10-CM | POA: Diagnosis not present

## 2023-07-27 DIAGNOSIS — N400 Enlarged prostate without lower urinary tract symptoms: Secondary | ICD-10-CM | POA: Diagnosis not present

## 2023-07-27 DIAGNOSIS — J9811 Atelectasis: Secondary | ICD-10-CM | POA: Diagnosis not present

## 2023-07-27 DIAGNOSIS — Z79899 Other long term (current) drug therapy: Secondary | ICD-10-CM

## 2023-07-27 DIAGNOSIS — J189 Pneumonia, unspecified organism: Secondary | ICD-10-CM | POA: Diagnosis not present

## 2023-07-27 DIAGNOSIS — Z880 Allergy status to penicillin: Secondary | ICD-10-CM

## 2023-07-27 DIAGNOSIS — T7840XA Allergy, unspecified, initial encounter: Secondary | ICD-10-CM | POA: Diagnosis not present

## 2023-07-27 DIAGNOSIS — R609 Edema, unspecified: Secondary | ICD-10-CM | POA: Diagnosis not present

## 2023-07-27 DIAGNOSIS — R600 Localized edema: Secondary | ICD-10-CM | POA: Diagnosis not present

## 2023-07-27 DIAGNOSIS — J984 Other disorders of lung: Secondary | ICD-10-CM | POA: Diagnosis not present

## 2023-07-27 DIAGNOSIS — G4730 Sleep apnea, unspecified: Secondary | ICD-10-CM | POA: Diagnosis present

## 2023-07-27 DIAGNOSIS — Z66 Do not resuscitate: Secondary | ICD-10-CM | POA: Diagnosis present

## 2023-07-27 DIAGNOSIS — Z7982 Long term (current) use of aspirin: Secondary | ICD-10-CM | POA: Diagnosis not present

## 2023-07-27 DIAGNOSIS — R0601 Orthopnea: Secondary | ICD-10-CM | POA: Diagnosis present

## 2023-07-27 DIAGNOSIS — I10 Essential (primary) hypertension: Secondary | ICD-10-CM | POA: Diagnosis present

## 2023-07-27 DIAGNOSIS — K7469 Other cirrhosis of liver: Secondary | ICD-10-CM | POA: Diagnosis not present

## 2023-07-27 DIAGNOSIS — K7460 Unspecified cirrhosis of liver: Secondary | ICD-10-CM | POA: Insufficient documentation

## 2023-07-27 DIAGNOSIS — Z96653 Presence of artificial knee joint, bilateral: Secondary | ICD-10-CM | POA: Diagnosis present

## 2023-07-27 DIAGNOSIS — R0689 Other abnormalities of breathing: Secondary | ICD-10-CM | POA: Diagnosis not present

## 2023-07-27 DIAGNOSIS — I700 Atherosclerosis of aorta: Secondary | ICD-10-CM | POA: Diagnosis not present

## 2023-07-27 DIAGNOSIS — Z6841 Body Mass Index (BMI) 40.0 and over, adult: Secondary | ICD-10-CM

## 2023-07-27 DIAGNOSIS — R601 Generalized edema: Principal | ICD-10-CM

## 2023-07-27 DIAGNOSIS — K7581 Nonalcoholic steatohepatitis (NASH): Secondary | ICD-10-CM | POA: Diagnosis present

## 2023-07-27 DIAGNOSIS — Z9049 Acquired absence of other specified parts of digestive tract: Secondary | ICD-10-CM | POA: Diagnosis not present

## 2023-07-27 DIAGNOSIS — Y95 Nosocomial condition: Secondary | ICD-10-CM | POA: Diagnosis present

## 2023-07-27 DIAGNOSIS — T782XXA Anaphylactic shock, unspecified, initial encounter: Secondary | ICD-10-CM | POA: Diagnosis not present

## 2023-07-27 DIAGNOSIS — R0603 Acute respiratory distress: Secondary | ICD-10-CM | POA: Diagnosis not present

## 2023-07-27 DIAGNOSIS — G4733 Obstructive sleep apnea (adult) (pediatric): Secondary | ICD-10-CM | POA: Diagnosis present

## 2023-07-27 DIAGNOSIS — K7689 Other specified diseases of liver: Secondary | ICD-10-CM | POA: Diagnosis not present

## 2023-07-27 DIAGNOSIS — G8929 Other chronic pain: Secondary | ICD-10-CM | POA: Insufficient documentation

## 2023-07-27 DIAGNOSIS — R008 Other abnormalities of heart beat: Secondary | ICD-10-CM | POA: Diagnosis not present

## 2023-07-27 LAB — COMPREHENSIVE METABOLIC PANEL WITH GFR
ALT: 91 U/L — ABNORMAL HIGH (ref 0–44)
AST: 61 U/L — ABNORMAL HIGH (ref 15–41)
Albumin: 2.6 g/dL — ABNORMAL LOW (ref 3.5–5.0)
Alkaline Phosphatase: 59 U/L (ref 38–126)
Anion gap: 11 (ref 5–15)
BUN: 22 mg/dL (ref 8–23)
CO2: 28 mmol/L (ref 22–32)
Calcium: 8.9 mg/dL (ref 8.9–10.3)
Chloride: 104 mmol/L (ref 98–111)
Creatinine, Ser: 0.95 mg/dL (ref 0.61–1.24)
GFR, Estimated: >60 mL/min (ref >60)
Glucose, Bld: 104 mg/dL — ABNORMAL HIGH (ref 70–99)
Potassium: 3.7 mmol/L (ref 3.5–5.1)
Sodium: 143 mmol/L (ref 135–145)
Total Bilirubin: 0.7 mg/dL (ref 0.0–1.2)
Total Protein: 5.6 g/dL — ABNORMAL LOW (ref 6.5–8.1)

## 2023-07-27 LAB — CBC WITH DIFFERENTIAL/PLATELET
Abs Immature Granulocytes: 0.82 10*3/uL — ABNORMAL HIGH (ref 0.00–0.07)
Basophils Absolute: 0.1 10*3/uL (ref 0.0–0.1)
Basophils Relative: 1 %
Eosinophils Absolute: 0.8 10*3/uL — ABNORMAL HIGH (ref 0.0–0.5)
Eosinophils Relative: 5 %
HCT: 36.7 % — ABNORMAL LOW (ref 39.0–52.0)
Hemoglobin: 11.5 g/dL — ABNORMAL LOW (ref 13.0–17.0)
Immature Granulocytes: 5 %
Lymphocytes Relative: 15 %
Lymphs Abs: 2.4 10*3/uL (ref 0.7–4.0)
MCH: 30.3 pg (ref 26.0–34.0)
MCHC: 31.3 g/dL (ref 30.0–36.0)
MCV: 96.6 fL (ref 80.0–100.0)
Monocytes Absolute: 0.7 10*3/uL (ref 0.1–1.0)
Monocytes Relative: 4 %
Neutro Abs: 10.9 10*3/uL — ABNORMAL HIGH (ref 1.7–7.7)
Neutrophils Relative %: 70 %
Platelets: 398 10*3/uL (ref 150–400)
RBC: 3.8 MIL/uL — ABNORMAL LOW (ref 4.22–5.81)
RDW: 15 % (ref 11.5–15.5)
Smear Review: NORMAL
WBC: 15.7 10*3/uL — ABNORMAL HIGH (ref 4.0–10.5)
nRBC: 0 % (ref 0.0–0.2)

## 2023-07-27 LAB — LIPASE, BLOOD: Lipase: 22 U/L (ref 11–51)

## 2023-07-27 LAB — MRSA NEXT GEN BY PCR, NASAL: MRSA by PCR Next Gen: NOT DETECTED

## 2023-07-27 LAB — PROCALCITONIN: Procalcitonin: 0.29 ng/mL

## 2023-07-27 LAB — TROPONIN I (HIGH SENSITIVITY)
Troponin I (High Sensitivity): 7 ng/L (ref <18)
Troponin I (High Sensitivity): 11 ng/L (ref <18)

## 2023-07-27 LAB — HEMOGLOBIN A1C
Hgb A1c MFr Bld: 6 % — ABNORMAL HIGH (ref 4.8–5.6)
Mean Plasma Glucose: 125.5 mg/dL

## 2023-07-27 LAB — BRAIN NATRIURETIC PEPTIDE: B Natriuretic Peptide: 237.7 pg/mL — ABNORMAL HIGH (ref 0.0–100.0)

## 2023-07-27 LAB — TSH: TSH: 1.709 u[IU]/mL (ref 0.350–4.500)

## 2023-07-27 LAB — D-DIMER, QUANTITATIVE: D-Dimer, Quant: 4.82 ug{FEU}/mL — ABNORMAL HIGH (ref 0.00–0.50)

## 2023-07-27 LAB — HIV ANTIBODY (ROUTINE TESTING W REFLEX): HIV Screen 4th Generation wRfx: NONREACTIVE

## 2023-07-27 MED ORDER — VANCOMYCIN HCL IN DEXTROSE 1-5 GM/200ML-% IV SOLN: 1000.0000 mg | Freq: Once | INTRAVENOUS | Status: DC

## 2023-07-27 MED ORDER — FUROSEMIDE 10 MG/ML IJ SOLN
60.0000 mg | Freq: Once | INTRAMUSCULAR | Status: AC
  Administered 2023-07-27: 60 mg via INTRAVENOUS
  Filled 2023-07-27 (×2): qty 6

## 2023-07-27 MED ORDER — POLYETHYLENE GLYCOL 3350 17 G PO PACK: 17.0000 g | PACK | Freq: Two times a day (BID) | ORAL | Status: DC | PRN

## 2023-07-27 MED ORDER — ACETAMINOPHEN 650 MG RE SUPP: 650.0000 mg | Freq: Four times a day (QID) | RECTAL | Status: DC | PRN

## 2023-07-27 MED ORDER — VANCOMYCIN HCL 1750 MG/350ML IV SOLN
1750.0000 mg | INTRAVENOUS | Status: DC
  Filled 2023-07-27: qty 350

## 2023-07-27 MED ORDER — ENOXAPARIN SODIUM 60 MG/0.6ML IJ SOSY
0.5000 mg/kg | PREFILLED_SYRINGE | INTRAMUSCULAR | Status: DC
  Administered 2023-07-27 – 2023-07-28 (×2): 60 mg via SUBCUTANEOUS
  Filled 2023-07-27 (×3): qty 0.6

## 2023-07-27 MED ORDER — SENNOSIDES-DOCUSATE SODIUM 8.6-50 MG PO TABS: 1.0000 | ORAL_TABLET | Freq: Two times a day (BID) | ORAL | Status: DC | PRN

## 2023-07-27 MED ORDER — IOHEXOL 350 MG/ML SOLN
75.0000 mL | Freq: Once | INTRAVENOUS | Status: AC | PRN
  Administered 2023-07-27: 75 mL via INTRAVENOUS

## 2023-07-27 MED ORDER — ONDANSETRON HCL 4 MG/2ML IJ SOLN
4.0000 mg | Freq: Four times a day (QID) | INTRAMUSCULAR | Status: DC | PRN
Start: 2023-07-27 — End: 2023-07-29

## 2023-07-27 MED ORDER — SODIUM CHLORIDE 0.9 % IV SOLN
2.0000 g | Freq: Once | INTRAVENOUS | Status: AC
  Administered 2023-07-27: 2 g via INTRAVENOUS
  Filled 2023-07-27: qty 12.5

## 2023-07-27 MED ORDER — METHYLPREDNISOLONE SODIUM SUCC 125 MG IJ SOLR
125.0000 mg | Freq: Once | INTRAMUSCULAR | Status: AC
  Administered 2023-07-27: 125 mg via INTRAVENOUS
  Filled 2023-07-27: qty 2

## 2023-07-27 MED ORDER — LOSARTAN POTASSIUM 50 MG PO TABS
50.0000 mg | ORAL_TABLET | Freq: Every day | ORAL | Status: DC
  Administered 2023-07-28 – 2023-07-29 (×2): 50 mg via ORAL
  Filled 2023-07-27 (×2): qty 1

## 2023-07-27 MED ORDER — ONDANSETRON HCL 4 MG PO TABS: 4.0000 mg | ORAL_TABLET | Freq: Four times a day (QID) | ORAL | Status: DC | PRN

## 2023-07-27 MED ORDER — CARVEDILOL 25 MG PO TABS
25.0000 mg | ORAL_TABLET | Freq: Two times a day (BID) | ORAL | Status: DC
  Administered 2023-07-28 – 2023-07-29 (×3): 25 mg via ORAL
  Filled 2023-07-27 (×3): qty 1

## 2023-07-27 MED ORDER — VANCOMYCIN HCL 2000 MG/400ML IV SOLN
2000.0000 mg | Freq: Once | INTRAVENOUS | Status: AC
  Administered 2023-07-27: 2000 mg via INTRAVENOUS
  Filled 2023-07-27 (×2): qty 400

## 2023-07-27 MED ORDER — SODIUM CHLORIDE 0.9 % IV SOLN
2.0000 g | Freq: Three times a day (TID) | INTRAVENOUS | Status: DC
  Administered 2023-07-28 (×2): 2 g via INTRAVENOUS
  Filled 2023-07-27 (×2): qty 12.5

## 2023-07-27 MED ORDER — ACETAMINOPHEN 325 MG PO TABS
650.0000 mg | ORAL_TABLET | Freq: Four times a day (QID) | ORAL | Status: DC | PRN
  Filled 2023-07-27: qty 2

## 2023-07-27 NOTE — ED Provider Triage Note (Signed)
 Emergency Medicine Provider Triage Evaluation Note  [name] , a 73 y.o. male  was evaluated in triage.  Pt complains of SOB, concern for allergic rxn still. Had GB removed, been on penicillin got rash, stopped 3 daysa go and has been on steriods. Still with SOB, just in hospital for 3 days for GB removal.  Review of Systems  Positive: sob Negative: cp  Physical Exam  BP 125/83   Pulse 61   Temp 98 F (36.7 C)   Resp 18   SpO2 95%  Gen:   Awake, no distress   Resp:  Normal effort  MSK:   Moves extremities without difficulty  Other:    Medical Decision Making  Medically screening exam initiated at 9:37 AM.  Appropriate orders placed.  [name] was informed that the remainder of the evaluation will be completed by another provider, this initial triage assessment does not replace that evaluation, and the importance of remaining in the ED until their evaluation is complete.     Kyle Rue, DO 07/27/23 [id number]

## 2023-07-27 NOTE — H&P (Addendum)
 History and Physical    Patient: [name] ZOX:[id number] DOB: [birth year] DOA: 07/27/2023 DOS: the patient was seen and examined on 07/27/2023 PCP: Cornelio Dike, MD  Patient coming from: Home  Chief Complaint:  Chief Complaint  Patient presents with   Shortness of Breath   Allergic Reaction   HPI: [name] is a 73 y.o. male with PMH of right TKR, HTN, morbid obesity, BPH, OSA not on CPAP, morbid obesity and recent gallbladder surgery on 4/21 presenting with shortness of breath, orthopnea, abdominal swelling, lower extremity edema and redness.  Patient had cholecystectomy at [facility] on 4/21.  He was discharged on amoxicillin although he was allergic to penicillin.  Seen in ED at [facility] on 4/28 with complaints of shortness of breath, bilateral hand swelling and hives.   He was treated with steroid, Benadryl .  Augmentin discontinued and he was discharged on p.o. Cipro.  Patient reports improvement with lower extremity erythema after the above intervention.  However, he continued to experience shortness of breath, orthopnea, abdominal swelling and fullness.  He denies nausea, vomiting, diarrhea, constipation or UTI symptoms.  He reports fever to 103.4 on Tuesday.  Reports productive cough with clear phlegm.  Patient denies ever smoking cigarette.  Admits to occasional wine but denies ever drinking heavily.  Denies recreational drug use.  Lives with his wife.  He is not interested in cardiopulmonary resuscitation in an event of sudden cardiopulmonary arrest.  Patient's wife in agreement.  In ED, slightly hypertensive and tachypneic.  No fever or oxygen requirement.  Mild LFT elevation on CMP.  WBC 15.7 with left shift.  Hgb 11.5.  Troponin negative x 2.  BNP 237.  D-dimer 4.82.  CT angio chest negative for PE but possible RLL atelectasis and liver cirrhosis.  Received IV Solu-Medrol , vancomycin  and cefepime .  Admission requested for HCAP.   Review of Systems: As mentioned in the  history of present illness. All other systems reviewed and are negative. Past Medical History:  Diagnosis Date   Arthritis    Depression 11/02/2019   Hypertension    Past Surgical History:  Procedure Laterality Date   CARPAL TUNNEL RELEASE Bilateral 03/28/2003   COLON SURGERY  03/27/2001   diverticulitis   COLON SURGERY     JOINT REPLACEMENT Bilateral 03/27/2010   knees   NASAL SEPTOPLASTY W/ TURBINOPLASTY Bilateral 07/03/2022   Procedure: NASAL SEPTOPLASTY WITH TURBINATE REDUCTION;  Surgeon: Janita Mellow, MD;  Location: [facility];  Service: ENT;  Laterality: Bilateral;   SHOULDER ARTHROSCOPY Left 06/25/2015   Procedure: ARTHROSCOPY SHOULDER;  Surgeon: Neil Balls, MD;  Location: [facility];  Service: Orthopedics;  Laterality: Left;  Left shoulder arthroscopy with subacromial decompression and distal clavicle resection.    SHOULDER OPEN ROTATOR CUFF REPAIR Left 06/25/2015   Procedure: ROTATOR CUFF REPAIR SHOULDER OPEN;  Surgeon: Neil Balls, MD;  Location: [facility];  Service: Orthopedics;  Laterality: Left;   TOTAL KNEE REVISION Right 04/25/2021   Procedure: KNEE REVISION two componet quadriceps tendon repair;  Surgeon: Neil Balls, MD;  Location: [facility];  Service: Orthopedics;  Laterality: Right;   TOTAL KNEE REVISION Right 07/17/2022   Procedure: RIGHT TOTAL KNEE REVISION;  Surgeon: Wendolyn Hamburger, MD;  Location: [facility];  Service: Orthopedics;  Laterality: Right;   TRIGGER FINGER RELEASE Right    Social History:  reports that he has never smoked. He does not have any smokeless tobacco history on file. He reports current alcohol use. He reports that he does not  use drugs.  Allergies  Allergen Reactions   Beta-Lactamase Inhibitors (Beta-Lactam) Hives, Shortness Of Breath and Swelling    Amoxicillin, ciprofloxacin   Spironolactone  Other (See Comments)    Gynecomastia    Lisinopril Swelling   Penicillins Hives   Amlodipine Swelling and Other  (See Comments)    History reviewed. No pertinent family history.  Prior to Admission medications   Medication Sig Start Date End Date Taking? Authorizing Provider  ciprofloxacin (CIPRO) 500 MG tablet Take 500 mg by mouth 2 (two) times daily. 07/23/23 07/27/23 Yes [provider]  HYDROcodone -acetaminophen  (NORCO/VICODIN) 5-325 MG tablet Take 1 tablet by mouth every 6 (six) hours as needed for moderate pain (pain score 4-6).   Yes [provider]  methylPREDNISolone  (MEDROL ) 4 MG tablet Take 4-24 mg by mouth See admin instructions. Take 6 tablets on day 1 as directed on package and decrease by 1 tab each day for a total of 6 days.   Yes [provider]  oxyCODONE  (OXY IR/ROXICODONE ) 5 MG immediate release tablet Take 5 mg by mouth every 6 (six) hours as needed. 05/07/23  Yes [provider]  aspirin  EC 81 MG tablet Take 1 tablet (81 mg total) by mouth 2 (two) times daily. 07/17/22   Sheron Dixons, PA-C  carvedilol  (COREG ) 25 MG tablet Take 25 mg by mouth 2 (two) times daily with a meal.    [provider]  chlorthalidone  (HYGROTON ) 25 MG tablet Take 25 mg by mouth daily.    [provider]  finasteride  (PROSCAR ) 5 MG tablet Take 5 mg by mouth daily.    [provider]  losartan  (COZAAR ) 100 MG tablet Take 100 mg by mouth daily.    [provider]  magnesium oxide (MAG-OX) 400 (240 Mg) MG tablet Take 400 mg by mouth daily.    [provider]  sodium chloride  (OCEAN) 0.65 % SOLN nasal spray Place 1 spray into both nostrils as needed for congestion.    [provider]  tiZANidine  (ZANAFLEX ) 2 MG tablet Take 1 tablet (2 mg total) by mouth every 6 (six) hours as needed for muscle spasms. 07/17/22   Sheron Dixons, PA-C  traZODone  (DESYREL ) 100 MG tablet Take 100 mg by mouth at bedtime.    [provider]    Physical Exam: Vitals:   07/27/23 1530 07/27/23 1600 07/27/23 1700 07/27/23 1724  BP: (!)  181/112 (!) 161/87 (!) 171/90   Pulse: 86 89 87   Resp: (!) 21 (!) 26 (!) 22   Temp:    98.9 F (37.2 C)  TempSrc:    Oral  SpO2: 94% 97% 98%   Weight:      Height:       GENERAL: No apparent distress.  Nontoxic. HEENT: MMM.  Vision and hearing grossly intact.  NECK: Supple.  Difficult to assess JVD due to body habitus. RESP:  No IWOB.  Fair aeration bilaterally.  Bibasilar crackles. CVS:  RRR. Heart sounds normal.  ABD/GI/GU: BS+. Abd slightly distended.  Nontender. MSK/EXT:   No apparent deformity. Moves extremities.  2+ BLE edema.  SKIN: BLE erythema with increased warmth to touch. NEURO: Awake and alert. Oriented appropriately.  No apparent focal neuro deficit. PSYCH: Calm. Normal affect.   Data Reviewed: See HPI  Assessment and Plan: Respiratory distress: Presents with SOB, orthopnea, edema, abdominal distention/fullness.  Concerning for new acute CHF/possible RV failure.  CT angio negative for PE but possible RLL atelectasis.  HCAP is another possibility here.  No prior history of CHF.  BNP 237 but not reliable given morbid obesity.  No pulmonary edema on CXR.  Received vancomycin , cefepime  and Solu-Medrol  in ED -Trial of IV Lasix -60 mg once.  Will redose in the morning based on response -MRSA PCR, procalcitonin -Continue broad-spectrum antibiotics -Echocardiogram, TSH -Strict intake and output, daily weight, renal functions and electrolytes  NASH cirrhosis/abdominal distention/elevated liver enzymes: CT angio shows liver cirrhosis.  No ascites on exam but abdomen distended. -Diuretics as above - Abdominal ultrasound to assess for ascites - Monitor LFT  Drug reaction: Patient with penicillin allergy history and discharged from [facility] after a cholecystectomy on Augmentin.  Seen in ED at [facility] and treated.  Still with significant BLE erythema.  Unclear if this is drug reaction, cellulitis or venous insufficiency.  Received IV Solu-Medrol  in ED. - Hold off more steroid at  this time - Antibiotics and diuretics as above  Essential hypertension: Seems to be on Coreg , Hygroton , losartan  at home.  BP slightly elevated here - Resume home meds as appropriate after med rec - Diuretics as above  OSA not on CPAP-intolerant  BPH without LUTS -Continue home meds after med rec  Morbid obesity Body mass index is 41.33 kg/m. -Encourage lifestyle change to lose weight    Advance Care Planning:   Code Status: Limited: Do not attempt resuscitation (DNR) -DNR-LIMITED -Do Not Intubate/DNI    Consults: None  Family Communication: Updated patient's wife at bedside  Severity of Illness: The appropriate patient status for this patient is OBSERVATION. Observation status is judged to be reasonable and necessary in order to provide the required intensity of service to ensure the patient's safety. The patient's presenting symptoms, physical exam findings, and initial radiographic and laboratory data in the context of their medical condition is felt to place them at decreased risk for further clinical deterioration. Furthermore, it is anticipated that the patient will be medically stable for discharge from the hospital within 2 midnights of admission.   Author: Theadore Finger, MD 07/27/2023 6:15 PM  For on call review [email or web address].

## 2023-07-27 NOTE — ED Provider Notes (Signed)
  Physical Exam  BP (!) 161/87   Pulse 89   Temp 99.1 F (37.3 C) (Oral)   Resp (!) 26   Ht 5\' 7"  (1.702 m)   Wt 119.7 kg   SpO2 97%   BMI 41.33 kg/m   Physical Exam  Procedures  Procedures  ED Course / MDM    Medical Decision Making Care assumed at 3 PM.  Patient is here with concern for possible drug reaction.  Patient had a cholecystectomy at [facility] on 4/21.  Patient was sent home with Augmentin then had a possible drug reaction.  Patient then went to [facility] ER and was switched to Cipro.  Patient then follow-up with PCP 3 days ago and was put on steroids.  Patient states that the hives has improved.  Despite that he has persistent shortness of breath and arm and leg swelling.  Patient's D-dimer was elevated and CTA showed a possible pneumonia but no PE and signed out pending BNP and admission  4:51 PM BNP is 200.  Patient received IV antibiotics already for pneumonia.  Also ordered Solu-Medrol .  At this point patient will be admitted for HCAP and also anasarca likely from drug reaction.  Amount and/or Complexity of Data Reviewed Labs: ordered.  Risk Prescription drug management.          Dalene Duck, MD 07/27/23 [phone]

## 2023-07-27 NOTE — Progress Notes (Signed)
 PHARMACY ANTIBIOTIC CONSULT NOTE   [name] a 73 y.o. male admitted with c/f HAP / cellulitis .  Pharmacy has been consulted for Vancomycin  and Cefepime  dosing. CT chest with RLL subsegmental atelectasis- PNA not excluded. Of note patient is saturating 95% on room air.   S/P recent gallbladder surgery on 4/21. Pt self-reports high temperature earlier in the week with productive cough and phlegm. Pt also ahd bilateral hand swelling and hives which is believed 2/2 to a penicillin allergy.   S/P 2g vancomycin  1638, 2g CFP 1516   5/2: Scr 0.95 (baseline), WBC 15.7  Vital Signs stable, afebrile   Estimated Creatinine Clearance: 89.5 mL/min (by C-G formula based on SCr of 0.95 mg/dL).  Plan: START Cefepime  2g IV Q8H- first dose 2300  Vancomycin  1,750 mg IV Q24H (Scr used: 0.95, Vd used: 0.5, eAUC: 483) Monitor renal function, clinical status, de-escalation, C/S, levels as indicated  F/U MRSA PCR, PCT   Allergies:  Allergies  Allergen Reactions   Beta-Lactamase Inhibitors (Beta-Lactam) Hives, Shortness Of Breath and Swelling    Amoxicillin, ciprofloxacin   Spironolactone  Other (See Comments)    Gynecomastia    Lisinopril Swelling   Penicillins Hives   Amlodipine Swelling and Other (See Comments)    Filed Weights   07/27/23 0944  Weight: 119.7 kg (263 lb 14.3 oz)       Latest Ref Rng & Units 07/27/2023    9:58 AM 07/11/2022    2:20 PM 04/26/2021    3:06 AM  CBC  WBC 4.0 - 10.5 K/uL 15.7  7.2  13.8   Hemoglobin 13.0 - 17.0 g/dL 82.9  56.2  13.0   Hematocrit 39.0 - 52.0 % 36.7  45.8  43.5   Platelets 150 - 400 K/uL 398  240  205     Antibiotics Given (last 72 hours)     Date/Time Action Medication Dose Rate   07/27/23 1516 New Bag/Given   ceFEPIme  (MAXIPIME ) 2 g in sodium chloride  0.9 % 100 mL IVPB 2 g 200 mL/hr   07/27/23 1638 New Bag/Given   vancomycin  (VANCOREADY) IVPB 2000 mg/400 mL 2,000 mg 200 mL/hr       Chrystie Crass, PharmD Clinical Pharmacist  07/27/2023  6:24 PM

## 2023-07-27 NOTE — ED Provider Notes (Signed)
 [place] [facility] Provider Note   CSN: [id number] Arrival date & time: 07/27/23  0920     History  Chief Complaint  Patient presents with   Shortness of Breath   Allergic Reaction    [name] is a 73 y.o. male.  HPI Patient presents with his wife assists with the history.  In essence patient with multiple medical problems including hypertension, though he does not see a primary care physician now presents with concern for anasarca, cutaneous changes, dyspnea and fatigue. History is most notable for cholecystectomy, with drain placed 2 weeks ago.  Patient was on amoxicillin, may have developed allergic reaction, has been seen by his primary care physician and ED once since then, has had switched to ciprofloxacin due to after mentioned concerns.  In spite of this switch patient has new fatigue, dyspnea with exertion, but no chest pain, no vomiting, though he does have nausea, no real abdominal pain though he does have unsettled sensation.  No mental status changes.  Prior care at [facility] in [place], with concern for respiratory and cardiac implications wife brings him here for evaluation.    Home Medications Prior to Admission medications   Medication Sig Start Date End Date Taking? Authorizing Provider  ciprofloxacin (CIPRO) 500 MG tablet Take 500 mg by mouth 2 (two) times daily. 07/23/23 07/27/23 Yes [provider]  HYDROcodone -acetaminophen  (NORCO/VICODIN) 5-325 MG tablet Take 1 tablet by mouth every 6 (six) hours as needed for moderate pain (pain score 4-6).   Yes [provider]  methylPREDNISolone  (MEDROL ) 4 MG tablet Take 4-24 mg by mouth See admin instructions. Take 6 tablets on day 1 as directed on package and decrease by 1 tab each day for a total of 6 days.   Yes [provider]  oxyCODONE  (OXY IR/ROXICODONE ) 5 MG immediate release tablet Take 5 mg by mouth every 6 (six) hours as needed. 05/07/23  Yes  [provider]  aspirin  EC 81 MG tablet Take 1 tablet (81 mg total) by mouth 2 (two) times daily. 07/17/22   Sheron Dixons, PA-C  carvedilol  (COREG ) 25 MG tablet Take 25 mg by mouth 2 (two) times daily with a meal.    [provider]  chlorthalidone  (HYGROTON ) 25 MG tablet Take 25 mg by mouth daily.    [provider]  finasteride  (PROSCAR ) 5 MG tablet Take 5 mg by mouth daily.    [provider]  losartan  (COZAAR ) 100 MG tablet Take 100 mg by mouth daily.    [provider]  magnesium oxide (MAG-OX) 400 (240 Mg) MG tablet Take 400 mg by mouth daily.    [provider]  sodium chloride  (OCEAN) 0.65 % SOLN nasal spray Place 1 spray into both nostrils as needed for congestion.    [provider]  tiZANidine  (ZANAFLEX ) 2 MG tablet Take 1 tablet (2 mg total) by mouth every 6 (six) hours as needed for muscle spasms. 07/17/22   Sheron Dixons, PA-C  traZODone  (DESYREL ) 100 MG tablet Take 100 mg by mouth at bedtime.    [provider]      Allergies    Beta-lactamase inhibitors (beta-lactam), Spironolactone , Lisinopril, Penicillins, and Amlodipine    Review of Systems   Review of Systems  Physical Exam Updated Vital Signs BP (!) 180/95   Pulse 84   Temp 99.1 F (37.3 C) (Oral)   Resp (!) 25   Ht 5\' 7"  (1.702 m)   Wt 119.7  kg   SpO2 100%   BMI 41.33 kg/m  Physical Exam Vitals and nursing note reviewed.  Constitutional:      General: He is not in acute distress.    Appearance: He is well-developed.  HENT:     Head: Normocephalic and atraumatic.  Eyes:     Conjunctiva/sclera: Conjunctivae normal.  Cardiovascular:     Rate and Rhythm: Normal rate and regular rhythm.  Pulmonary:     Effort: Tachypnea and accessory muscle usage present.  Abdominal:     General: There is no distension.  Musculoskeletal:     Right lower leg: Edema present.     Left lower leg: Edema present.  Skin:    General: Skin is warm  and dry.     Comments: Vasculitis-like changes of both LE  Neurological:     Mental Status: He is alert and oriented to person, place, and time.     ED Results / Procedures / Treatments   Labs (all labs ordered are listed, but only abnormal results are displayed) Labs Reviewed  CBC WITH DIFFERENTIAL/PLATELET - Abnormal; Notable for the following components:      Result Value   WBC 15.7 (*)    RBC 3.80 (*)    Hemoglobin 11.5 (*)    HCT 36.7 (*)    Neutro Abs 10.9 (*)    Eosinophils Absolute 0.8 (*)    Abs Immature Granulocytes 0.82 (*)    All other components within normal limits  COMPREHENSIVE METABOLIC PANEL WITH GFR - Abnormal; Notable for the following components:   Glucose, Bld 104 (*)    Total Protein 5.6 (*)    Albumin 2.6 (*)    AST 61 (*)    ALT 91 (*)    All other components within normal limits  D-DIMER, QUANTITATIVE - Abnormal; Notable for the following components:   D-Dimer, Quant 4.82 (*)    All other components within normal limits  LIPASE, BLOOD  BRAIN NATRIURETIC PEPTIDE  TROPONIN I (HIGH SENSITIVITY)  TROPONIN I (HIGH SENSITIVITY)    EKG EKG Interpretation Date/Time:  Friday Jul 27 2023 12:29:44 EDT Ventricular Rate:  74 PR Interval:  196 QRS Duration:  110 QT Interval:  429 QTC Calculation: 476 R Axis:   24  Text Interpretation: Sinus rhythm Borderline prolonged QT interval Confirmed by Dorenda Gandy [id number]) on 07/27/2023 12:41:53 PM  Radiology CT Angio Chest PE W and/or Wo Contrast Result Date: 07/27/2023 CLINICAL DATA:  Concern for pulmonary embolism. EXAM: CT ANGIOGRAPHY CHEST WITH CONTRAST TECHNIQUE: Multidetector CT imaging of the chest was performed using the standard protocol during bolus administration of intravenous contrast. Multiplanar CT image reconstructions and MIPs were obtained to evaluate the vascular anatomy. RADIATION DOSE REDUCTION: This exam was performed according to the departmental dose-optimization program which includes  automated exposure control, adjustment of the mA and/or kV according to patient size and/or use of iterative reconstruction technique. CONTRAST:  75mL OMNIPAQUE IOHEXOL 350 MG/ML SOLN COMPARISON:  Chest radiograph dated 07/27/2023. FINDINGS: Cardiovascular: There is no cardiomegaly or pericardial effusion. Mild atherosclerotic calcification of the thoracic aorta. No aneurysmal dilatation with dissection. Evaluation of the pulmonary arteries is limited due to respiratory motion and suboptimal visualization of the peripheral branches. No central pulmonary artery embolus identified. Mediastinum/Nodes: No hilar or mediastinal adenopathy. Esophagus is grossly unremarkable no mediastinal fluid collection. Lungs/Pleura: Right lung base subsegmental atelectasis. Pneumonia is not excluded. There is no pleural effusion or pneumothorax. The central airways are patent. Upper Abdomen: Cirrhosis. Musculoskeletal: No acute osseous  pathology. Review of the MIP images confirms the above findings. IMPRESSION: 1. No central pulmonary artery embolus. 2. Right lung base subsegmental atelectasis. Pneumonia is not excluded. 3. Cirrhosis. 4.  Aortic Atherosclerosis (ICD10-I70.0). Electronically Signed   By: Angus Bark M.D.   On: 07/27/2023 13:47   DG Chest 2 View Result Date: 07/27/2023 CLINICAL DATA:  Shortness of breath. Recent allergic reaction to penicillin. EXAM: CHEST - 2 VIEW COMPARISON:  X-ray 07/11/2022 FINDINGS: Underinflation. Bandlike opacity seen at the lung bases, right-greater-than-left. Atelectasis is favored over infiltrate. No pneumothorax or effusion. No edema. Stable cardiopericardial silhouette. IMPRESSION: Underinflation with some bandlike lung base opacities. Atelectasis is favored over infiltrate. Simple follow-up. Electronically Signed   By: Adrianna Horde M.D.   On: 07/27/2023 10:57    Procedures Procedures    Medications Ordered in ED Medications  ceFEPIme  (MAXIPIME ) 2 g in sodium chloride  0.9 %  100 mL IVPB (2 g Intravenous New Bag/Given 07/27/23 1516)  vancomycin  (VANCOREADY) IVPB 2000 mg/400 mL (has no administration in time range)  iohexol (OMNIPAQUE) 350 MG/ML injection 75 mL (75 mLs Intravenous Contrast Given 07/27/23 1305)    ED Course/ Medical Decision Making/ A&P                                 Medical Decision Making Patient presents with anasarca, dyspnea, vasculitis like changes.  Includes anaphylaxis, cardiomyopathy, vasculitis, fluid overload, infection.  Without true abdominal pain, low suspicion for postop infection of his gallbladder site.  Patient's mentations is appropriate, reassuring. Patient had labs from triage notable for D-dimer elevation. Cardiac 80 sinus normal pulse ox 99% room air normal though this decreases with activity  Amount and/or Complexity of Data Reviewed Independent Historian: spouse External Data Reviewed: notes.    Details: Outpatient hospital notes reviewed Labs: ordered. Decision-making details documented in ED Course. Radiology: independent interpretation performed. ECG/medicine tests: independent interpretation performed. Decision-making details documented in ED Course.  Risk Prescription drug management.   3:24 PM Patient calm, CT PE negative.  CT PE, x-ray both concerning for possible pneumonia.  Patient awaiting BNP, for evaluation of possible new cardiomyopathy which would also explain his anasarca.  No evidence of bacteremia or sepsis, both tachypnea, new pneumonia patient is started antibiotics after I discussed his allergies with our pharmacy team.  Patient will be admitted for further monitoring, management, pending remaining labs.   Final Clinical Impression(s) / ED Diagnoses Final diagnoses:  Anasarca  HCAP (healthcare-associated pneumonia)    CRITICAL CARE Performed by: Dorenda Gandy Total critical care time: 35 minutes Critical care time was exclusive of separately billable procedures and treating other  patients. Critical care was necessary to treat or prevent imminent or life-threatening deterioration. Critical care was time spent personally by me on the following activities: development of treatment plan with patient and/or surrogate as well as nursing, discussions with consultants, evaluation of patient's response to treatment, examination of patient, obtaining history from patient or surrogate, ordering and performing treatments and interventions, ordering and review of laboratory studies, ordering and review of radiographic studies, pulse oximetry and re-evaluation of patient's condition.    Dorenda Gandy, MD 07/27/23 1525

## 2023-07-27 NOTE — ED Notes (Signed)
 Patient transported to CT

## 2023-07-27 NOTE — ED Triage Notes (Addendum)
 Presents to ed via ems c/o poss. Allergic reaction states he was put on amoxicillin after his gallbladder surgery states he took it last thurs , fri and sat and dev. A rash on his arms with sob. This am noticed bilateral edema to lower ext. And redness.

## 2023-07-28 ENCOUNTER — Observation Stay (HOSPITAL_COMMUNITY)

## 2023-07-28 DIAGNOSIS — Y95 Nosocomial condition: Secondary | ICD-10-CM | POA: Diagnosis present

## 2023-07-28 DIAGNOSIS — I10 Essential (primary) hypertension: Secondary | ICD-10-CM | POA: Diagnosis not present

## 2023-07-28 DIAGNOSIS — E876 Hypokalemia: Secondary | ICD-10-CM | POA: Diagnosis present

## 2023-07-28 DIAGNOSIS — R008 Other abnormalities of heart beat: Secondary | ICD-10-CM

## 2023-07-28 DIAGNOSIS — E8770 Fluid overload, unspecified: Secondary | ICD-10-CM | POA: Diagnosis present

## 2023-07-28 DIAGNOSIS — R600 Localized edema: Secondary | ICD-10-CM | POA: Diagnosis not present

## 2023-07-28 DIAGNOSIS — R0603 Acute respiratory distress: Secondary | ICD-10-CM

## 2023-07-28 DIAGNOSIS — R0602 Shortness of breath: Secondary | ICD-10-CM | POA: Diagnosis not present

## 2023-07-28 DIAGNOSIS — J189 Pneumonia, unspecified organism: Secondary | ICD-10-CM | POA: Diagnosis present

## 2023-07-28 DIAGNOSIS — Z7982 Long term (current) use of aspirin: Secondary | ICD-10-CM | POA: Diagnosis not present

## 2023-07-28 DIAGNOSIS — Z6841 Body Mass Index (BMI) 40.0 and over, adult: Secondary | ICD-10-CM | POA: Diagnosis not present

## 2023-07-28 DIAGNOSIS — G4730 Sleep apnea, unspecified: Secondary | ICD-10-CM | POA: Diagnosis not present

## 2023-07-28 DIAGNOSIS — Z79899 Other long term (current) drug therapy: Secondary | ICD-10-CM | POA: Diagnosis not present

## 2023-07-28 DIAGNOSIS — R7303 Prediabetes: Secondary | ICD-10-CM | POA: Diagnosis present

## 2023-07-28 DIAGNOSIS — R0601 Orthopnea: Secondary | ICD-10-CM | POA: Diagnosis not present

## 2023-07-28 DIAGNOSIS — N400 Enlarged prostate without lower urinary tract symptoms: Secondary | ICD-10-CM

## 2023-07-28 DIAGNOSIS — Z888 Allergy status to other drugs, medicaments and biological substances status: Secondary | ICD-10-CM | POA: Diagnosis not present

## 2023-07-28 DIAGNOSIS — K7581 Nonalcoholic steatohepatitis (NASH): Secondary | ICD-10-CM | POA: Diagnosis present

## 2023-07-28 DIAGNOSIS — K7469 Other cirrhosis of liver: Secondary | ICD-10-CM

## 2023-07-28 DIAGNOSIS — G4733 Obstructive sleep apnea (adult) (pediatric): Secondary | ICD-10-CM | POA: Diagnosis present

## 2023-07-28 DIAGNOSIS — Z96653 Presence of artificial knee joint, bilateral: Secondary | ICD-10-CM | POA: Diagnosis present

## 2023-07-28 DIAGNOSIS — Z66 Do not resuscitate: Secondary | ICD-10-CM | POA: Diagnosis present

## 2023-07-28 DIAGNOSIS — R7989 Other specified abnormal findings of blood chemistry: Secondary | ICD-10-CM | POA: Diagnosis not present

## 2023-07-28 DIAGNOSIS — Z880 Allergy status to penicillin: Secondary | ICD-10-CM | POA: Diagnosis not present

## 2023-07-28 LAB — COMPREHENSIVE METABOLIC PANEL WITH GFR
ALT: 77 U/L — ABNORMAL HIGH (ref 0–44)
AST: 33 U/L (ref 15–41)
Albumin: 2.7 g/dL — ABNORMAL LOW (ref 3.5–5.0)
Alkaline Phosphatase: 72 U/L (ref 38–126)
Anion gap: 8 (ref 5–15)
BUN: 26 mg/dL — ABNORMAL HIGH (ref 8–23)
CO2: 30 mmol/L (ref 22–32)
Calcium: 8.8 mg/dL — ABNORMAL LOW (ref 8.9–10.3)
Chloride: 101 mmol/L (ref 98–111)
Creatinine, Ser: 0.95 mg/dL (ref 0.61–1.24)
GFR, Estimated: >60 mL/min (ref >60)
Glucose, Bld: 141 mg/dL — ABNORMAL HIGH (ref 70–99)
Potassium: 3.7 mmol/L (ref 3.5–5.1)
Sodium: 139 mmol/L (ref 135–145)
Total Bilirubin: 0.8 mg/dL (ref 0.0–1.2)
Total Protein: 6 g/dL — ABNORMAL LOW (ref 6.5–8.1)

## 2023-07-28 LAB — CBC
HCT: 38 % — ABNORMAL LOW (ref 39.0–52.0)
Hemoglobin: 11.9 g/dL — ABNORMAL LOW (ref 13.0–17.0)
MCH: 29.8 pg (ref 26.0–34.0)
MCHC: 31.3 g/dL (ref 30.0–36.0)
MCV: 95.2 fL (ref 80.0–100.0)
Platelets: 413 10*3/uL — ABNORMAL HIGH (ref 150–400)
RBC: 3.99 MIL/uL — ABNORMAL LOW (ref 4.22–5.81)
RDW: 14.9 % (ref 11.5–15.5)
WBC: 16.7 10*3/uL — ABNORMAL HIGH (ref 4.0–10.5)
nRBC: 0 % (ref 0.0–0.2)

## 2023-07-28 LAB — ECHOCARDIOGRAM COMPLETE
AR max vel: 3.14 cm2
AV Area VTI: 3.47 cm2
AV Area mean vel: 3.16 cm2
AV Mean grad: 8 mmHg
AV Peak grad: 15.8 mmHg
Ao pk vel: 1.99 m/s
Area-P 1/2: 4.06 cm2
Height: 67 in
S' Lateral: 3.4 cm
Weight: 4222.25 [oz_av]

## 2023-07-28 LAB — MAGNESIUM: Magnesium: 2.1 mg/dL (ref 1.7–2.4)

## 2023-07-28 LAB — PROCALCITONIN: Procalcitonin: 0.11 ng/mL

## 2023-07-28 LAB — PHOSPHORUS: Phosphorus: 4.5 mg/dL (ref 2.5–4.6)

## 2023-07-28 MED ORDER — MELATONIN 3 MG PO TABS: 3.0000 mg | ORAL_TABLET | Freq: Every day | ORAL | Status: DC

## 2023-07-28 MED ORDER — FUROSEMIDE 10 MG/ML IJ SOLN
80.0000 mg | Freq: Two times a day (BID) | INTRAMUSCULAR | Status: DC
  Administered 2023-07-28 – 2023-07-29 (×3): 80 mg via INTRAVENOUS
  Filled 2023-07-28 (×3): qty 8

## 2023-07-28 MED ORDER — SODIUM CHLORIDE 0.9 % IV SOLN
2.0000 g | INTRAVENOUS | Status: DC
  Administered 2023-07-28 – 2023-07-29 (×2): 2 g via INTRAVENOUS
  Filled 2023-07-28 (×2): qty 20

## 2023-07-28 MED ORDER — MELATONIN 5 MG PO TABS
5.0000 mg | ORAL_TABLET | Freq: Every evening | ORAL | Status: DC | PRN
  Administered 2023-07-28: 5 mg via ORAL
  Filled 2023-07-28: qty 1

## 2023-07-28 MED ORDER — IBUPROFEN 200 MG PO TABS
400.0000 mg | ORAL_TABLET | ORAL | Status: AC
  Administered 2023-07-28: 400 mg via ORAL
  Filled 2023-07-28: qty 2

## 2023-07-28 NOTE — Progress Notes (Signed)
 Attempted lower extremity duplex, however patient is with physical therapy. Will attempt again as schedule permits.  07/28/2023 9:06 AM Charlton Cooler., MHA, RVT, RDCS, RDMS

## 2023-07-28 NOTE — Care Management Obs Status (Signed)
 MEDICARE OBSERVATION STATUS NOTIFICATION   Patient Details  Name: Antuan Schwed MRN: [id number] Date of Birth: 03/20/1951   Medicare Observation Status Notification Given:  Yes    Lainy Wrobleski G., RN 07/28/2023, 8:59 AM

## 2023-07-28 NOTE — Evaluation (Signed)
 Physical Therapy Evaluation Patient Details Name: [name] MRN: [id number] DOB: [birth year] Today's Date: 07/28/2023  History of Present Illness  The pt is a 73 yo male presenting 5/2 with SOB and possible allergic reaction (rash and swelling). 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 PMH includes: arthritis s/p bilateral TKA, depression, HTN, morbid obesity (BMI 41), BPH, OSA, and recent gallbladder surgery on 4/21.   Clinical Impression  Pt in bed upon arrival of PT, agreeable to evaluation at this time. Prior to admission the pt was completely independent with all mobility, living with his spouse in a home with 8 steps to enter and a flight of stairs to his bedroom/bathroom. The pt was able to demo good independence with all transfers and mobility, good stability without use of DME. He does demo 2/4 DOE with exertion such as stairs or increased walking speed. SpO2 >91% throughout (low after stairs of 92%), but largely 93-96% with gait. The pt was educated on progressive walking program, increased activity, and safe exercises to improve general endurance. The pt and his family were agreeable, No further acute PT needs identified at this time.       If plan is discharge home, recommend the following: Help with stairs or ramp for entrance   Can travel by private vehicle        Equipment Recommendations None recommended by PT  Recommendations for Other Services       Functional Status Assessment Patient has had a recent decline in their functional status and demonstrates the ability to make significant improvements in function in a reasonable and predictable amount of time.     Precautions / Restrictions Precautions Precautions: None Recall of Precautions/Restrictions: Intact Restrictions Weight Bearing Restrictions Per Provider Order: No      Mobility  Bed Mobility Overal bed mobility: Independent                  Transfers Overall transfer level:  Independent Equipment used: None                    Ambulation/Gait Ambulation/Gait assistance: Supervision Gait Distance (Feet): 300 Feet Assistive device: None Gait Pattern/deviations: Step-through pattern Gait velocity: decreased Gait velocity interpretation: <1.31 ft/sec, indicative of household ambulator   General Gait Details: generally stable, SOB with exertion but SpO2 stable on RA >93% with gait. tolerated speed intervals with HR max 104bpm.  Stairs Stairs: Yes Stairs assistance: Supervision Stair Management: Two rails, Alternating pattern, Forwards Number of Stairs: 10 General stair comments: SpO2 to 92% after stairs, recovered to 96% with standing rest of ~5 seconds      Balance Overall balance assessment: Independent                                           Pertinent Vitals/Pain Pain Assessment Pain Assessment: No/denies pain    Home Living Family/patient expects to be discharged to:: Private residence Living Arrangements: Spouse/significant other Available Help at Discharge: Family;Available 24 hours/day Type of Home: House Home Access: Stairs to enter Entrance Stairs-Rails: Right;Left;Can reach both Entrance Stairs-Number of Steps: 8 Alternate Level Stairs-Number of Steps: flight, 7 + 7 Home Layout: Two level;Bed/bath upstairs Home Equipment: Shower seat - built Char[employer] fundraiser (2 wheels);Grab bars - tub/shower;Hand held shower head      Prior Function Prior Level of Function : Independent/Modified Independent;Driving  Mobility Comments: independent, no exercise or acticvity ADLs Comments: independent, difficulty with LB dressing     Extremity/Trunk Assessment   Upper Extremity Assessment Upper Extremity Assessment: Overall WFL for tasks assessed    Lower Extremity Assessment Lower Extremity Assessment: Overall WFL for tasks assessed (BLE edema, but sensation intact and strength grossly 4+/5 to MMT)     Cervical / Trunk Assessment Cervical / Trunk Assessment: Other exceptions Cervical / Trunk Exceptions: abdominal distention  Communication   Communication Communication: No apparent difficulties    Cognition Arousal: Alert Behavior During Therapy: WFL for tasks assessed/performed   PT - Cognitive impairments: No apparent impairments                         Following commands: Intact       Cueing Cueing Techniques: Verbal cues     General Comments General comments (skin integrity, edema, etc.): VSS on RA, slight drop with stairs but SpO2 >91% throughout and recovered to 96% with standing rest    Exercises Other Exercises Other Exercises: educated pt on increasing activity at home such as: sit-stand from EOB x30 sec, increased ambulation endurance, then add speed intervals for intensity   Assessment/Plan    PT Assessment Patient does not need any further PT services         PT Goals (Current goals can be found in the Care Plan section)  Acute Rehab PT Goals Patient Stated Goal: return to independence PT Goal Formulation: All assessment and education complete, DC therapy Time For Goal Achievement: 08/11/23 Potential to Achieve Goals: Good     AM-PAC PT "6 Clicks" Mobility  Outcome Measure Help needed turning from your back to your side while in a flat bed without using bedrails?: None Help needed moving from lying on your back to sitting on the side of a flat bed without using bedrails?: None Help needed moving to and from a bed to a chair (including a wheelchair)?: None Help needed standing up from a chair using your arms (e.g., wheelchair or bedside chair)?: None Help needed to walk in hospital room?: A Little Help needed climbing 3-5 steps with a railing? : A Little 6 Click Score: 22    End of Session Equipment Utilized During Treatment: Gait belt Activity Tolerance: Patient tolerated treatment well Patient left: in bed;with call bell/phone within  reach;with family/visitor present Nurse Communication: Mobility status PT Visit Diagnosis: Other abnormalities of gait and mobility (R26.89)    Time: 1610-9604 PT Time Calculation (min) (ACUTE ONLY): 32 min   Charges:   PT Evaluation $PT Eval Moderate Complexity: 1 Mod PT Treatments $Therapeutic Exercise: 8-22 mins PT General Charges $$ ACUTE PT VISIT: 1 Visit         Barnabas Booth, PT, DPT   Acute Rehabilitation Department Office [phone] Secure Chat Communication Preferred  Lona Rist 07/28/2023, 10:38 AM

## 2023-07-28 NOTE — Progress Notes (Signed)
 PROGRESS NOTE  [name] ION:[id number] DOB: 07/09/1950   PCP: Cornelio Dike, MD  Patient is from: Home.  Lives with wife.  Independently ambulates at baseline.  DOA: 07/27/2023 LOS: 0  Chief complaints Chief Complaint  Patient presents with   Shortness of Breath   Allergic Reaction     Brief Narrative / Interim history: 73 y.o. male with PMH of right TKR, HTN, morbid obesity, BPH, OSA not on CPAP, morbid obesity and recent gallbladder surgery on 4/21 presenting with shortness of breath, orthopnea, abdominal swelling, lower extremity edema and redness, and admitted with working diagnosis of respiratory distress due to possible acute CHF and HCAP. Patient had cholecystectomy at [facility] on 4/21.  He was discharged on amoxicillin although he was allergic to penicillin.  Seen in ED at [facility] on 4/28 with complaints of shortness of breath, bilateral hand swelling and hives.   He was treated with steroid, Benadryl .  Augmentin discontinued and he was discharged on p.o. Cipro.   In ED, slightly hypertensive.  WBC 15.7 with left shift.  BNP 237.  D-dimer 4.82.  CT angio chest negative for PE but possible RLL atelectasis and liver cirrhosis. Received IV Solu-Medrol , vancomycin  and cefepime .  Admission requested for HCAP.  Patient was started on IV Lasix .  Echocardiogram, RUQ US  and LE venous Doppler ordered.  Subjective: Seen and examined earlier this morning.  No major events overnight of this morning.  Reports improvement in his breathing but still with significant swelling.  BLE erythema improving.  Patient's wife at bedside  Objective: Vitals:   07/27/23 2354 07/28/23 0435 07/28/23 0838 07/28/23 1212  BP: (!) 152/92 (!) 156/92 (!) 164/96 134/80  Pulse: 72 (!) 59 81 66  Resp: 17 18 18 18   Temp: 98.9 F (37.2 C)  97.9 F (36.6 C) 98.3 F (36.8 C)  TempSrc: Oral  Oral Oral  SpO2: 95% 97% 97% 94%  Weight:      Height:        Examination:  GENERAL: No apparent distress.   Nontoxic. HEENT: MMM.  Vision and hearing grossly intact.  NECK: Supple.  No apparent JVD but difficult to assess due to body habitus.  RESP:  No IWOB.  Fair aeration bilaterally.  Bibasilar rales. CVS:  RRR. Heart sounds normal.  ABD/GI/GU: BS+. Abd soft, NTND.  MSK/EXT:  Moves extremities. No apparent deformity.  2+ BLE edema. SKIN: B LE erythema with some warmth to touch. NEURO: Awake, alert and oriented appropriately.  No apparent focal neuro deficit. PSYCH: Calm. Normal affect.   Consultants:  None  Procedures: None  Microbiology summarized: MRSA PCR screen nonreactive.  Assessment and plan: Respiratory distress: Presents with SOB, orthopnea, edema, abdominal distention/fullness.  Concerning for new acute CHF/possible RV failure but TTE without significant finding other than moderate LVH.  No pulmonary edema on CXR.  BNP 237.  CT angio negative for PE but possible RLL opacity.   No prior history of CHF. Has significant leukocytosis.  Initially started on vancomycin , cefepime , Solu-Medrol  and Lasix .  Respiratory symptoms improved.  Still with significant BLE edema.  Pro-Cal improved. -De-escalate antibiotics to ceftriaxone -Continue IV Lasix  80 mg twice daily -Strict intake and output, daily weight, renal functions and electrolytes  Possible pneumonia: Possible RLL infiltrate on imaging.  Respiratory distress, fever and leukocytosis - Antibiotics as above  NASH cirrhosis/abdominal distention/elevated liver enzymes: CT angio and RUQ US  suggested liver cirrhosis.  Does not drink alcohol. -Diuretics as above   Drug reaction: Patient with penicillin allergy history and  discharged from [facility] after a cholecystectomy on Augmentin.  Seen in ED at [facility] and treated.  Still with significant BLE erythema.  Unclear if this is drug reaction, cellulitis or venous insufficiency.  Received IV Solu-Medrol  in ED. -Hold off more steroid at this time -Antibiotics and diuretics and diuretics as  above  BLE edema: Significant BLE edema.  No asymmetry.  Due to liver cirrhosis?  TSH normal.  Albumin 2.6.  Hgb 11.5.   -Diuretics as above -Leg elevation -Lower extremity venous Doppler -TED hose if lower extremity Doppler negative.   Essential hypertension: Seems to be on Coreg , [place] , losartan  at home.  BP slightly elevated here -Continue home Coreg  and losartan  -Diuretics as above   OSA not on CPAP-intolerant   BPH without LUTS:  - Resume home Proscar   Prediabetes: A1c 6.0% -Monitor glucose with daily labs   Morbid obesity Body mass index is 41.33 kg/m. - Encourage lifestyle change to lose weight          DVT prophylaxis:  On Lovenox .  Code Status: DNR-Limited Family Communication: Updated patient's wife at bedside Level of care: Telemetry Medical Status is: Observation The patient will require care spanning > 2 midnights and should be moved to inpatient because: Respiratory distress, pneumonia, volume overload with significant BLE edema   Final disposition: Home   55 minutes with more than 50% spent in reviewing records, counseling patient/family and coordinating care.   Sch Meds:  Scheduled Meds:  carvedilol   25 mg Oral BID WC   enoxaparin  (LOVENOX ) injection  0.5 mg/kg Subcutaneous Q24H   furosemide   80 mg Intravenous BID   losartan   50 mg Oral Daily   Continuous Infusions:  ceFEPime  (MAXIPIME ) IV 2 g (07/28/23 0608)   vancomycin      PRN Meds:.acetaminophen  **OR** acetaminophen , melatonin, ondansetron  **OR** ondansetron  (ZOFRAN ) IV, polyethylene glycol, senna-docusate  Antimicrobials: Anti-infectives (From admission, onward)    Start     Dose/Rate Route Frequency Ordered Stop   07/28/23 1700  vancomycin  (VANCOREADY) IVPB 1750 mg/350 mL        1,750 mg 175 mL/hr over 120 Minutes Intravenous Every 24 hours 07/27/23 1929     07/27/23 2300  ceFEPIme  (MAXIPIME ) 2 g in sodium chloride  0.9 % 100 mL IVPB        2 g 200 mL/hr over 30 Minutes  Intravenous Every 8 hours 07/27/23 1824     07/27/23 1500  ceFEPIme  (MAXIPIME ) 2 g in sodium chloride  0.9 % 100 mL IVPB        2 g 200 mL/hr over 30 Minutes Intravenous  Once 07/27/23 1445 07/27/23 1555   07/27/23 1500  vancomycin  (VANCOCIN ) IVPB 1000 mg/200 mL premix  Status:  Discontinued        1,000 mg 200 mL/hr over 60 Minutes Intravenous  Once 07/27/23 1445 07/27/23 1453   07/27/23 1500  vancomycin  (VANCOREADY) IVPB 2000 mg/400 mL        2,000 mg 200 mL/hr over 120 Minutes Intravenous  Once 07/27/23 1453 07/27/23 1838        I have personally reviewed the following labs and images: CBC: Recent Labs  Lab 07/27/23 0958 07/28/23 0705  WBC 15.7* 16.7*  NEUTROABS 10.9*  --   HGB 11.5* 11.9*  HCT 36.7* 38.0*  MCV 96.6 95.2  PLT 398 413*   BMP &GFR Recent Labs  Lab 07/27/23 0958 07/28/23 0705  NA 143 139  K 3.7 3.7  CL 104 101  CO2 28 30  GLUCOSE 104* 141*  BUN  22 26*  CREATININE 0.95 0.95  CALCIUM 8.9 8.8*  MG  --  2.1  PHOS  --  4.5   Estimated Creatinine Clearance: 89.5 mL/min (by C-G formula based on SCr of 0.95 mg/dL). Liver & Pancreas: Recent Labs  Lab 07/27/23 0958 07/28/23 0705  AST 61* 33  ALT 91* 77*  ALKPHOS 59 72  BILITOT 0.7 0.8  PROT 5.6* 6.0*  ALBUMIN 2.6* 2.7*   Recent Labs  Lab 07/27/23 0958  LIPASE 22   No results for input(s): "AMMONIA" in the last 168 hours. Diabetic: Recent Labs    07/27/23 2028  HGBA1C 6.0*   No results for input(s): "GLUCAP" in the last 168 hours. Cardiac Enzymes: No results for input(s): "CKTOTAL", "CKMB", "CKMBINDEX", "TROPONINI" in the last 168 hours. No results for input(s): "PROBNP" in the last 8760 hours. Coagulation Profile: No results for input(s): "INR", "PROTIME" in the last 168 hours. Thyroid  Function Tests: Recent Labs    07/27/23 1138  TSH 1.709   Lipid Profile: No results for input(s): "CHOL", "HDL", "LDLCALC", "TRIG", "CHOLHDL", "LDLDIRECT" in the last 72 hours. Anemia Panel: No  results for input(s): "VITAMINB12", "FOLATE", "FERRITIN", "TIBC", "IRON", "RETICCTPCT" in the last 72 hours. Urine analysis: No results found for: "COLORURINE", "APPEARANCEUR", "LABSPEC", "PHURINE", "GLUCOSEU", "HGBUR", "BILIRUBINUR", "KETONESUR", "PROTEINUR", "UROBILINOGEN", "NITRITE", "LEUKOCYTESUR" Sepsis Labs: Invalid input(s): "PROCALCITONIN", "LACTICIDVEN"  Microbiology: Recent Results (from the past 240 hours)  MRSA Next Gen by PCR, Nasal     Status: None   Collection Time: 07/27/23  6:14 PM   Specimen: Nasal Mucosa; Nasal Swab  Result Value Ref Range Status   MRSA by PCR Next Gen NOT DETECTED NOT DETECTED Final    Comment: (NOTE) The GeneXpert MRSA Assay (FDA approved for NASAL specimens only), is one component of a comprehensive MRSA colonization surveillance program. It is not intended to diagnose MRSA infection nor to guide or monitor treatment for MRSA infections. Test performance is not FDA approved in patients less than 61 years old. Performed at [facility], [address], [place], [place] [address]     Radiology Studies: ECHOCARDIOGRAM COMPLETE Result Date: 07/28/2023    ECHOCARDIOGRAM REPORT   Patient Name:   [name] Date of Exam: 07/28/2023 Medical Rec #:  [id number]       Height:       67.0 in Accession #:    [id number]      Weight:       263.9 lb Date of Birth:  06-28-1950       BSA:          2.275 m Patient Age:    72 years        BP:           156/92 mmHg Patient Gender: M               HR:           63 bpm. Exam Location:  Inpatient Procedure: 2D Echo, 3D Echo, Color Doppler, Cardiac Doppler and Strain Analysis            (Both Spectral and Color Flow Doppler were utilized during            procedure). Indications:    Other abnormalities of the heart  History:        Patient has no prior history of Echocardiogram examinations.                 Risk Factors:Hypertension.  Sonographer:    Charm Coombs  [facility] RDCS Referring Phys: [id number] Ella Gun Kyah Buesing IMPRESSIONS  1.  Left ventricular ejection fraction, by estimation, is 55 to 60%. The left ventricle has normal function. The left ventricle has no regional wall motion abnormalities. There is moderate left ventricular hypertrophy. Left ventricular diastolic parameters were normal. The average left ventricular global longitudinal strain is -18.8 %. The global longitudinal strain is normal.  2. Right ventricular systolic function is normal. The right ventricular size is normal.  3. The mitral valve is abnormal. No evidence of mitral valve regurgitation. No evidence of mitral stenosis.  4. The aortic valve is tricuspid. There is mild calcification of the aortic valve. There is mild thickening of the aortic valve. Aortic valve regurgitation is mild. Aortic valve sclerosis is present, with no evidence of aortic valve stenosis.  5. The inferior vena cava is dilated in size with >50% respiratory variability, suggesting right atrial pressure of 8 mmHg. FINDINGS  Left Ventricle: Left ventricular ejection fraction, by estimation, is 55 to 60%. The left ventricle has normal function. The left ventricle has no regional wall motion abnormalities. The average left ventricular global longitudinal strain is -18.8 %. Strain was performed and the global longitudinal strain is normal. The left ventricular internal cavity size was normal in size. There is moderate left ventricular hypertrophy. Left ventricular diastolic parameters were normal. Right Ventricle: The right ventricular size is normal. No increase in right ventricular wall thickness. Right ventricular systolic function is normal. Left Atrium: Left atrial size was normal in size. Right Atrium: Right atrial size was normal in size. Pericardium: Trivial pericardial effusion is present. The pericardial effusion is posterior to the left ventricle. Mitral Valve: The mitral valve is abnormal. There is mild thickening of the mitral valve leaflet(s). There is mild calcification of the mitral valve  leaflet(s). No evidence of mitral valve regurgitation. No evidence of mitral valve stenosis. Tricuspid Valve: The tricuspid valve is normal in structure. Tricuspid valve regurgitation is not demonstrated. No evidence of tricuspid stenosis. Aortic Valve: The aortic valve is tricuspid. There is mild calcification of the aortic valve. There is mild thickening of the aortic valve. Aortic valve regurgitation is mild. Aortic valve sclerosis is present, with no evidence of aortic valve stenosis. Aortic valve mean gradient measures 8.0 mmHg. Aortic valve peak gradient measures 15.8 mmHg. Aortic valve area, by VTI measures 3.47 cm. Pulmonic Valve: The pulmonic valve was normal in structure. Pulmonic valve regurgitation is not visualized. No evidence of pulmonic stenosis. Aorta: The aortic root is normal in size and structure. Venous: The inferior vena cava is dilated in size with greater than 50% respiratory variability, suggesting right atrial pressure of 8 mmHg. IAS/Shunts: The interatrial septum appears to be lipomatous. No atrial level shunt detected by color flow Doppler. Additional Comments: 3D was performed not requiring image post processing on an independent workstation and was normal.  LEFT VENTRICLE PLAX 2D LVIDd:         5.20 cm   Diastology LVIDs:         3.40 cm   LV e' medial:    17.50 cm/s LV PW:         1.20 cm   LV E/e' medial:  8.5 LV IVS:        1.40 cm   LV e' lateral:   23.20 cm/s LVOT diam:     2.40 cm   LV E/e' lateral: 6.4 LV SV:         124 LV SV Index:   54  2D Longitudinal Strain LVOT Area:     4.52 cm  2D Strain GLS Avg:     -18.8 %                           3D Volume EF:                          3D EF:        55 %                          LV EDV:       250 ml                          LV ESV:       112 ml                          LV SV:        138 ml RIGHT VENTRICLE             IVC RV Basal diam:  3.70 cm     IVC diam: 2.50 cm RV S prime:     19.70 cm/s TAPSE (M-mode): 3.5 cm LEFT ATRIUM              Index        RIGHT ATRIUM           Index LA diam:        3.10 cm 1.36 cm/m   RA Area:     20.90 cm LA Vol (A2C):   73.2 ml 32.17 ml/m  RA Volume:   61.00 ml  26.81 ml/m LA Vol (A4C):   70.9 ml 31.16 ml/m LA Biplane Vol: 71.9 ml 31.60 ml/m  AORTIC VALVE AV Area (Vmax):    3.14 cm AV Area (Vmean):   3.16 cm AV Area (VTI):     3.47 cm AV Vmax:           199.00 cm/s AV Vmean:          132.000 cm/s AV VTI:            0.357 m AV Peak Grad:      15.8 mmHg AV Mean Grad:      8.0 mmHg LVOT Vmax:         138.00 cm/s LVOT Vmean:        92.100 cm/s LVOT VTI:          0.274 m LVOT/AV VTI ratio: 0.77  AORTA Ao Root diam: 3.00 cm Ao Asc diam:  3.60 cm MITRAL VALVE MV Area (PHT): 4.06 cm     SHUNTS MV Decel Time: 187 msec     Systemic VTI:  0.27 m MV E velocity: 149.00 cm/s  Systemic Diam: 2.40 cm MV A velocity: 134.00 cm/s MV E/A ratio:  1.11 Janelle Mediate MD Electronically signed by Janelle Mediate MD Signature Date/Time: 07/28/2023/11:34:19 AM    Final    US  Abdomen Limited Result Date: 07/27/2023 CLINICAL DATA:  Cirrhosis EXAM: ULTRASOUND ABDOMEN LIMITED RIGHT UPPER QUADRANT COMPARISON:  Chest CT today. FINDINGS: Gallbladder: Post cholecystectomy. Small fluid collection in the gallbladder fossa measures 3.2 x 2.1 x 2.4 cm, likely related to recent cholecystectomy. Common bile duct: Diameter: Normal caliber, 6 mm. Liver: Subtle nodularity to the liver surface suggesting cirrhosis. No  focal hepatic abnormality. Portal vein is patent on color Doppler imaging with normal direction of blood flow towards the liver. Other: None. IMPRESSION: Prior recent cholecystectomy. Small fluid collection measuring up to 3.2 cm, likely postoperative fluid collection. Subtle nodularity to the liver surface suggests cirrhosis. Electronically Signed   By: Janeece Mechanic M.D.   On: 07/27/2023 19:34      Audi Wettstein T. Negar Sieler [facility]  If 7PM-7AM, please contact night-coverage [email or web address] 07/28/2023, 1:43 PM

## 2023-07-28 NOTE — Progress Notes (Signed)
 Echocardiogram 2D Echocardiogram has been performed.  Genova Kiner N Zayli Villafuerte,RDCS 07/28/2023, 10:15 AM

## 2023-07-28 NOTE — Plan of Care (Signed)

## 2023-07-28 NOTE — Progress Notes (Signed)
 OT Cancellation Note  Patient Details Name: [name] MRN: [id number] DOB: Jun 17, 1950   Cancelled Treatment:    Reason Eval/Treat Not Completed: OT screened, no needs identified, will sign off (per discussion with PT, pt with no acute OT needs at this time, will screen. Please reconsult if there is a change in pt status.)  Coraima Tibbs K, OTD, OTR/L SecureChat Preferred Acute Rehab [phone]   Antionette Kirks 07/28/2023, 10:44 AM

## 2023-07-29 ENCOUNTER — Inpatient Hospital Stay (HOSPITAL_COMMUNITY)

## 2023-07-29 DIAGNOSIS — R0602 Shortness of breath: Secondary | ICD-10-CM | POA: Diagnosis not present

## 2023-07-29 DIAGNOSIS — R0603 Acute respiratory distress: Secondary | ICD-10-CM | POA: Diagnosis not present

## 2023-07-29 DIAGNOSIS — G4730 Sleep apnea, unspecified: Secondary | ICD-10-CM | POA: Diagnosis not present

## 2023-07-29 DIAGNOSIS — R7989 Other specified abnormal findings of blood chemistry: Secondary | ICD-10-CM

## 2023-07-29 DIAGNOSIS — R600 Localized edema: Secondary | ICD-10-CM

## 2023-07-29 LAB — RENAL FUNCTION PANEL
Albumin: 2.5 g/dL — ABNORMAL LOW (ref 3.5–5.0)
Albumin: 2.8 g/dL — ABNORMAL LOW (ref 3.5–5.0)
Anion gap: 11 (ref 5–15)
Anion gap: 10 (ref 5–15)
BUN: 29 mg/dL — ABNORMAL HIGH (ref 8–23)
BUN: 28 mg/dL — ABNORMAL HIGH (ref 8–23)
CO2: 31 mmol/L (ref 22–32)
CO2: 32 mmol/L (ref 22–32)
Calcium: 8.9 mg/dL (ref 8.9–10.3)
Calcium: 9 mg/dL (ref 8.9–10.3)
Chloride: 101 mmol/L (ref 98–111)
Chloride: 100 mmol/L (ref 98–111)
Creatinine, Ser: 0.92 mg/dL (ref 0.61–1.24)
Creatinine, Ser: 0.88 mg/dL (ref 0.61–1.24)
GFR, Estimated: >60 mL/min (ref >60)
GFR, Estimated: >60 mL/min (ref >60)
Glucose, Bld: 96 mg/dL (ref 70–99)
Glucose, Bld: 138 mg/dL — ABNORMAL HIGH (ref 70–99)
Phosphorus: 3.7 mg/dL (ref 2.5–4.6)
Phosphorus: 3.2 mg/dL (ref 2.5–4.6)
Potassium: 3 mmol/L — ABNORMAL LOW (ref 3.5–5.1)
Potassium: 3.5 mmol/L (ref 3.5–5.1)
Sodium: 143 mmol/L (ref 135–145)
Sodium: 142 mmol/L (ref 135–145)

## 2023-07-29 LAB — CBC
HCT: 35.2 % — ABNORMAL LOW (ref 39.0–52.0)
Hemoglobin: 11.5 g/dL — ABNORMAL LOW (ref 13.0–17.0)
MCH: 30.6 pg (ref 26.0–34.0)
MCHC: 32.7 g/dL (ref 30.0–36.0)
MCV: 93.6 fL (ref 80.0–100.0)
Platelets: 413 10*3/uL — ABNORMAL HIGH (ref 150–400)
RBC: 3.76 MIL/uL — ABNORMAL LOW (ref 4.22–5.81)
RDW: 14.8 % (ref 11.5–15.5)
WBC: 16.1 10*3/uL — ABNORMAL HIGH (ref 4.0–10.5)
nRBC: 0 % (ref 0.0–0.2)

## 2023-07-29 LAB — MAGNESIUM: Magnesium: 2 mg/dL (ref 1.7–2.4)

## 2023-07-29 LAB — PROCALCITONIN: Procalcitonin: <0.1 ng/mL

## 2023-07-29 MED ORDER — SENNOSIDES-DOCUSATE SODIUM 8.6-50 MG PO TABS
2.0000 | ORAL_TABLET | Freq: Two times a day (BID) | ORAL | Status: DC | PRN
Start: 2023-07-29 — End: 2023-10-17

## 2023-07-29 MED ORDER — POTASSIUM CHLORIDE CRYS ER 20 MEQ PO TBCR
40.0000 meq | EXTENDED_RELEASE_TABLET | ORAL | Status: AC
  Administered 2023-07-29 (×3): 40 meq via ORAL
  Filled 2023-07-29 (×3): qty 2

## 2023-07-29 MED ORDER — POTASSIUM CHLORIDE CRYS ER 20 MEQ PO TBCR: 40.0000 meq | EXTENDED_RELEASE_TABLET | Freq: Every day | ORAL | 0 refills | Status: DC

## 2023-07-29 MED ORDER — DOXYCYCLINE HYCLATE 100 MG PO CAPS: 100.0000 mg | ORAL_CAPSULE | Freq: Two times a day (BID) | ORAL | 0 refills | Status: AC

## 2023-07-29 MED ORDER — TRAZODONE HCL 50 MG PO TABS
50.0000 mg | ORAL_TABLET | Freq: Every day | ORAL | Status: DC
  Administered 2023-07-29: 50 mg via ORAL
  Filled 2023-07-29: qty 1

## 2023-07-29 MED ORDER — TRAZODONE HCL 50 MG PO TABS
50.0000 mg | ORAL_TABLET | Freq: Once | ORAL | Status: DC
  Filled 2023-07-29: qty 1

## 2023-07-29 MED ORDER — EPLERENONE 25 MG PO TABS: 25.0000 mg | ORAL_TABLET | Freq: Every day | ORAL | 0 refills | Status: DC

## 2023-07-29 MED ORDER — TORSEMIDE 20 MG PO TABS: 40.0000 mg | ORAL_TABLET | Freq: Every day | ORAL | 0 refills | Status: DC

## 2023-07-29 MED ORDER — LOSARTAN POTASSIUM 50 MG PO TABS: 50.0000 mg | ORAL_TABLET | Freq: Every day | ORAL | 1 refills | Status: DC

## 2023-07-29 NOTE — Discharge Summary (Addendum)
 Physician Discharge Summary  [name] WJX:[id number] DOB: 08-02-1950 DOA: 07/27/2023  PCP: Cornelio Dike, MD  Admit date: 07/27/2023 Discharge date: 07/29/23  Admitted From: Home Disposition: Home Recommendations for Outpatient Follow-up:  Follow up with PCP in 1 week Check blood pressure, fluid status, CMP and CBC at follow-up Recommend referral to hepatologist or GI for liver cirrhosis Please follow up on the following pending results: None  Home Health: No need identified Equipment/Devices: No need identified  Discharge Condition: Stable CODE STATUS: DNR-Limited  Follow-up Information     Cornelio Dike, MD. Schedule an appointment as soon as possible for a visit in 1 week(s).   Specialty: Internal Medicine Contact information: [address] [place] [address] [phone]                 Hospital course 73 y.o. male with PMH of right TKR, HTN, morbid obesity, BPH, OSA not on CPAP, morbid obesity and recent gallbladder surgery on 4/21 presenting with shortness of breath, orthopnea, abdominal swelling, lower extremity edema and redness, and admitted with working diagnosis of respiratory distress due to possible acute CHF and HCAP. Patient had cholecystectomy at [facility] on 4/21.  He was discharged on amoxicillin although he was allergic to penicillin.  Seen in ED at [facility] on 4/28 with complaints of shortness of breath, bilateral hand swelling and hives.   He was treated with steroid, Benadryl .  Augmentin discontinued and he was discharged on p.o. Cipro.    In ED, slightly hypertensive.  WBC 15.7 with left shift.  BNP 237.  D-dimer 4.82.  CT angio chest negative for PE but possible RLL atelectasis and liver cirrhosis. Received IV Solu-Medrol , vancomycin  and cefepime .  Admission requested for possible HCAP and drug reaction.  He appeared fluid overloaded on exam with significant BLE edema.    TTE with LVEF of 55 to 60%, moderate LVH, normal diastolic  function and RVSF.  Abdominal ultrasound with prior cholecystectomy with small postoperative fluid collection and features suggesting liver cirrhosis.  Lower extremity venous Doppler negative for DVT.   Patient diuresed with IV Lasix  80 mg twice daily with significant improvement in his symptoms.  Net -4 L.  He is discharged on p.o. torsemide 40 mg daily and eplerenone 25 mg daily.  Discontinued Hygroton  (not taking prior to admission).  Advised to use compression socks and elevate legs.   In regards to liver cirrhosis, recommend referral to hepatologist/gastroenterologist.   See individual problem list below for more.   Problems addressed during this hospitalization Respiratory distress: Presents with SOB, orthopnea, edema, abdominal distention/fullness.  Concerning for new acute CHF/possible RV failure but TTE without significant finding other than moderate LVH.  No pulmonary edema on CXR.  BNP 237.  CT angio negative for PE but possible RLL opacity.   No prior history of CHF. Has significant leukocytosis.  Initially started on vancomycin , cefepime , Solu-Medrol  and Lasix .  Respiratory symptoms resolved.  BLE edema improved.  -Cefepime  and vancomycin  5/2> ceftriaxone 5/3> doxycycline 5/5-5/7 -P.o. torsemide 40 mg daily.  Eplerenone 25 mg daily and p.o. KCl 40 mill equivalent daily -Reassess fluid status and adjust meds as appropriate   Possible pneumonia: Possible RLL infiltrate on imaging.  Respiratory distress, fever and leukocytosis - Antibiotics as above   NASH cirrhosis/abdominal distention/elevated liver enzymes: CT angio and RUQ US  suggested liver cirrhosis.  Does not drink alcohol. -Diuretics as above -Recommend referral to hepatology   Drug reaction: Patient with penicillin allergy history and discharged from [facility] after a cholecystectomy on  Augmentin.  Seen in ED at [facility] and treated.  Still with significant BLE erythema.  Unclear if this is drug reaction, cellulitis or venous  insufficiency.  Received IV Solu-Medrol  in ED. seems to have resolved.   BLE edema: Significant BLE edema.  No asymmetry.  Due to liver cirrhosis?  TSH normal.  Albumin 2.6.  Hgb 11.5.  TTE as above.  Lower extremity venous Doppler negative for DVT. - Diuretics, compression socks and leg elevation   Essential hypertension: Seems to be on Coreg , Hygroton , losartan  at home.  BP slightly elevated here -Continue home Coreg   -Decreased home losartan  from 100 to 50 mg daily -Diuretics as above. -Discontinued Hygroton .  Not taking   OSA not on CPAP-intolerant   BPH without LUTS:  - Resume home Proscar    Prediabetes: A1c 6.0% -Monitor glucose with daily labs -Continue SGLT2 inhibitors outpatient  Hypokalemia: Resolved. -P.o. KCl 40 mill equivalent daily -Recheck in 1 week   Morbid obesity Body mass index is 42.29 kg/m. - Continue GLP-1 agonist outpatient - Lifestyle change to lose weight            Time spent 35 minutes  Vital signs Vitals:   07/29/23 0500 07/29/23 0900 07/29/23 1108 07/29/23 1624  BP:  (!) 156/77 (!) 154/64 (!) 146/75  Pulse:    (!) 57  Temp:  97.8 F (36.6 C)  99 F (37.2 C)  Resp:    18  Height:      Weight: 122.5 kg     SpO2:  96% 96% 94%  TempSrc:  Oral  Oral  BMI (Calculated): 42.28        Discharge exam  GENERAL: No apparent distress.  Nontoxic. HEENT: MMM.  Vision and hearing grossly intact.  NECK: Supple.  No apparent JVD.  RESP:  No IWOB.  Fair aeration bilaterally. CVS:  RRR. Heart sounds normal.  ABD/GI/GU: BS+. Abd soft, NTND.  MSK/EXT:  Moves extremities. No apparent deformity.  1+ BLE edema SKIN: BLE erythema.  No increased warmth to touch NEURO: Awake and alert. Oriented appropriately.  No apparent focal neuro deficit. PSYCH: Calm. Normal affect.   Discharge Instructions Discharge Instructions     Diet - low sodium heart healthy   Complete by: As directed    Discharge instructions   Complete by: As directed    It has  been a pleasure taking care of you!  You were hospitalized due to shortness of breath, leg swelling and redness.  Your symptoms improved with diuretics (fluid medication).  We have also started you on antibiotics for possible pneumonia.  We are discharging you on oral fluid medications and antibiotics.  Please take your medications as prescribed.  Reviewed your new medication list and the directions on your medications before you take them.   In addition to taking your medications as prescribed, we also recommend you avoid alcohol or over-the-counter pain medications, limit the amount of water /fluid you drink to less than 6 cups (1500 cc) a day,  limit your sodium (salt) intake to less than 2 g (2000 mg) a day and weigh yourself daily at the same time and keeping your weight log.  Elevate your legs.  Use compression socks to help with leg swelling.     Take care,   Increase activity slowly   Complete by: As directed       Allergies as of 07/29/2023       Reactions   Beta-lactamase Inhibitors (beta-lactam) Hives, Shortness Of Breath, Swelling   Amoxicillin,  ciprofloxacin   Spironolactone  Other (See Comments)   Gynecomastia    Lisinopril Swelling   Penicillins Hives   Amlodipine Swelling, Other (See Comments)        Medication List     STOP taking these medications    amoxicillin-clavulanate 875-125 MG tablet Commonly known as: AUGMENTIN   chlorthalidone  25 MG tablet Commonly known as: HYGROTON    ciprofloxacin 500 MG tablet Commonly known as: CIPRO   magnesium oxide 400 (240 Mg) MG tablet Commonly known as: MAG-OX   methylPREDNISolone  4 MG tablet Commonly known as: MEDROL        TAKE these medications    aspirin  EC 81 MG tablet Take 1 tablet (81 mg total) by mouth 2 (two) times daily.   carvedilol  25 MG tablet Commonly known as: COREG  Take 25 mg by mouth 2 (two) times daily with a meal.   doxycycline 100 MG capsule Commonly known as: VIBRAMYCIN Take 1 capsule  (100 mg total) by mouth 2 (two) times daily for 2 days. Start taking on: Jul 30, 2023   eplerenone 25 MG tablet Commonly known as: INSPRA Take 1 tablet (25 mg total) by mouth daily.   finasteride  5 MG tablet Commonly known as: PROSCAR  Take 5 mg by mouth daily.   HYDROcodone -acetaminophen  5-325 MG tablet Commonly known as: NORCO/VICODIN Take 1 tablet by mouth every 6 (six) hours as needed for moderate pain (pain score 4-6).   losartan  50 MG tablet Commonly known as: COZAAR  Take 1 tablet (50 mg total) by mouth daily. Start taking on: Jul 30, 2023 What changed:  medication strength how much to take   potassium chloride  SA 20 MEQ tablet Commonly known as: KLOR-CON  M Take 2 tablets (40 mEq total) by mouth daily.   senna-docusate 8.6-50 MG tablet Commonly known as: Senokot-S Take 2 tablets by mouth 2 (two) times daily between meals as needed for mild constipation.   torsemide 20 MG tablet Commonly known as: DEMADEX Take 2 tablets (40 mg total) by mouth daily.   traZODone  100 MG tablet Commonly known as: DESYREL  Take 100 mg by mouth at bedtime.        Consultations: None  Procedures/Studies:   VAS US  LOWER EXTREMITY VENOUS (DVT) Result Date: 07/29/2023  Lower Venous DVT Study Patient Name:  [name]  Date of Exam:   07/29/2023 Medical Rec #: [id number]        Accession #:    [id number] Date of Birth: [birth year]        Patient Gender: M Patient Age:   23 years Exam Location:  [facility] Procedure:      VAS US  LOWER EXTREMITY VENOUS (DVT) Referring Phys: Lorella Roles Zane Pellecchia --------------------------------------------------------------------------------  Indications: Elevated D-dimer (4.82).  Limitations: Poor ultrasound/tissue interface. Comparison Study: No previous exams Performing Technologist: Jody Hill RVT, RDMS  Examination Guidelines: A complete evaluation includes B-mode imaging, spectral Doppler, color Doppler, and power Doppler as needed of all accessible portions  of each vessel. Bilateral testing is considered an integral part of a complete examination. Limited examinations for reoccurring indications may be performed as noted. The reflux portion of the exam is performed with the patient in reverse Trendelenburg.  +--------+---------------+---------+-----------+----------+--------------------+ RIGHT   CompressibilityPhasicitySpontaneityPropertiesThrombus Aging       +--------+---------------+---------+-----------+----------+--------------------+ CFV     Full           Yes      Yes                                       +--------+---------------+---------+-----------+----------+--------------------+  SFJ     Full                                                              +--------+---------------+---------+-----------+----------+--------------------+ FV Prox Full           Yes      Yes                                       +--------+---------------+---------+-----------+----------+--------------------+ FV Mid  Full           Yes      Yes                                       +--------+---------------+---------+-----------+----------+--------------------+ FV      Full           Yes      Yes                                       Distal                                                                    +--------+---------------+---------+-----------+----------+--------------------+ PFV                    Yes      Yes                  patent by                                                                 color/doppler        +--------+---------------+---------+-----------+----------+--------------------+ POP     Full           Yes      Yes                                       +--------+---------------+---------+-----------+----------+--------------------+ PTV     Full                                                               +--------+---------------+---------+-----------+----------+--------------------+ PERO    Full                                                              +--------+---------------+---------+-----------+----------+--------------------+   +---------+---------------+---------+-----------+----------+--------------+  LEFT     CompressibilityPhasicitySpontaneityPropertiesThrombus Aging +---------+---------------+---------+-----------+----------+--------------+ CFV      Full           Yes      Yes                                 +---------+---------------+---------+-----------+----------+--------------+ SFJ      Full                                                        +---------+---------------+---------+-----------+----------+--------------+ FV Prox  Full           Yes      Yes                                 +---------+---------------+---------+-----------+----------+--------------+ FV Mid   Full           Yes      Yes                                 +---------+---------------+---------+-----------+----------+--------------+ FV DistalFull           Yes      Yes                                 +---------+---------------+---------+-----------+----------+--------------+ PFV      Full                                                        +---------+---------------+---------+-----------+----------+--------------+ POP      Full           Yes      Yes                                 +---------+---------------+---------+-----------+----------+--------------+ PTV      Full                                                        +---------+---------------+---------+-----------+----------+--------------+ PERO     Full                                                        +---------+---------------+---------+-----------+----------+--------------+     Summary: BILATERAL: - No evidence of deep vein thrombosis seen in the lower extremities, bilaterally.  -No evidence of popliteal cyst, bilaterally.   *See table(s) above for measurements and observations.    Preliminary    ECHOCARDIOGRAM COMPLETE Result Date: 07/28/2023    ECHOCARDIOGRAM REPORT   Patient Name:   [name] Date of Exam: 07/28/2023 Medical Rec #:  [id number]       Height:       67.0 in Accession #:    [id number]      Weight:       263.9 lb Date of Birth:  [birth year]       BSA:          2.275 m Patient Age:    72 years        BP:           156/92 mmHg Patient Gender: M               HR:           63 bpm. Exam Location:  Inpatient Procedure: 2D Echo, 3D Echo, Color Doppler, Cardiac Doppler and Strain Analysis            (Both Spectral and Color Flow Doppler were utilized during            procedure). Indications:    Other abnormalities of the heart  History:        Patient has no prior history of Echocardiogram examinations.                 Risk Factors:Hypertension.  Sonographer:    Travis Friedman RDCS Referring Phys: [id number] Ella Gun Rahi Chandonnet IMPRESSIONS  1. Left ventricular ejection fraction, by estimation, is 55 to 60%. The left ventricle has normal function. The left ventricle has no regional wall motion abnormalities. There is moderate left ventricular hypertrophy. Left ventricular diastolic parameters were normal. The average left ventricular global longitudinal strain is -18.8 %. The global longitudinal strain is normal.  2. Right ventricular systolic function is normal. The right ventricular size is normal.  3. The mitral valve is abnormal. No evidence of mitral valve regurgitation. No evidence of mitral stenosis.  4. The aortic valve is tricuspid. There is mild calcification of the aortic valve. There is mild thickening of the aortic valve. Aortic valve regurgitation is mild. Aortic valve sclerosis is present, with no evidence of aortic valve stenosis.  5. The inferior vena cava is dilated in size with >50% respiratory variability, suggesting right atrial pressure of 8 mmHg. FINDINGS  Left  Ventricle: Left ventricular ejection fraction, by estimation, is 55 to 60%. The left ventricle has normal function. The left ventricle has no regional wall motion abnormalities. The average left ventricular global longitudinal strain is -18.8 %. Strain was performed and the global longitudinal strain is normal. The left ventricular internal cavity size was normal in size. There is moderate left ventricular hypertrophy. Left ventricular diastolic parameters were normal. Right Ventricle: The right ventricular size is normal. No increase in right ventricular wall thickness. Right ventricular systolic function is normal. Left Atrium: Left atrial size was normal in size. Right Atrium: Right atrial size was normal in size. Pericardium: Trivial pericardial effusion is present. The pericardial effusion is posterior to the left ventricle. Mitral Valve: The mitral valve is abnormal. There is mild thickening of the mitral valve leaflet(s). There is mild calcification of the mitral valve leaflet(s). No evidence of mitral valve regurgitation. No evidence of mitral valve stenosis. Tricuspid Valve: The tricuspid valve is normal in structure. Tricuspid valve regurgitation is not demonstrated. No evidence of tricuspid stenosis. Aortic Valve: The aortic valve is tricuspid. There is mild calcification of the aortic valve. There is mild thickening of the aortic valve. Aortic valve regurgitation is mild. Aortic valve sclerosis is present, with no evidence of aortic valve stenosis. Aortic valve mean gradient measures 8.0  mmHg. Aortic valve peak gradient measures 15.8 mmHg. Aortic valve area, by VTI measures 3.47 cm. Pulmonic Valve: The pulmonic valve was normal in structure. Pulmonic valve regurgitation is not visualized. No evidence of pulmonic stenosis. Aorta: The aortic root is normal in size and structure. Venous: The inferior vena cava is dilated in size with greater than 50% respiratory variability, suggesting right atrial  pressure of 8 mmHg. IAS/Shunts: The interatrial septum appears to be lipomatous. No atrial level shunt detected by color flow Doppler. Additional Comments: 3D was performed not requiring image post processing on an independent workstation and was normal.  LEFT VENTRICLE PLAX 2D LVIDd:         5.20 cm   Diastology LVIDs:         3.40 cm   LV e' medial:    17.50 cm/s LV PW:         1.20 cm   LV E/e' medial:  8.5 LV IVS:        1.40 cm   LV e' lateral:   23.20 cm/s LVOT diam:     2.40 cm   LV E/e' lateral: 6.4 LV SV:         124 LV SV Index:   54        2D Longitudinal Strain LVOT Area:     4.52 cm  2D Strain GLS Avg:     -18.8 %                           3D Volume EF:                          3D EF:        55 %                          LV EDV:       250 ml                          LV ESV:       112 ml                          LV SV:        138 ml RIGHT VENTRICLE             IVC RV Basal diam:  3.70 cm     IVC diam: 2.50 cm RV S prime:     19.70 cm/s TAPSE (M-mode): 3.5 cm LEFT ATRIUM             Index        RIGHT ATRIUM           Index LA diam:        3.10 cm 1.36 cm/m   RA Area:     20.90 cm LA Vol (A2C):   73.2 ml 32.17 ml/m  RA Volume:   61.00 ml  26.81 ml/m LA Vol (A4C):   70.9 ml 31.16 ml/m LA Biplane Vol: 71.9 ml 31.60 ml/m  AORTIC VALVE AV Area (Vmax):    3.14 cm AV Area (Vmean):   3.16 cm AV Area (VTI):     3.47 cm AV Vmax:           199.00 cm/s AV Vmean:  132.000 cm/s AV VTI:            0.357 m AV Peak Grad:      15.8 mmHg AV Mean Grad:      8.0 mmHg LVOT Vmax:         138.00 cm/s LVOT Vmean:        92.100 cm/s LVOT VTI:          0.274 m LVOT/AV VTI ratio: 0.77  AORTA Ao Root diam: 3.00 cm Ao Asc diam:  3.60 cm MITRAL VALVE MV Area (PHT): 4.06 cm     SHUNTS MV Decel Time: 187 msec     Systemic VTI:  0.27 m MV E velocity: 149.00 cm/s  Systemic Diam: 2.40 cm MV A velocity: 134.00 cm/s MV E/A ratio:  1.11 Janelle Mediate MD Electronically signed by Janelle Mediate MD Signature Date/Time:  07/28/2023/11:34:19 AM    Final    US  Abdomen Limited Result Date: 07/27/2023 CLINICAL DATA:  Cirrhosis EXAM: ULTRASOUND ABDOMEN LIMITED RIGHT UPPER QUADRANT COMPARISON:  Chest CT today. FINDINGS: Gallbladder: Post cholecystectomy. Small fluid collection in the gallbladder fossa measures 3.2 x 2.1 x 2.4 cm, likely related to recent cholecystectomy. Common bile duct: Diameter: Normal caliber, 6 mm. Liver: Subtle nodularity to the liver surface suggesting cirrhosis. No focal hepatic abnormality. Portal vein is patent on color Doppler imaging with normal direction of blood flow towards the liver. Other: None. IMPRESSION: Prior recent cholecystectomy. Small fluid collection measuring up to 3.2 cm, likely postoperative fluid collection. Subtle nodularity to the liver surface suggests cirrhosis. Electronically Signed   By: Janeece Mechanic M.D.   On: 07/27/2023 19:34   CT Angio Chest PE W and/or Wo Contrast Result Date: 07/27/2023 CLINICAL DATA:  Concern for pulmonary embolism. EXAM: CT ANGIOGRAPHY CHEST WITH CONTRAST TECHNIQUE: Multidetector CT imaging of the chest was performed using the standard protocol during bolus administration of intravenous contrast. Multiplanar CT image reconstructions and MIPs were obtained to evaluate the vascular anatomy. RADIATION DOSE REDUCTION: This exam was performed according to the departmental dose-optimization program which includes automated exposure control, adjustment of the mA and/or kV according to patient size and/or use of iterative reconstruction technique. CONTRAST:  75mL OMNIPAQUE IOHEXOL 350 MG/ML SOLN COMPARISON:  Chest radiograph dated 07/27/2023. FINDINGS: Cardiovascular: There is no cardiomegaly or pericardial effusion. Mild atherosclerotic calcification of the thoracic aorta. No aneurysmal dilatation with dissection. Evaluation of the pulmonary arteries is limited due to respiratory motion and suboptimal visualization of the peripheral branches. No central pulmonary  artery embolus identified. Mediastinum/Nodes: No hilar or mediastinal adenopathy. Esophagus is grossly unremarkable no mediastinal fluid collection. Lungs/Pleura: Right lung base subsegmental atelectasis. Pneumonia is not excluded. There is no pleural effusion or pneumothorax. The central airways are patent. Upper Abdomen: Cirrhosis. Musculoskeletal: No acute osseous pathology. Review of the MIP images confirms the above findings. IMPRESSION: 1. No central pulmonary artery embolus. 2. Right lung base subsegmental atelectasis. Pneumonia is not excluded. 3. Cirrhosis. 4.  Aortic Atherosclerosis (ICD10-I70.0). Electronically Signed   By: Angus Bark M.D.   On: 07/27/2023 13:47   DG Chest 2 View Result Date: 07/27/2023 CLINICAL DATA:  Shortness of breath. Recent allergic reaction to penicillin. EXAM: CHEST - 2 VIEW COMPARISON:  X-ray 07/11/2022 FINDINGS: Underinflation. Bandlike opacity seen at the lung bases, right-greater-than-left. Atelectasis is favored over infiltrate. No pneumothorax or effusion. No edema. Stable cardiopericardial silhouette. IMPRESSION: Underinflation with some bandlike lung base opacities. Atelectasis is favored over infiltrate. Simple follow-up. Electronically Signed   By: Otho Blitz.D.  On: 07/27/2023 10:57       The results of significant diagnostics from this hospitalization (including imaging, microbiology, ancillary and laboratory) are listed below for reference.     Microbiology: Recent Results (from the past 240 hours)  MRSA Next Gen by PCR, Nasal     Status: None   Collection Time: 07/27/23  6:14 PM   Specimen: Nasal Mucosa; Nasal Swab  Result Value Ref Range Status   MRSA by PCR Next Gen NOT DETECTED NOT DETECTED Final    Comment: (NOTE) The GeneXpert MRSA Assay (FDA approved for NASAL specimens only), is one component of a comprehensive MRSA colonization surveillance program. It is not intended to diagnose MRSA infection nor to guide or monitor  treatment for MRSA infections. Test performance is not FDA approved in patients less than 79 years old. Performed at [facility], [address] [place], [place] [address]      Labs:  CBC: Recent Labs  Lab 07/27/23 [phone] 07/28/23 0705 07/29/23 0603  WBC 15.7* 16.7* 16.1*  NEUTROABS 10.9*  --   --   HGB 11.5* 11.9* 11.5*  HCT 36.7* 38.0* 35.2*  MCV 96.6 95.2 93.6  PLT 398 413* 413*   BMP &GFR Recent Labs  Lab 07/27/23 0958 07/28/23 0705 07/29/23 0603 07/29/23 1426  NA 143 139 143 142  K 3.7 3.7 3.0* 3.5  CL 104 101 101 100  CO2 28 30 31  32  GLUCOSE 104* 141* 96 138*  BUN 22 26* 29* 28*  CREATININE 0.95 0.95 0.92 0.88  CALCIUM 8.9 8.8* 8.9 9.0  MG  --  2.1 2.0  --   PHOS  --  4.5 3.7 3.2   Estimated Creatinine Clearance: 98 mL/min (by C-G formula based on SCr of 0.88 mg/dL). Liver & Pancreas: Recent Labs  Lab 07/27/23 0958 07/28/23 0705 07/29/23 0603 07/29/23 1426  AST 61* 33  --   --   ALT 91* 77*  --   --   ALKPHOS 59 72  --   --   BILITOT 0.7 0.8  --   --   PROT 5.6* 6.0*  --   --   ALBUMIN 2.6* 2.7* 2.5* 2.8*   Recent Labs  Lab 07/27/23 0958  LIPASE 22   No results for input(s): "AMMONIA" in the last 168 hours. Diabetic: Recent Labs    07/27/23 2028  HGBA1C 6.0*   No results for input(s): "GLUCAP" in the last 168 hours. Cardiac Enzymes: No results for input(s): "CKTOTAL", "CKMB", "CKMBINDEX", "TROPONINI" in the last 168 hours. No results for input(s): "PROBNP" in the last 8760 hours. Coagulation Profile: No results for input(s): "INR", "PROTIME" in the last 168 hours. Thyroid  Function Tests: Recent Labs    07/27/23 1138  TSH 1.709   Lipid Profile: No results for input(s): "CHOL", "HDL", "LDLCALC", "TRIG", "CHOLHDL", "LDLDIRECT" in the last 72 hours. Anemia Panel: No results for input(s): "VITAMINB12", "FOLATE", "FERRITIN", "TIBC", "IRON", "RETICCTPCT" in the last 72 hours. Urine analysis: No results found for: "COLORURINE",  "APPEARANCEUR", "LABSPEC", "PHURINE", "GLUCOSEU", "HGBUR", "BILIRUBINUR", "KETONESUR", "PROTEINUR", "UROBILINOGEN", "NITRITE", "LEUKOCYTESUR" Sepsis Labs: Invalid input(s): "PROCALCITONIN", "LACTICIDVEN"   SIGNED:  Theadore Finger, MD  [facility] 07/29/2023, 4:32 PM

## 2023-07-29 NOTE — Progress Notes (Signed)
 BLE venous duplex has been completed.   Results can be found under chart review under CV PROC. 07/29/2023 3:44 PM Carlen Fils RVT, RDMS

## 2023-08-07 DIAGNOSIS — M25511 Pain in right shoulder: Secondary | ICD-10-CM | POA: Diagnosis not present

## 2023-08-25 DIAGNOSIS — M79671 Pain in right foot: Secondary | ICD-10-CM | POA: Diagnosis not present

## 2023-09-13 DIAGNOSIS — M24811 Other specific joint derangements of right shoulder, not elsewhere classified: Secondary | ICD-10-CM | POA: Diagnosis not present

## 2023-09-13 DIAGNOSIS — M25511 Pain in right shoulder: Secondary | ICD-10-CM | POA: Diagnosis not present

## 2023-09-20 ENCOUNTER — Other Ambulatory Visit: Payer: Self-pay | Admitting: Orthopedic Surgery

## 2023-09-20 DIAGNOSIS — M75100 Unspecified rotator cuff tear or rupture of unspecified shoulder, not specified as traumatic: Secondary | ICD-10-CM

## 2023-09-20 DIAGNOSIS — G8929 Other chronic pain: Secondary | ICD-10-CM

## 2023-10-04 ENCOUNTER — Ambulatory Visit
Admission: RE | Admit: 2023-10-04 | Discharge: 2023-10-04 | Disposition: A | Discharge status: Home / Self Care | Source: Ambulatory Visit | Attending: Orthopedic Surgery | Admitting: Orthopedic Surgery

## 2023-10-04 DIAGNOSIS — M7581 Other shoulder lesions, right shoulder: Secondary | ICD-10-CM | POA: Diagnosis not present

## 2023-10-04 DIAGNOSIS — G8929 Other chronic pain: Secondary | ICD-10-CM

## 2023-10-04 DIAGNOSIS — M25511 Pain in right shoulder: Secondary | ICD-10-CM | POA: Diagnosis not present

## 2023-10-04 DIAGNOSIS — M75100 Unspecified rotator cuff tear or rupture of unspecified shoulder, not specified as traumatic: Secondary | ICD-10-CM

## 2023-10-04 DIAGNOSIS — Z9889 Other specified postprocedural states: Secondary | ICD-10-CM | POA: Diagnosis not present

## 2023-10-04 MED ORDER — IOPAMIDOL (ISOVUE-M 200) INJECTION 41%
12.0000 mL | Freq: Once | INTRAMUSCULAR | Status: AC
  Administered 2023-10-04: 12 mL via INTRA_ARTICULAR

## 2023-10-11 DIAGNOSIS — M75121 Complete rotator cuff tear or rupture of right shoulder, not specified as traumatic: Secondary | ICD-10-CM | POA: Diagnosis not present

## 2023-10-15 DIAGNOSIS — M75121 Complete rotator cuff tear or rupture of right shoulder, not specified as traumatic: Secondary | ICD-10-CM | POA: Diagnosis not present

## 2023-10-16 ENCOUNTER — Other Ambulatory Visit: Payer: Self-pay | Admitting: Orthopedic Surgery

## 2023-10-16 NOTE — Progress Notes (Incomplete)
 COVID Vaccine received:  [x]  No []  Yes Date of any COVID positive Test in last 90 days:  none  PCP -  Jodie Marek,  MD at   [facility] Cardiologist -   Chest x-ray - 07-27-2023  2v  Epic EKG -  07-30-2023 Stress Test -  ECHO -07-28-2023  Epic Cardiac Cath -  CT Coronary Calcium score:   Pacemaker / ICD device [x]  No []  Yes   Spinal Cord Stimulator:[x]  No []  Yes       History of Sleep Apnea? []  No [x]  Yes   CPAP used?- [x]  No []  Yes    Does the patient monitor blood sugar?   []  N/A   [x]  No []  Yes  Patient has: []  NO Hx DM   [x]  Pre-DM   []  DM1  []   DM2 Last A1c was:  6.0  on  07-27-23     no meds  Blood Thinner / Instructions:  none Aspirin  Instructions:  ASA 81 mg  already on hold  ERAS Protocol Ordered: [x]  No  []  Yes PRE-SURGERY []  ENSURE  []  G2   []  No Drink Ordered  Patient is to be NPO after: [place] - NO ORDERS available at PST.  Called Vina to get 2nd signature  Dental hx: []  Dentures:  [x]  N/A      []  Bridge or Partial:                   []  Loose or Damaged teeth:   Activity level: Able to walk up 2 flights of stairs without becoming significantly short of breath or having chest pain?  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  Patient verbalized understanding and agreement to the Pre-Surgical Instructions that were given to them at this PAT appointment. Patient was also educated of the need to review these PAT instructions again prior to his surgery.I reviewed the appropriate phone numbers to call if they have any and questions or concerns.

## 2023-10-17 ENCOUNTER — Other Ambulatory Visit: Payer: Self-pay

## 2023-10-17 ENCOUNTER — Encounter (HOSPITAL_COMMUNITY)
Admission: RE | Admit: 2023-10-17 | Discharge: 2023-10-17 | Disposition: A | Discharge status: Home / Self Care | Source: Ambulatory Visit | Attending: Obstetrics & Gynecology | Admitting: Obstetrics & Gynecology

## 2023-10-17 ENCOUNTER — Encounter (HOSPITAL_COMMUNITY): Payer: Self-pay

## 2023-10-17 VITALS — BP 160/92 | HR 74 | Temp 98.3°F | Resp 22 | Ht 67.0 in | Wt 262.0 lb

## 2023-10-17 DIAGNOSIS — K769 Liver disease, unspecified: Secondary | ICD-10-CM | POA: Insufficient documentation

## 2023-10-17 DIAGNOSIS — Z01812 Encounter for preprocedural laboratory examination: Secondary | ICD-10-CM | POA: Insufficient documentation

## 2023-10-17 DIAGNOSIS — Z01818 Encounter for other preprocedural examination: Secondary | ICD-10-CM

## 2023-10-17 DIAGNOSIS — I10 Essential (primary) hypertension: Secondary | ICD-10-CM

## 2023-10-17 DIAGNOSIS — R7303 Prediabetes: Secondary | ICD-10-CM | POA: Insufficient documentation

## 2023-10-17 HISTORY — DX: Prediabetes: R73.03

## 2023-10-17 HISTORY — DX: Unspecified cirrhosis of liver: K74.60

## 2023-10-17 HISTORY — DX: Anemia, unspecified: D64.9

## 2023-10-17 HISTORY — DX: Other cirrhosis of liver: K74.69

## 2023-10-17 LAB — COMPREHENSIVE METABOLIC PANEL WITH GFR
ALT: 17 U/L (ref 0–44)
AST: 17 U/L (ref 15–41)
Albumin: 4.1 g/dL (ref 3.5–5.0)
Alkaline Phosphatase: 77 U/L (ref 38–126)
Anion gap: 8 (ref 5–15)
BUN: 18 mg/dL (ref 8–23)
CO2: 26 mmol/L (ref 22–32)
Calcium: 9.8 mg/dL (ref 8.9–10.3)
Chloride: 106 mmol/L (ref 98–111)
Creatinine, Ser: 0.88 mg/dL (ref 0.61–1.24)
GFR, Estimated: >60 mL/min (ref >60)
Glucose, Bld: 92 mg/dL (ref 70–99)
Potassium: 4 mmol/L (ref 3.5–5.1)
Sodium: 140 mmol/L (ref 135–145)
Total Bilirubin: 1.2 mg/dL (ref 0.0–1.2)
Total Protein: 7.2 g/dL (ref 6.5–8.1)

## 2023-10-17 LAB — CBC
HCT: 44.3 % (ref 39.0–52.0)
Hemoglobin: 14.4 g/dL (ref 13.0–17.0)
MCH: 31.4 pg (ref 26.0–34.0)
MCHC: 32.5 g/dL (ref 30.0–36.0)
MCV: 96.5 fL (ref 80.0–100.0)
Platelets: 240 K/uL (ref 150–400)
RBC: 4.59 MIL/uL (ref 4.22–5.81)
RDW: 15.2 % (ref 11.5–15.5)
WBC: 8.3 K/uL (ref 4.0–10.5)
nRBC: 0 % (ref 0.0–0.2)

## 2023-10-17 NOTE — Patient Instructions (Addendum)
 SURGICAL WAITING ROOM VISITATION Patients having surgery or a procedure may have no more than 2 support people in the waiting area - these visitors may rotate in the visitor waiting room.   If the patient needs to stay at the hospital during part of their recovery, the visitor guidelines for inpatient rooms apply.  PRE-OP VISITATION  Pre-op nurse will coordinate an appropriate time for 1 support person to accompany the patient in pre-op.  This support person may not rotate.  This visitor will be contacted when the time is appropriate for the visitor to come back in the pre-op area.  Please refer to the [facility] website for the visitor guidelines for Inpatients (after your surgery is over and you are in a regular room).  You are not required to quarantine at this time prior to your surgery. However, you must do this: Hand Hygiene often Do NOT share personal items Notify your provider if you are in close contact with someone who has COVID or you develop fever 100.4 or greater, new onset of sneezing, cough, sore throat, shortness of breath or body aches.  If you test positive for Covid or have been in contact with anyone that has tested positive in the last 10 days please notify you surgeon.    Your procedure is scheduled on:  Thursday  October 25, 2023  Report to [facility] Main Entrance: [address] entrance where the [employer] is available.   Report to admitting at:  07:45   AM  Call this number if you have any questions or problems the morning of surgery [phone]  DO NOT EAT OR DRINK ANYTHING AFTER MIDNIGHT THE NIGHT PRIOR TO YOUR SURGERY / PROCEDURE.   FOLLOW  ANY ADDITIONAL PRE OP INSTRUCTIONS YOU RECEIVED FROM YOUR SURGEON'S OFFICE!!!   Oral Hygiene is also important to reduce your risk of infection.        Remember - BRUSH YOUR TEETH THE MORNING OF SURGERY WITH YOUR REGULAR TOOTHPASTE  Do NOT smoke after Midnight the night before surgery.  STOP TAKING all  Vitamins, Herbs and supplements 1 week before your surgery.   Take ONLY these medicines the morning of surgery with A SIP OF WATER : none  DO NOT TAKE Losartan  the morning of your surgery.  You may not have any metal on your body including jewelry, and body piercing  Do not wear  lotions, powders, cologne, or deodorant  Men may shave face and neck.  Contacts, Hearing Aids, dentures or bridgework may not be worn into surgery. DENTURES WILL BE REMOVED PRIOR TO SURGERY PLEASE DO NOT APPLY Poly grip OR ADHESIVES!!!  Patients discharged on the day of surgery will not be allowed to drive home.  Someone NEEDS to stay with you for the first 24 hours after anesthesia.  Do not bring your home medications to the hospital. The Pharmacy will dispense medications listed on your medication list to you during your admission in the Hospital.  Please read over the following fact sheets you were given: IF YOU HAVE QUESTIONS ABOUT YOUR PRE-OP INSTRUCTIONS, PLEASE CALL [phone].   [place] - Preparing for Surgery Before surgery, you can play an important role.  Because skin is not sterile, your skin needs to be as free of germs as possible.  You can reduce the number of germs on your skin by washing with CHG (chlorahexidine gluconate) soap before surgery.  CHG is an antiseptic cleaner which kills germs and bonds with the skin to continue killing germs  even after washing. Please DO NOT use if you have an allergy to CHG or antibacterial soaps.  If your skin becomes reddened/irritated stop using the CHG and inform your nurse when you arrive at Short Stay. Do not shave (including legs and underarms) for at least 48 hours prior to the first CHG shower.  You may shave your face/neck.  Please follow these instructions carefully:  1.  Shower with CHG Soap the night before surgery and the  morning of surgery.  2.  If you choose to wash your hair, wash your hair first as usual with your normal  shampoo.  3.   After you shampoo, rinse your hair and body thoroughly to remove the shampoo.                             4.  Use CHG as you would any other liquid soap.  You can apply chg directly to the skin and wash.  Gently with a scrungie or clean washcloth.  5.  Apply the CHG Soap to your body ONLY FROM THE NECK DOWN.   Do not use on face/ open                           Wound or open sores. Avoid contact with eyes, ears mouth and genitals (private parts).                       Wash face,  Genitals (private parts) with your normal soap.             6.  Wash thoroughly, paying special attention to the area where your  surgery  will be performed.  7.  Thoroughly rinse your body with warm water  from the neck down.  8.  DO NOT shower/wash with your normal soap after using and rinsing off the CHG Soap.            9.  Pat yourself dry with a clean towel.            10.  Wear clean pajamas.            11.  Place clean sheets on your bed the night of your first shower and do not  sleep with pets.  ON THE DAY OF SURGERY : Do not apply any lotions/deodorants the morning of surgery.  Please wear clean clothes to the hospital/surgery center.    FAILURE TO FOLLOW THESE INSTRUCTIONS MAY RESULT IN THE CANCELLATION OF YOUR SURGERY  PATIENT SIGNATURE_________________________________  NURSE SIGNATURE__________________________________  ________________________________________________________________________        [name]    An incentive spirometer is a tool that can help keep your lungs clear and active. This tool measures how well you are filling your lungs with each breath. Taking long deep breaths may help reverse or decrease the chance of developing breathing (pulmonary) problems (especially infection) following: A long period of time when you are unable to move or be active. BEFORE THE PROCEDURE  If the spirometer includes an indicator to show your best effort, your nurse or respiratory  therapist will set it to a desired goal. If possible, sit up straight or lean slightly forward. Try not to slouch. Hold the incentive spirometer in an upright position. INSTRUCTIONS FOR USE  Sit on the edge of your bed if possible, or sit up as far as you can in bed or  on a chair. Hold the incentive spirometer in an upright position. Breathe out normally. Place the mouthpiece in your mouth and seal your lips tightly around it. Breathe in slowly and as deeply as possible, raising the piston or the ball toward the top of the column. Hold your breath for 3-5 seconds or for as long as possible. Allow the piston or ball to fall to the bottom of the column. Remove the mouthpiece from your mouth and breathe out normally. Rest for a few seconds and repeat Steps 1 through 7 at least 10 times every 1-2 hours when you are awake. Take your time and take a few normal breaths between deep breaths. The spirometer may include an indicator to show your best effort. Use the indicator as a goal to work toward during each repetition. After each set of 10 deep breaths, practice coughing to be sure your lungs are clear. If you have an incision (the cut made at the time of surgery), support your incision when coughing by placing a pillow or rolled up towels firmly against it. Once you are able to get out of bed, walk around indoors and cough well. You may stop using the incentive spirometer when instructed by your caregiver.  RISKS AND COMPLICATIONS Take your time so you do not get dizzy or light-headed. If you are in pain, you may need to take or ask for pain medication before doing incentive spirometry. It is harder to take a deep breath if you are having pain. AFTER USE Rest and breathe slowly and easily. It can be helpful to keep track of a log of your progress. Your caregiver can provide you with a simple table to help with this. If you are using the spirometer at home, follow these instructions: SEEK MEDICAL  CARE IF:  You are having difficultly using the spirometer. You have trouble using the spirometer as often as instructed. Your pain medication is not giving enough relief while using the spirometer. You develop fever of 100.5 F (38.1 C) or higher.                                                                                                    SEEK IMMEDIATE MEDICAL CARE IF:  You cough up bloody sputum that had not been present before. You develop fever of 102 F (38.9 C) or greater. You develop worsening pain at or near the incision site. MAKE SURE YOU:  Understand these instructions. Will watch your condition. Will get help right away if you are not doing well or get worse. Document Released: 07/24/2006 Document Revised: 06/05/2011 Document Reviewed: 09/24/2006 [facility] Patient Information 2014 [place], [place].

## 2023-10-25 ENCOUNTER — Encounter (HOSPITAL_COMMUNITY)
Admission: RE | Disposition: A | Discharge status: Home / Self Care | Payer: Self-pay | Source: Home / Self Care | Attending: Orthopedic Surgery

## 2023-10-25 ENCOUNTER — Ambulatory Visit (HOSPITAL_COMMUNITY): Payer: Self-pay | Admitting: Medical

## 2023-10-25 ENCOUNTER — Ambulatory Visit (HOSPITAL_COMMUNITY)
Admission: RE | Admit: 2023-10-25 | Discharge: 2023-10-25 | Disposition: A | Discharge status: Home / Self Care | Attending: Orthopedic Surgery | Admitting: Orthopedic Surgery

## 2023-10-25 ENCOUNTER — Encounter (HOSPITAL_COMMUNITY): Payer: Self-pay | Admitting: Orthopedic Surgery

## 2023-10-25 ENCOUNTER — Other Ambulatory Visit: Payer: Self-pay

## 2023-10-25 ENCOUNTER — Ambulatory Visit (HOSPITAL_COMMUNITY): Admitting: Anesthesiology

## 2023-10-25 DIAGNOSIS — E66813 Obesity, class 3: Secondary | ICD-10-CM | POA: Insufficient documentation

## 2023-10-25 DIAGNOSIS — K7460 Unspecified cirrhosis of liver: Secondary | ICD-10-CM | POA: Insufficient documentation

## 2023-10-25 DIAGNOSIS — M7541 Impingement syndrome of right shoulder: Secondary | ICD-10-CM | POA: Diagnosis not present

## 2023-10-25 DIAGNOSIS — M25811 Other specified joint disorders, right shoulder: Secondary | ICD-10-CM | POA: Diagnosis not present

## 2023-10-25 DIAGNOSIS — G4730 Sleep apnea, unspecified: Secondary | ICD-10-CM | POA: Insufficient documentation

## 2023-10-25 DIAGNOSIS — I10 Essential (primary) hypertension: Secondary | ICD-10-CM

## 2023-10-25 DIAGNOSIS — I119 Hypertensive heart disease without heart failure: Secondary | ICD-10-CM | POA: Diagnosis not present

## 2023-10-25 DIAGNOSIS — M1990 Unspecified osteoarthritis, unspecified site: Secondary | ICD-10-CM | POA: Insufficient documentation

## 2023-10-25 DIAGNOSIS — R7303 Prediabetes: Secondary | ICD-10-CM | POA: Insufficient documentation

## 2023-10-25 DIAGNOSIS — K7581 Nonalcoholic steatohepatitis (NASH): Secondary | ICD-10-CM | POA: Diagnosis not present

## 2023-10-25 DIAGNOSIS — Z6841 Body Mass Index (BMI) 40.0 and over, adult: Secondary | ICD-10-CM | POA: Insufficient documentation

## 2023-10-25 DIAGNOSIS — M94211 Chondromalacia, right shoulder: Secondary | ICD-10-CM | POA: Insufficient documentation

## 2023-10-25 DIAGNOSIS — R0601 Orthopnea: Secondary | ICD-10-CM | POA: Diagnosis not present

## 2023-10-25 DIAGNOSIS — F32A Depression, unspecified: Secondary | ICD-10-CM | POA: Diagnosis not present

## 2023-10-25 DIAGNOSIS — I358 Other nonrheumatic aortic valve disorders: Secondary | ICD-10-CM | POA: Insufficient documentation

## 2023-10-25 DIAGNOSIS — S46011A Strain of muscle(s) and tendon(s) of the rotator cuff of right shoulder, initial encounter: Secondary | ICD-10-CM | POA: Diagnosis not present

## 2023-10-25 DIAGNOSIS — M75101 Unspecified rotator cuff tear or rupture of right shoulder, not specified as traumatic: Secondary | ICD-10-CM

## 2023-10-25 DIAGNOSIS — G8918 Other acute postprocedural pain: Secondary | ICD-10-CM | POA: Diagnosis not present

## 2023-10-25 HISTORY — PX: POSTERIOR LUMBAR FUSION 2 WITH HARDWARE REMOVAL: SHX7297

## 2023-10-25 HISTORY — PX: SHOULDER ARTHROSCOPY WITH ROTATOR CUFF REPAIR: SHX5685

## 2023-10-25 HISTORY — PX: SUBACROMIAL DECOMPRESSION: SHX5174

## 2023-10-25 SURGERY — ARTHROSCOPY, SHOULDER, WITH ROTATOR CUFF REPAIR
Anesthesia: Regional | Site: Shoulder | Laterality: Right

## 2023-10-25 MED ORDER — MENTHOL 3 MG MT LOZG: 1.0000 | LOZENGE | OROMUCOSAL | Status: DC | PRN

## 2023-10-25 MED ORDER — LIDOCAINE HCL (PF) 2 % IJ SOLN
INTRAMUSCULAR | Status: AC
Start: 2023-10-25 — End: 2023-10-25
  Filled 2023-10-25: qty 5

## 2023-10-25 MED ORDER — TRAZODONE HCL 100 MG PO TABS
100.0000 mg | ORAL_TABLET | Freq: Every day | ORAL | Status: DC
  Filled 2023-10-25: qty 1

## 2023-10-25 MED ORDER — SUGAMMADEX SODIUM 200 MG/2ML IV SOLN
INTRAVENOUS | Status: DC | PRN
  Administered 2023-10-25: 50 mg via INTRAVENOUS
  Administered 2023-10-25: 250 mg via INTRAVENOUS

## 2023-10-25 MED ORDER — CEFAZOLIN SODIUM-DEXTROSE 2-4 GM/100ML-% IV SOLN
INTRAVENOUS | Status: AC
  Filled 2023-10-25: qty 100

## 2023-10-25 MED ORDER — PROPOFOL 10 MG/ML IV BOLUS
INTRAVENOUS | Status: DC | PRN
  Administered 2023-10-25: 200 mg via INTRAVENOUS

## 2023-10-25 MED ORDER — BUPIVACAINE LIPOSOME 1.3 % IJ SUSP
INTRAMUSCULAR | Status: DC | PRN
Start: 2023-10-25 — End: 2023-10-25
  Administered 2023-10-25: 10 mL via PERINEURAL

## 2023-10-25 MED ORDER — OXYCODONE HCL 5 MG PO TABS
ORAL_TABLET | ORAL | Status: AC
  Filled 2023-10-25: qty 1

## 2023-10-25 MED ORDER — DOCUSATE SODIUM 100 MG PO CAPS
100.0000 mg | ORAL_CAPSULE | Freq: Two times a day (BID) | ORAL | Status: DC
  Filled 2023-10-25: qty 1

## 2023-10-25 MED ORDER — LOSARTAN POTASSIUM 50 MG PO TABS
50.0000 mg | ORAL_TABLET | Freq: Every day | ORAL | Status: DC
  Filled 2023-10-25: qty 1

## 2023-10-25 MED ORDER — ACETAMINOPHEN 500 MG PO TABS: 1000.0000 mg | ORAL_TABLET | Freq: Four times a day (QID) | ORAL | Status: DC

## 2023-10-25 MED ORDER — FENTANYL CITRATE PF 50 MCG/ML IJ SOSY
25.0000 ug | PREFILLED_SYRINGE | INTRAMUSCULAR | Status: DC | PRN
  Administered 2023-10-25: 50 ug via INTRAVENOUS

## 2023-10-25 MED ORDER — SODIUM CHLORIDE 0.9 % IV SOLN: INTRAVENOUS | Status: DC

## 2023-10-25 MED ORDER — CHLORHEXIDINE GLUCONATE 0.12 % MT SOLN
15.0000 mL | Freq: Once | OROMUCOSAL | Status: AC
  Administered 2023-10-25: 15 mL via OROMUCOSAL

## 2023-10-25 MED ORDER — ACETAMINOPHEN 325 MG PO TABS: 325.0000 mg | ORAL_TABLET | Freq: Four times a day (QID) | ORAL | Status: DC | PRN

## 2023-10-25 MED ORDER — SUGAMMADEX SODIUM 200 MG/2ML IV SOLN
INTRAVENOUS | Status: AC
Start: 2023-10-25 — End: 2023-10-25
  Filled 2023-10-25: qty 2

## 2023-10-25 MED ORDER — OXYCODONE HCL 5 MG PO TABS: 10.0000 mg | ORAL_TABLET | ORAL | Status: DC | PRN

## 2023-10-25 MED ORDER — ONDANSETRON HCL 4 MG/2ML IJ SOLN
INTRAMUSCULAR | Status: AC
  Filled 2023-10-25: qty 2

## 2023-10-25 MED ORDER — ASPIRIN 81 MG PO TBEC
81.0000 mg | DELAYED_RELEASE_TABLET | Freq: Every day | ORAL | Status: DC
  Filled 2023-10-25: qty 1

## 2023-10-25 MED ORDER — ALUM & MAG HYDROXIDE-SIMETH 200-200-20 MG/5ML PO SUSP: 30.0000 mL | ORAL | Status: DC | PRN

## 2023-10-25 MED ORDER — PROPOFOL 10 MG/ML IV BOLUS
INTRAVENOUS | Status: AC
  Filled 2023-10-25: qty 20

## 2023-10-25 MED ORDER — CEFAZOLIN SODIUM-DEXTROSE 2-4 GM/100ML-% IV SOLN
2.0000 g | INTRAVENOUS | Status: AC
  Administered 2023-10-25: 2 g via INTRAVENOUS

## 2023-10-25 MED ORDER — FENTANYL CITRATE (PF) 100 MCG/2ML IJ SOLN
INTRAMUSCULAR | Status: AC
  Filled 2023-10-25: qty 2

## 2023-10-25 MED ORDER — METHOCARBAMOL 1000 MG/10ML IJ SOLN: 500.0000 mg | Freq: Four times a day (QID) | INTRAMUSCULAR | Status: DC | PRN

## 2023-10-25 MED ORDER — FENTANYL CITRATE (PF) 100 MCG/2ML IJ SOLN
INTRAMUSCULAR | Status: DC | PRN
  Administered 2023-10-25: 25 ug via INTRAVENOUS
  Administered 2023-10-25: 50 ug via INTRAVENOUS

## 2023-10-25 MED ORDER — OXYCODONE HCL 5 MG PO TABS: 5.0000 mg | ORAL_TABLET | ORAL | 0 refills | Status: DC | PRN

## 2023-10-25 MED ORDER — DEXAMETHASONE SODIUM PHOSPHATE 10 MG/ML IJ SOLN
INTRAMUSCULAR | Status: AC
  Filled 2023-10-25: qty 1

## 2023-10-25 MED ORDER — FENTANYL CITRATE PF 50 MCG/ML IJ SOSY
50.0000 ug | PREFILLED_SYRINGE | Freq: Once | INTRAMUSCULAR | Status: AC
  Administered 2023-10-25: 50 ug via INTRAVENOUS
  Filled 2023-10-25: qty 2

## 2023-10-25 MED ORDER — DEXAMETHASONE SODIUM PHOSPHATE 10 MG/ML IJ SOLN
INTRAMUSCULAR | Status: DC | PRN
  Administered 2023-10-25: 5 mg via INTRAVENOUS

## 2023-10-25 MED ORDER — METHOCARBAMOL 500 MG PO TABS: 500.0000 mg | ORAL_TABLET | Freq: Four times a day (QID) | ORAL | Status: DC | PRN

## 2023-10-25 MED ORDER — ROCURONIUM BROMIDE 10 MG/ML (PF) SYRINGE
PREFILLED_SYRINGE | INTRAVENOUS | Status: AC
  Filled 2023-10-25: qty 10

## 2023-10-25 MED ORDER — LACTATED RINGERS IV SOLN: INTRAVENOUS | Status: DC

## 2023-10-25 MED ORDER — EPHEDRINE SULFATE (PRESSORS) 50 MG/ML IJ SOLN
INTRAMUSCULAR | Status: DC | PRN
  Administered 2023-10-25: 5 mg via INTRAVENOUS

## 2023-10-25 MED ORDER — FLEET ENEMA RE ENEM: 1.0000 | ENEMA | Freq: Once | RECTAL | Status: DC | PRN

## 2023-10-25 MED ORDER — EPHEDRINE 5 MG/ML INJ
INTRAVENOUS | Status: AC
  Filled 2023-10-25: qty 5

## 2023-10-25 MED ORDER — BUPIVACAINE HCL (PF) 0.5 % IJ SOLN
INTRAMUSCULAR | Status: DC | PRN
  Administered 2023-10-25: 15 mL via PERINEURAL

## 2023-10-25 MED ORDER — TIZANIDINE HCL 4 MG PO TABS: 4.0000 mg | ORAL_TABLET | Freq: Three times a day (TID) | ORAL | 0 refills | Status: AC | PRN

## 2023-10-25 MED ORDER — ROCURONIUM BROMIDE 100 MG/10ML IV SOLN
INTRAVENOUS | Status: DC | PRN
  Administered 2023-10-25: 20 mg via INTRAVENOUS
  Administered 2023-10-25: 50 mg via INTRAVENOUS

## 2023-10-25 MED ORDER — PHENYLEPHRINE 80 MCG/ML (10ML) SYRINGE FOR IV PUSH (FOR BLOOD PRESSURE SUPPORT)
PREFILLED_SYRINGE | INTRAVENOUS | Status: AC
  Filled 2023-10-25: qty 10

## 2023-10-25 MED ORDER — ACETAMINOPHEN 500 MG PO TABS: 1000.0000 mg | ORAL_TABLET | Freq: Once | ORAL | Status: DC

## 2023-10-25 MED ORDER — ONDANSETRON HCL 4 MG/2ML IJ SOLN
INTRAMUSCULAR | Status: DC | PRN
  Administered 2023-10-25: 4 mg via INTRAVENOUS

## 2023-10-25 MED ORDER — ORAL CARE MOUTH RINSE: 15.0000 mL | Freq: Once | OROMUCOSAL | Status: DC

## 2023-10-25 MED ORDER — LIDOCAINE HCL (CARDIAC) PF 100 MG/5ML IV SOSY
PREFILLED_SYRINGE | INTRAVENOUS | Status: DC | PRN
  Administered 2023-10-25: 80 mg via INTRAVENOUS

## 2023-10-25 MED ORDER — ONDANSETRON HCL 4 MG PO TABS: 4.0000 mg | ORAL_TABLET | Freq: Four times a day (QID) | ORAL | Status: DC | PRN

## 2023-10-25 MED ORDER — POVIDONE-IODINE 7.5 % EX SOLN: Freq: Once | CUTANEOUS | Status: DC

## 2023-10-25 MED ORDER — POLYETHYLENE GLYCOL 3350 17 G PO PACK: 17.0000 g | PACK | Freq: Every day | ORAL | Status: DC | PRN

## 2023-10-25 MED ORDER — BISACODYL 5 MG PO TBEC: 5.0000 mg | DELAYED_RELEASE_TABLET | Freq: Every day | ORAL | Status: DC | PRN

## 2023-10-25 MED ORDER — CHLORHEXIDINE GLUCONATE 0.12 % MT SOLN: 15.0000 mL | Freq: Once | OROMUCOSAL | Status: DC

## 2023-10-25 MED ORDER — ONDANSETRON HCL 4 MG/2ML IJ SOLN: 4.0000 mg | Freq: Four times a day (QID) | INTRAMUSCULAR | Status: DC | PRN

## 2023-10-25 MED ORDER — MIDAZOLAM HCL 2 MG/2ML IJ SOLN
1.0000 mg | Freq: Once | INTRAMUSCULAR | Status: AC
  Administered 2023-10-25: 2 mg via INTRAVENOUS
  Filled 2023-10-25: qty 2

## 2023-10-25 MED ORDER — SODIUM CHLORIDE 0.9 % IR SOLN
Status: DC | PRN
  Administered 2023-10-25: 6000 mL

## 2023-10-25 MED ORDER — AMISULPRIDE (ANTIEMETIC) 5 MG/2ML IV SOLN: 10.0000 mg | Freq: Once | INTRAVENOUS | Status: DC | PRN

## 2023-10-25 MED ORDER — SUGAMMADEX SODIUM 200 MG/2ML IV SOLN
INTRAVENOUS | Status: AC
  Filled 2023-10-25: qty 2

## 2023-10-25 MED ORDER — FENTANYL CITRATE PF 50 MCG/ML IJ SOSY
PREFILLED_SYRINGE | INTRAMUSCULAR | Status: AC
  Filled 2023-10-25: qty 1

## 2023-10-25 MED ORDER — HYDROMORPHONE HCL 1 MG/ML IJ SOLN: 0.5000 mg | INTRAMUSCULAR | Status: DC | PRN

## 2023-10-25 MED ORDER — DIPHENHYDRAMINE HCL 12.5 MG/5ML PO ELIX: 12.5000 mg | ORAL_SOLUTION | ORAL | Status: DC | PRN

## 2023-10-25 MED ORDER — 0.9 % SODIUM CHLORIDE (POUR BTL) OPTIME
TOPICAL | Status: DC | PRN
  Administered 2023-10-25: 1000 mL

## 2023-10-25 MED ORDER — PHENOL 1.4 % MT LIQD: 1.0000 | OROMUCOSAL | Status: DC | PRN

## 2023-10-25 MED ORDER — OXYCODONE HCL 5 MG PO TABS: 5.0000 mg | ORAL_TABLET | ORAL | Status: DC | PRN

## 2023-10-25 MED ORDER — OXYCODONE HCL 5 MG PO TABS
5.0000 mg | ORAL_TABLET | Freq: Once | ORAL | Status: AC | PRN
  Administered 2023-10-25: 5 mg via ORAL

## 2023-10-25 MED ORDER — ALBUTEROL SULFATE HFA 108 (90 BASE) MCG/ACT IN AERS
INHALATION_SPRAY | RESPIRATORY_TRACT | Status: DC | PRN
  Administered 2023-10-25 (×2): 5 via RESPIRATORY_TRACT

## 2023-10-25 MED ORDER — PHENYLEPHRINE HCL-NACL 20-0.9 MG/250ML-% IV SOLN
INTRAVENOUS | Status: DC | PRN
  Administered 2023-10-25: 50 ug/min via INTRAVENOUS

## 2023-10-25 MED ORDER — PHENYLEPHRINE HCL (PRESSORS) 10 MG/ML IV SOLN
INTRAVENOUS | Status: DC | PRN
  Administered 2023-10-25: 80 ug via INTRAVENOUS
  Administered 2023-10-25: 160 ug via INTRAVENOUS

## 2023-10-25 MED ORDER — OXYCODONE HCL 5 MG/5ML PO SOLN: 5.0000 mg | Freq: Once | ORAL | Status: AC | PRN

## 2023-10-25 SURGICAL SUPPLY — 49 items
BAG COUNTER SPONGE SURGICOUNT (BAG) IMPLANT
BLADE SHAVER BONE 5.0X13 (MISCELLANEOUS) IMPLANT
BOOTIES KNEE HIGH SLOAN (MISCELLANEOUS) ×6 IMPLANT
BURR OVAL 8 FLU 4.0X13 (MISCELLANEOUS) ×3 IMPLANT
CANNULA 5.75X7 CRYSTAL CLEAR (CANNULA) ×3 IMPLANT
CANNULA TWIST IN 8.25X7CM (CANNULA) IMPLANT
COVER SURGICAL LIGHT HANDLE (MISCELLANEOUS) IMPLANT
CUTTER BONE 4.0MM X 13CM (MISCELLANEOUS) ×3 IMPLANT
DISSECTOR 3.8MM X 13CM (MISCELLANEOUS) IMPLANT
DRAPE FOOT SWITCH (DRAPES) ×6 IMPLANT
DRAPE IMP U-DRAPE 54X76 (DRAPES) ×3 IMPLANT
DRAPE INCISE IOBAN 66X45 STRL (DRAPES) IMPLANT
DRAPE STERI 35X30 U-POUCH (DRAPES) ×3 IMPLANT
DRAPE SURG 17X23 STRL (DRAPES) ×3 IMPLANT
DRAPE SURG ORHT 6 SPLT 77X108 (DRAPES) ×6 IMPLANT
DRAPE TOP 10253 STERILE (DRAPES) ×3 IMPLANT
DRAPE U-SHAPE 47X51 STRL (DRAPES) ×3 IMPLANT
DURAPREP 26ML APPLICATOR (WOUND CARE) ×3 IMPLANT
ELECT REM PT RETURN 15FT ADLT (MISCELLANEOUS) IMPLANT
GAUZE PAD ABD 8X10 STRL (GAUZE/BANDAGES/DRESSINGS) ×6 IMPLANT
GAUZE SPONGE 4X4 12PLY STRL (GAUZE/BANDAGES/DRESSINGS) ×3 IMPLANT
GAUZE XEROFORM 1X8 LF (GAUZE/BANDAGES/DRESSINGS) ×3 IMPLANT
GAUZE XEROFORM 5X9 LF (GAUZE/BANDAGES/DRESSINGS) IMPLANT
GLOVE BIO SURGEON STRL SZ7.5 (GLOVE) ×3 IMPLANT
GLOVE BIOGEL PI IND STRL 6.5 (GLOVE) ×3 IMPLANT
GLOVE BIOGEL PI IND STRL 8 (GLOVE) ×3 IMPLANT
GLOVE SURG SS PI 6.5 STRL IVOR (GLOVE) ×3 IMPLANT
GOWN STRL REUS W/ TWL LRG LVL3 (GOWN DISPOSABLE) ×3 IMPLANT
GOWN STRL REUS W/ TWL XL LVL3 (GOWN DISPOSABLE) ×3 IMPLANT
KIT BASIN OR (CUSTOM PROCEDURE TRAY) ×3 IMPLANT
KIT TURNOVER KIT A (KITS) ×3 IMPLANT
LASSO CRESCENT QUICKPASS (SUTURE) IMPLANT
MANIFOLD NEPTUNE II (INSTRUMENTS) ×3 IMPLANT
NDL HD SCORPION MEGA LOADER (NEEDLE) IMPLANT
PACK ARTHROSCOPY WL (CUSTOM PROCEDURE TRAY) ×3 IMPLANT
PROTECTOR NERVE ULNAR (MISCELLANEOUS) ×3 IMPLANT
RESTRAINT HEAD UNIVERSAL NS (MISCELLANEOUS) ×3 IMPLANT
SLING ARM FOAM STRAP LRG (SOFTGOODS) IMPLANT
SLING ARM FOAM STRAP MED (SOFTGOODS) IMPLANT
SUPPORT WRAP ARM LG (MISCELLANEOUS) IMPLANT
SUT ETHILON 3 0 PS 1 (SUTURE) ×3 IMPLANT
SUT PDS AB 1 CT1 27 (SUTURE) IMPLANT
SUT TIGER TAPE 7 IN WHITE (SUTURE) IMPLANT
SUT VIC AB 0 CT1 36 (SUTURE) IMPLANT
SUTURE TAPE 1.3 40 TPR END (SUTURE) IMPLANT
TAPE FIBER 2MM 7IN #2 BLUE (SUTURE) IMPLANT
TOWEL OR 17X26 10 PK STRL BLUE (TOWEL DISPOSABLE) ×3 IMPLANT
TUBING ARTHROSCOPY IRRIG 16FT (MISCELLANEOUS) ×3 IMPLANT
WAND ABLATOR APOLLO I90 (BUR) ×3 IMPLANT

## 2023-10-25 NOTE — Transfer of Care (Signed)
 Immediate Anesthesia Transfer of Care Note  Patient: [name]  Procedure(s) Performed: ARTHROSCOPY, SHOULDER, WITH ROTATOR CUFF REPAIR (Right: Shoulder) ARTHROSCOPY, SHOULDER WITH DEBRIDEMENT (Right) DECOMPRESSION, SUBACROMIAL SPACE (Shoulder)  Patient Location: PACU  Anesthesia Type:General  Level of Consciousness: awake, alert , oriented, and patient cooperative  Airway & Oxygen Therapy: Patient Spontanous Breathing and Patient connected to face mask oxygen  Post-op Assessment: Report given to RN and Post -op Vital signs reviewed and stable  Post vital signs: Reviewed and stable  Last Vitals:  Vitals Value Taken Time  BP 177/100 10/25/23 11:05  Temp 36.9 C 10/25/23 11:05  Pulse 89 10/25/23 11:09  Resp 14 10/25/23 11:09  SpO2 99 % 10/25/23 11:09  Vitals shown include unfiled device data.  Last Pain:  Vitals:   10/25/23 1105  TempSrc:   PainSc: 0-No pain      Patients Stated Pain Goal: 4 (10/25/23 9178)  Complications: No notable events documented.

## 2023-10-25 NOTE — Discharge Instructions (Addendum)
Discharge Instructions after Arthroscopic Shoulder Surgery   A sling has been provided for you. You may remove the sling after 72 hours. The sling may be worn for your protection, if you are in a crowd.  Use ice on the shoulder intermittently over the first 48 hours after surgery.  Pain medication has been prescribed for you.  Use your medication liberally over the first 48 hours, and then begin to taper your use. You may take Extra Strength Tylenol or Tylenol only in place of the pain pills. DO NOT take ANY nonsteroidal anti-inflammatory pain medications: Advil, Motrin, Ibuprofen, Aleve, Naproxen, or Naprosyn.  You may remove your dressing after two days.  You may shower 5 days after surgery. The incision CANNOT get wet prior to 5 days. Simply allow the water to wash over the site and then pat dry. Do not rub the incision. Make sure your axilla (armpit) is completely dry after showering.  Take one aspirin a day for 2 weeks after surgery, unless you have an aspirin sensitivity/allergy or asthma.  Three to 5 times each day you should perform assisted overhead reaching and external rotation (outward turning) exercises with the operative arm. Both exercises should be done with the non-operative arm used as the "therapist arm" while the operative arm remains relaxed. Ten of each exercise should be done three to five times each day.    Overhead reach is helping to lift your stiff arm up as high as it will go. To stretch your overhead reach, lie flat on your back, relax, and grasp the wrist of the tight shoulder with your opposite hand. Using the power in your opposite arm, bring the stiff arm up as far as it is comfortable. Start holding it for ten seconds and then work up to where you can hold it for a count of 30. Breathe slowly and deeply while the arm is moved. Repeat this stretch ten times, trying to help the arm up a little higher each time.       External rotation is turning the arm out to  the side while your elbow stays close to your body. External rotation is best stretched while you are lying on your back. Hold a cane, yardstick, broom handle, or dowel in both hands. Bend both elbows to a right angle. Use steady, gentle force from your normal arm to rotate the hand of the stiff shoulder out away from your body. Continue the rotation as far as it will go comfortably, holding it there for a count of 10. Repeat this exercise ten times.     Please call [phone] during normal business hours or [phone] after hours for any problems. Including the following:  - excessive redness of the incisions - drainage for more than 4 days - fever of more than 101.5 F  *Please note that pain medications will not be refilled after hours or on weekends.    

## 2023-10-25 NOTE — Anesthesia Postprocedure Evaluation (Signed)
 Anesthesia Post Note  Patient: [name]  Procedure(s) Performed: ARTHROSCOPY, SHOULDER, WITH ROTATOR CUFF REPAIR (Right: Shoulder) ARTHROSCOPY, SHOULDER WITH DEBRIDEMENT (Right) DECOMPRESSION, SUBACROMIAL SPACE (Shoulder)     Patient location during evaluation: PACU Anesthesia Type: Regional and General Level of consciousness: awake and alert Pain management: pain level controlled Vital Signs Assessment: post-procedure vital signs reviewed and stable Respiratory status: spontaneous breathing, nonlabored ventilation, respiratory function stable and patient connected to nasal cannula oxygen Cardiovascular status: blood pressure returned to baseline and stable Postop Assessment: no apparent nausea or vomiting Anesthetic complications: no   No notable events documented.  Last Vitals:  Vitals:   10/25/23 1215 10/25/23 1231  BP: (!) 139/109 (!) 172/104  Pulse: 72 64  Resp: 14 14  Temp: 36.5 C 36.6 C  SpO2: 95% 94%    Last Pain:  Vitals:   10/25/23 1215  TempSrc:   PainSc: 2                  Phoebe Marter L Cruise [place]umgardner

## 2023-10-25 NOTE — Anesthesia Procedure Notes (Signed)
 Anesthesia Regional Block: Interscalene brachial plexus block   Pre-Anesthetic Checklist: , timeout performed,  Correct Patient, Correct Site, Correct Laterality,  Correct Procedure, Correct Position, site marked,  Risks and benefits discussed,  Pre-op evaluation,  At surgeon's request and post-op pain management  Laterality: Right  Prep: Maximum Sterile Barrier Precautions used, chloraprep       Needles:  Injection technique: Single-shot  Needle Type: Echogenic Stimulator Needle     Needle Length: 5cm  Needle Gauge: 21     Additional Needles:   Procedures:,,,, ultrasound used (permanent image in chart),,    Narrative:  Start time: 10/25/2023 9:05 AM End time: 10/25/2023 9:09 AM Injection made incrementally with aspirations every 5 mL. Anesthesiologist: Niels Marien CROME, MD

## 2023-10-25 NOTE — Op Note (Signed)
 Procedure(s):   [name] male 73 y.o. 10/25/2023   Preoperative diagnosis: Right shoulder recurrent rotator cuff tear  Postoperative diagnosis: #1 right shoulder irreparable rotator cuff tear #2 right shoulder residual bony impingement  Procedure performed: Arthroscopic extensive debridement recurrent rotator cuff tear involving the subscapularis and supraspinatus with retained suture material. #2 revision arthroscopic subacromial decompression  Procedure(s) and Anesthesia Type:    * ARTHROSCOPY, SHOULDER, WITH ROTATOR CUFF REPAIR - Choice    * ARTHROSCOPY, SHOULDER WITH DEBRIDEMENT - Choice    * DECOMPRESSION, SUBACROMIAL SPACE - General  Surgeons and Role:    DEWAINE Dozier Soulier, MD - Primary     Surgeon: Josefa LELON Dozier   Assistants: Jeoffrey Northern PA-C Amber was present and scrubbed throughout the procedure and was essential in positioning, assisting with the camera and instrumentation,, and closure)  Anesthesia: General endotracheal anesthesia with preoperative interscalene block given by the attending anesthesiologist     Procedure Detail   Estimated Blood Loss: Min         Drains: none  Blood Given: none         Specimens: none        Complications:  * No complications entered in OR log *         Disposition: PACU - hemodynamically stable.         Condition: stable    Procedure:   INDICATIONS FOR SURGERY: The patient is 73 y.o. male who had a rotator cuff repair earlier this year with subsequent reinjury in his postoperative period.  He was found to have recurrent large tear by MRI.  We discussed the possibility of debridement versus revision repair or proceeding with reverse total shoulder replacement.  He elected to proceed with arthroscopic management.  OPERATIVE FINDINGS: Examination under anesthesia: No significant stiffness or instability.   DESCRIPTION OF PROCEDURE: The patient was identified in preoperative  holding area where I  personally marked the operative site after  verifying site, side, and procedure with the patient. An interscalene block was given by the attending anesthesiologist the holding area.  The patient was taken back to the operating room where general anesthesia was induced without complication and was placed in the beach-chair position with the back  elevated about 60 degrees and all extremities and head and neck carefully padded and  positioned.   The right upper extremity was then prepped and  draped in a standard sterile fashion. The appropriate time-out  procedure was carried out. The patient did receive IV antibiotics  within 30 minutes of incision.   A small posterior portal incision was made and the arthroscope was introduced into the joint. An anterior portal was then established above the subscapularis using needle localization. Small cannula was placed anteriorly. Diagnostic arthroscopy was then carried out.  He was noted to have a large articular sided flap tear of the subscapularis which was debrided back to stable tendon.  No formal repair was felt to be necessary here.  There was a small amount of debris in the joint which was debrided.  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  The biceps tendon was absent.  He was noted to have significant Rie tear with notable retraction of the supraspinatus with multiple sutures.  The arthroscope was then introduced into the subacromial space a standard lateral portal was established with needle localization. The shaver was used through the lateral portal to perform extensive bursectomy.   Multiple sutures were debrided and removed with  the shaver.  The tendon edge was noted to be retracted nearly to the level of the glenoid.  It was debrided back to stable tendon and the ArthroCare was used to release above and below the tendon is much as possible.  Once the tendon was maximally mobilized it was still not able to  be adequately reduced over the tuberosity without significant tension.  I did not feel that this was a repairable tear at this point.  Therefore the tendon edge was extensively debrided back to stable tendon.  All sutures were removed.  The undersurface of the acromion was examined and noted to have irregular surface anteriorly which was felt to benefit from smoothing procedure.  The 4 mm bur was used from the lateral portal to smooth the undersurface of the acromion creating a nice type I smooth surface without compromising any of the remaining coracoacromial ligament complex.  The arthroscopic equipment was removed from the joint and the portals were closed with 3-0 nylon in an interrupted fashion. Sterile dressings were then applied including Xeroform 4 x 4's ABDs and tape. The patient was then allowed to awaken from general anesthesia, placed in a sling, transferred to the stretcher and taken to the recovery room in stable condition.   POSTOPERATIVE PLAN: The patient will be discharged home today and will followup in one week for suture removal and wound check.  We will get him back into physical therapy.  If he does not see adequate pain relief with this procedure or has significant dysfunction he may be a candidate for reverse total shoulder replacement in the future.

## 2023-10-25 NOTE — Anesthesia Preprocedure Evaluation (Addendum)
 Anesthesia Evaluation  Patient identified by MRN, date of birth, ID band Patient awake    Reviewed: Allergy & Precautions, NPO status , Patient's Chart, lab work & pertinent test results  Airway Mallampati: II  TM Distance: >3 FB Neck ROM: Full    Dental no notable dental hx. (+) Teeth Intact, Dental Advisory Given   Pulmonary sleep apnea    Pulmonary exam normal breath sounds clear to auscultation       Cardiovascular hypertension, Pt. on medications + Orthopnea  Normal cardiovascular exam+ Valvular Problems/Murmurs (mild AI) AI  Rhythm:Regular Rate:Normal  TTE 2025  1. Left ventricular ejection fraction, by estimation, is 55 to 60%. The  left ventricle has normal function. The left ventricle has no regional  wall motion abnormalities. There is moderate left ventricular hypertrophy.  Left ventricular diastolic  parameters were normal. The average left ventricular global longitudinal  strain is -18.8 %. The global longitudinal strain is normal.   2. Right ventricular systolic function is normal. The right ventricular  size is normal.   3. The mitral valve is abnormal. No evidence of mitral valve  regurgitation. No evidence of mitral stenosis.   4. The aortic valve is tricuspid. There is mild calcification of the  aortic valve. There is mild thickening of the aortic valve. Aortic valve  regurgitation is mild. Aortic valve sclerosis is present, with no evidence  of aortic valve stenosis.   5. The inferior vena cava is dilated in size with >50% respiratory  variability, suggesting right atrial pressure of 8 mmHg.     Neuro/Psych  Headaches PSYCHIATRIC DISORDERS  Depression       GI/Hepatic negative GI ROS,,,(+) Cirrhosis  (NASH)        Endo/Other  diabetes (prediabetes)  Class 3 obesity (BMI 41)  Renal/GU negative Renal ROS  negative genitourinary   Musculoskeletal  (+) Arthritis ,    Abdominal   Peds   Hematology negative hematology ROS (+)   Anesthesia Other Findings   Reproductive/Obstetrics                              Anesthesia Physical Anesthesia Plan  ASA: 3  Anesthesia Plan: General and Regional   Post-op Pain Management: Regional block* and Tylenol  PO (pre-op)*   Induction: Intravenous  PONV Risk Score and Plan: 2 and Dexamethasone , Ondansetron  and Treatment may vary due to age or medical condition  Airway Management Planned: Oral ETT  Additional Equipment:   Intra-op Plan:   Post-operative Plan: Extubation in OR  Informed Consent: I have reviewed the patients History and Physical, chart, labs and discussed the procedure including the risks, benefits and alternatives for the proposed anesthesia with the patient or authorized representative who has indicated his/her understanding and acceptance.     Dental advisory given  Plan Discussed with: CRNA  Anesthesia Plan Comments:          Anesthesia Quick Evaluation

## 2023-10-25 NOTE — H&P (Signed)
 [name] is an 73 y.o. male.   Chief Complaint: R shoulder pain HPI: R shoulder recurrent RCT, failed conservative tx  Past Medical History:  Diagnosis Date   Anemia    Arthritis    Depression 11/02/2019   Hypertension    Liver cirrhosis secondary to NASH (HCC)    Pre-diabetes     Past Surgical History:  Procedure Laterality Date   CARPAL TUNNEL RELEASE Bilateral 03/28/2003   CHOLECYSTECTOMY     laparoscopic   COLON SURGERY  03/27/2001   diverticulitis   COLON SURGERY     JOINT REPLACEMENT Bilateral 03/27/2010   knees   NASAL SEPTOPLASTY W/ TURBINOPLASTY Bilateral 07/03/2022   Procedure: NASAL SEPTOPLASTY WITH TURBINATE REDUCTION;  Surgeon: Jesus Oliphant, MD;  Location: [facility];  Service: ENT;  Laterality: Bilateral;   SHOULDER ARTHROSCOPY Left 06/25/2015   Procedure: ARTHROSCOPY SHOULDER;  Surgeon: Norleen Gavel, MD;  Location: [facility];  Service: Orthopedics;  Laterality: Left;  Left shoulder arthroscopy with subacromial decompression and distal clavicle resection.    SHOULDER OPEN ROTATOR CUFF REPAIR Left 06/25/2015   Procedure: ROTATOR CUFF REPAIR SHOULDER OPEN;  Surgeon: Norleen Gavel, MD;  Location: [facility];  Service: Orthopedics;  Laterality: Left;   TOTAL KNEE REVISION Right 04/25/2021   Procedure: KNEE REVISION two componet quadriceps tendon repair;  Surgeon: Gavel Norleen, MD;  Location: [facility];  Service: Orthopedics;  Laterality: Right;   TOTAL KNEE REVISION Right 07/17/2022   Procedure: RIGHT TOTAL KNEE REVISION;  Surgeon: Liam Lerner, MD;  Location: [facility];  Service: Orthopedics;  Laterality: Right;   TRIGGER FINGER RELEASE Right     History reviewed. No pertinent family history. Social History:  reports that he has never smoked. He has never used smokeless tobacco. He reports current alcohol use. He reports that he does not use drugs.  Allergies:  Allergies  Allergen Reactions   Beta-Lactamase Inhibitors  (Beta-Lactam) Hives, Shortness Of Breath and Swelling    Amoxicillin, ciprofloxacin, penicillin   Spironolactone  Other (See Comments)    Gynecomastia     Medications Prior to Admission  Medication Sig Dispense Refill   losartan  (COZAAR ) 50 MG tablet Take 1 tablet (50 mg total) by mouth daily. 90 tablet 1   traZODone  (DESYREL ) 100 MG tablet Take 100 mg by mouth at bedtime.     aspirin  EC 81 MG tablet Take 1 tablet (81 mg total) by mouth 2 (two) times daily. (Patient not taking: Reported on 07/28/2023) 60 tablet 0   torsemide  (DEMADEX ) 20 MG tablet Take 2 tablets (40 mg total) by mouth daily. (Patient not taking: Reported on 10/17/2023) 180 tablet 0    No results found for this or any previous visit (from the past 48 hours). No results found.  Review of Systems  All other systems reviewed and are negative.   Blood pressure (!) 158/106, pulse 79, temperature 98.3 F (36.8 C), temperature source Oral, resp. rate 16, height 5' 7 (1.702 m), weight 118.8 kg, SpO2 94%. Physical Exam Constitutional:      Appearance: He is well-developed.  HENT:     Head: Atraumatic.  Eyes:     Extraocular Movements: Extraocular movements intact.  Cardiovascular:     Pulses: Normal pulses.  Pulmonary:     Effort: Pulmonary effort is normal.  Musculoskeletal:     Comments: R shoulder pain with RC testing  Skin:    General: Skin is warm and dry.  Neurological:     Mental Status: He  is alert and oriented to person, place, and time.  Psychiatric:        Mood and Affect: Mood normal.      Assessment/Plan R shoulder recurrent RCT, failed conservative tx Plan R revision RCR Risks / benefits of surgery discussed Consent on chart  NPO for OR Preop antibiotics   Josefa LELON Herring, MD 10/25/2023, 9:09 AM

## 2023-10-25 NOTE — Anesthesia Procedure Notes (Signed)
 Procedure Name: Intubation Date/Time: 10/25/2023 9:55 AM  Performed by: Kathern Rollene LABOR, CRNAPre-anesthesia Checklist: Patient identified, Emergency Drugs available, Suction available and Patient being monitored Patient Re-evaluated:Patient Re-evaluated prior to induction Oxygen Delivery Method: Circle system utilized Preoxygenation: Pre-oxygenation with 100% oxygen Induction Type: IV induction Ventilation: Mask ventilation without difficulty Laryngoscope Size: Glidescope Grade View: Grade II Tube type: Oral Tube size: 7.5 mm Number of attempts: 1 Airway Equipment and Method: Stylet Placement Confirmation: ETT inserted through vocal cords under direct vision, positive ETCO2 and breath sounds checked- equal and bilateral Secured at: 23 cm Tube secured with: Tape Dental Injury: Teeth and Oropharynx as per pre-operative assessment  Difficulty Due To: Difficulty was anticipated, Difficult Airway- due to large tongue and Difficult Airway- due to reduced neck mobility

## 2023-10-26 ENCOUNTER — Encounter (HOSPITAL_COMMUNITY): Payer: Self-pay | Admitting: Orthopedic Surgery

## 2023-11-05 NOTE — Therapy (Incomplete)
 OUTPATIENT PHYSICAL THERAPY UPPER EXTREMITY EVALUATION   Patient Name: [name] MRN: [id number] DOB:1950/09/01, 73 y.o., male Today's Date: 11/05/2023  END OF SESSION:   Past Medical History:  Diagnosis Date   Anemia    Arthritis    Depression 11/02/2019   Hypertension    Liver cirrhosis secondary to NASH [facility])    Pre-diabetes    Past Surgical History:  Procedure Laterality Date   CARPAL TUNNEL RELEASE Bilateral 03/28/2003   CHOLECYSTECTOMY     laparoscopic   COLON SURGERY  03/27/2001   diverticulitis   COLON SURGERY     JOINT REPLACEMENT Bilateral 03/27/2010   knees   NASAL SEPTOPLASTY W/ TURBINOPLASTY Bilateral 07/03/2022   Procedure: NASAL SEPTOPLASTY WITH TURBINATE REDUCTION;  Surgeon: Jesus Oliphant, MD;  Location: [facility];  Service: ENT;  Laterality: Bilateral;   POSTERIOR LUMBAR FUSION 2 WITH HARDWARE REMOVAL Right 10/25/2023   Procedure: ARTHROSCOPY, SHOULDER WITH DEBRIDEMENT;  Surgeon: Dozier Soulier, MD;  Location: [facility];  Service: Orthopedics;  Laterality: Right;   SHOULDER ARTHROSCOPY Left 06/25/2015   Procedure: ARTHROSCOPY SHOULDER;  Surgeon: Norleen Gavel, MD;  Location: [facility];  Service: Orthopedics;  Laterality: Left;  Left shoulder arthroscopy with subacromial decompression and distal clavicle resection.    SHOULDER ARTHROSCOPY WITH ROTATOR CUFF REPAIR Right 10/25/2023   Procedure: ARTHROSCOPY, SHOULDER, WITH ROTATOR CUFF REPAIR;  Surgeon: Dozier Soulier, MD;  Location: [facility];  Service: Orthopedics;  Laterality: Right;   SHOULDER OPEN ROTATOR CUFF REPAIR Left 06/25/2015   Procedure: ROTATOR CUFF REPAIR SHOULDER OPEN;  Surgeon: Norleen Gavel, MD;  Location:  [facility];  Service: Orthopedics;  Laterality: Left;   SUBACROMIAL DECOMPRESSION  10/25/2023   Procedure: DECOMPRESSION, SUBACROMIAL SPACE;  Surgeon: Dozier Soulier, MD;  Location: [facility];  Service: Orthopedics;;   TOTAL KNEE REVISION Right  04/25/2021   Procedure: KNEE REVISION two componet quadriceps tendon repair;  Surgeon: Gavel Norleen, MD;  Location: [facility];  Service: Orthopedics;  Laterality: Right;   TOTAL KNEE REVISION Right 07/17/2022   Procedure: RIGHT TOTAL KNEE REVISION;  Surgeon: Liam Lerner, MD;  Location: [facility];  Service: Orthopedics;  Laterality: Right;   TRIGGER FINGER RELEASE Right    Patient Active Problem List   Diagnosis Date Noted   Lower extremity edema 07/28/2023   Chronic low back pain 07/27/2023   Shortness of breath 07/27/2023   Orthopnea 07/27/2023   Respiratory distress 07/27/2023   Liver cirrhosis (HCC) 07/27/2023   Morbid obesity (HCC) 07/18/2022   Prediabetes 07/18/2022   S/P revision of total knee, 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  Rationale for Evaluation and Treatment: Rehabilitation  ONSET DATE: 10/25/23  SUBJECTIVE:  SUBJECTIVE STATEMENT: *** Hand dominance: {MISC; OT HAND [phone]}  PERTINENT HISTORY: Rt RCR 10/25/23 Lt RCR 06/25/15   PAIN:  Are you having pain? Yes: NPRS scale: *** Pain location: *** Pain description: *** Aggravating factors: *** Relieving factors: ***  PRECAUTIONS: {Therapy precautions:24002}  RED FLAGS: {PT Red Flags:29287}   WEIGHT BEARING RESTRICTIONS: {Yes ***/No:24003}  FALLS:  Has patient fallen in last 6 months? {fallsyesno:27318}  LIVING  ENVIRONMENT: Lives with: {[facility] lives with:25569::lives with their family} Lives in: {Lives in:25570} Stairs: {opstairs:27293} Has following equipment at home: {Assistive devices:23999}  OCCUPATION: ***  PLOF: {PLOF:24004}  PATIENT GOALS: ***  NEXT MD VISIT: ***  OBJECTIVE:  Note: Objective measures were completed at Evaluation unless otherwise noted.  DIAGNOSTIC FINDINGS:  ***  PATIENT SURVEYS :  {rehab surveys:24030:a}  COGNITION: Overall cognitive status: {cognition:24006}     SENSATION: {sensation:27233}  POSTURE: ***  UPPER EXTREMITY ROM:   {AROM/PROM:27142} ROM Right eval Left eval  Shoulder flexion    Shoulder extension    Shoulder abduction    Shoulder adduction    Shoulder internal rotation    Shoulder external rotation    Elbow flexion    Elbow extension    Wrist flexion    Wrist extension    Wrist ulnar deviation    Wrist radial deviation    Wrist pronation    Wrist supination    (Blank rows = not tested)  UPPER EXTREMITY MMT:  MMT Right eval Left eval  Shoulder flexion    Shoulder extension    Shoulder abduction    Shoulder adduction    Shoulder internal rotation    Shoulder external rotation    Middle trapezius    Lower trapezius    Elbow flexion    Elbow extension    Wrist flexion    Wrist extension    Wrist ulnar deviation    Wrist radial deviation    Wrist pronation    Wrist supination    Grip strength (lbs)    (Blank rows = not tested)  SHOULDER SPECIAL TESTS: Impingement tests: {shoulder impingement test:25231:a} SLAP lesions: {SLAP lesions:25232} Instability tests: {shoulder instability test:25233} Rotator cuff assessment: {rotator cuff assessment:25234} Biceps assessment: {biceps assessment:25235}  JOINT MOBILITY TESTING:  ***  PALPATION:  ***                                                                                                                             TREATMENT DATE: ***   PATIENT  EDUCATION: Education details: *** Person educated: {Person educated:25204} Education method: {Education Method:25205} Education comprehension: {Education Comprehension:25206}  HOME EXERCISE PROGRAM: ***  ASSESSMENT:  CLINICAL IMPRESSION: Patient is a *** y.o. *** who was seen today for physical therapy evaluation and treatment for ***.    OBJECTIVE IMPAIRMENTS: {opptimpairments:25111}.   ACTIVITY LIMITATIONS: {activitylimitations:27494}  PARTICIPATION LIMITATIONS: {participationrestrictions:25113}  PERSONAL FACTORS: {Personal factors:25162} are also affecting patient's functional outcome.   REHAB POTENTIAL: {rehabpotential:25112}  CLINICAL DECISION MAKING: {clinical decision making:25114}  EVALUATION COMPLEXITY: {Evaluation complexity:25115}  GOALS: Goals reviewed with patient? {yes/no:20286}  SHORT TERM GOALS: Target date: ***  *** Baseline: Goal status: {GOALSTATUS:25110}  2.  *** Baseline:  Goal status: {GOALSTATUS:25110}  3.  *** Baseline:  Goal status: {GOALSTATUS:25110}  4.  *** Baseline:  Goal status: {GOALSTATUS:25110}  5.  *** Baseline:  Goal status: {GOALSTATUS:25110}  6.  *** Baseline:  Goal status: {GOALSTATUS:25110}  LONG TERM GOALS: Target date: ***  *** Baseline:  Goal status: {GOALSTATUS:25110}  2.  *** Baseline:  Goal status: {GOALSTATUS:25110}  3.  *** Baseline:  Goal status: {GOALSTATUS:25110}  4.  *** Baseline:  Goal status: {GOALSTATUS:25110}  5.  *** Baseline:  Goal status: {GOALSTATUS:25110}  6.  *** Baseline:  Goal status: {GOALSTATUS:25110}  PLAN: PT FREQUENCY: {rehab frequency:25116}  PT DURATION: {rehab duration:25117}  PLANNED INTERVENTIONS: {rehab planned interventions:25118::97110-Therapeutic exercises,97530- Therapeutic [phone]- Neuromuscular re-education,97535- Self Rjmz,02859- Manual therapy}  PLAN FOR NEXT SESSION: ***  Ahmani Daoud, PT, DPT, ATC 11/05/23 2:35 PM

## 2023-11-06 ENCOUNTER — Ambulatory Visit

## 2023-11-06 NOTE — Therapy (Signed)
 OUTPATIENT PHYSICAL THERAPY SHOULDER EVALUATION   Patient Name: [name] MRN: [id number] DOB:1951-01-16, 73 y.o., male Today's Date: 11/07/2023  END OF SESSION:  PT End of Session - 11/07/23 1212     Visit Number 1    Number of Visits 16    Date for PT Re-Evaluation 12/19/23    Authorization Type [employer] medicare copay $50    PT Start Time 1100    PT Stop Time 1148    PT Time Calculation (min) 48 min    Activity Tolerance Patient tolerated treatment well          Past Medical History:  Diagnosis Date   Anemia    Arthritis    Depression 11/02/2019   Hypertension    Liver cirrhosis secondary to NASH (HCC)    Pre-diabetes    Past Surgical History:  Procedure Laterality Date   CARPAL TUNNEL RELEASE Bilateral 03/28/2003   CHOLECYSTECTOMY     laparoscopic   COLON SURGERY  03/27/2001   diverticulitis   COLON SURGERY     JOINT REPLACEMENT Bilateral 03/27/2010   knees   NASAL SEPTOPLASTY W/ TURBINOPLASTY Bilateral 07/03/2022   Procedure: NASAL SEPTOPLASTY WITH TURBINATE REDUCTION;  Surgeon: Jesus Oliphant, MD;  Location: [facility];  Service: ENT;  Laterality: Bilateral;   POSTERIOR LUMBAR FUSION 2 WITH HARDWARE REMOVAL Right 10/25/2023   Procedure: ARTHROSCOPY, SHOULDER WITH DEBRIDEMENT;  Surgeon: Dozier Soulier, MD;  Location: [facility];  Service: Orthopedics;  Laterality: Right;   SHOULDER ARTHROSCOPY Left 06/25/2015   Procedure: ARTHROSCOPY SHOULDER;  Surgeon: Norleen Gavel, MD;  Location: [facility];  Service: Orthopedics;  Laterality: Left;  Left shoulder arthroscopy with subacromial decompression and distal clavicle resection.    SHOULDER ARTHROSCOPY WITH ROTATOR CUFF REPAIR Right 10/25/2023   Procedure: ARTHROSCOPY, SHOULDER, WITH ROTATOR CUFF REPAIR;  Surgeon: Dozier Soulier, MD;  Location: [facility];  Service: Orthopedics;  Laterality: Right;   SHOULDER OPEN ROTATOR CUFF REPAIR Left 06/25/2015   Procedure: ROTATOR CUFF REPAIR SHOULDER OPEN;   Surgeon: Norleen Gavel, MD;  Location:  [facility];  Service: Orthopedics;  Laterality: Left;   SUBACROMIAL DECOMPRESSION  10/25/2023   Procedure: DECOMPRESSION, SUBACROMIAL SPACE;  Surgeon: Dozier Soulier, MD;  Location: [facility];  Service: Orthopedics;;   TOTAL KNEE REVISION Right 04/25/2021   Procedure: KNEE REVISION two componet quadriceps tendon repair;  Surgeon: Gavel Norleen, MD;  Location: [facility];  Service: Orthopedics;  Laterality: Right;   TOTAL KNEE REVISION Right 07/17/2022   Procedure: RIGHT TOTAL KNEE REVISION;  Surgeon: Liam Lerner, MD;  Location: [facility];  Service: Orthopedics;  Laterality: Right;   TRIGGER FINGER RELEASE Right    Patient Active Problem List   Diagnosis Date Noted   Lower extremity edema 07/28/2023   Chronic low back pain 07/27/2023   Shortness of breath 07/27/2023   Orthopnea 07/27/2023   Respiratory distress 07/27/2023   Liver cirrhosis (HCC) 07/27/2023   Morbid obesity (HCC) 07/18/2022   Prediabetes 07/18/2022   S/P revision of total knee, 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/2022   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) 04/25/2021   Failed total knee, 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   Migraine 09/10/2020   Sleep apnea 09/10/2020   Benign prostatic hyperplasia 09/10/2020   Hypertension 11/02/2019   Depression 11/02/2019   Thrombocytopenia (HCC) 11/02/2019    PCP: Dr Wilburn Cannon  REFERRING PROVIDER: Dr Soulier Dozier   REFERRING DIAG: R shoulder scope, debridement, SAD   THERAPY DIAG:  Acute pain of right shoulder - Plan:  PT plan of care cert/re-cert  Other symptoms and signs involving the musculoskeletal system - Plan: PT plan of care cert/re-cert  Muscle weakness (generalized) - Plan: PT plan of care cert/re-cert  Abnormal posture - Plan: PT plan of care cert/re-cert  Rationale for Evaluation and Treatment: Rehabilitation  ONSET DATE: 10/25/23  SUBJECTIVE:                                                                                                                                                                                       SUBJECTIVE STATEMENT: Patient reports that he has had R shoulder pain since 9/24 when he noticed pain with raising his arm. He was diagnosed with torn rotator cuff and underwent surgery 04/11/23 with rotator cuff repair. Two of three tendons were subsequently torn. He underwent arthroscopic surgery 10/25/23 to attempt to repair the tendons but were unsuccessful at tendon repair. Out of sling since 11/02/23. He has no precautions per MD per patient.  Hand dominance: Right  PERTINENT HISTORY: HTN; bilat TKA L 2012; TKA R 2012 x 2 with 3rd surgery with repeat R TKA 2021; L RCR ~ 3 yrs ago; Carpal tunnel surgery bilat ~ 20 yrs ago; obesity   PAIN:  Are you having pain? Yes: NPRS scale: 7-8/10 with movement; 2/10 at rest arm supported  Pain location: R shoulder  Pain description: sharp  Aggravating factors: moving R arm  Relieving factors: ice; rest   PRECAUTIONS: Shoulder  RED FLAGS: None   WEIGHT BEARING RESTRICTIONS: Yes UE NWB  FALLS:  Has patient fallen in last 6 months? No  LIVING ENVIRONMENT: Lives with: lives with their spouse Lives in: House/apartment Stairs: Yes: Internal: 11 steps; can reach both and External: 7 steps; on left going up Has following equipment at home: None  OCCUPATION: Retired from Scientist, research (life sciences) - retired ~ 5 yrs ago; Mudlogger; painting; household chores; travel; golf has not played in > 1 year; picker ball has not played in ~ 1 year   PLOF: Independent  PATIENT GOALS:get movement back in the arm and get it moving as close to normal as possible   NEXT MD VISIT: 12/03/23  OBJECTIVE:  Note: Objective measures were completed at Evaluation unless otherwise noted.  DIAGNOSTIC FINDINGS:  MRI 10/04/23: FINDINGS: Rotator cuff: Prior rotator cuff repair with postsurgical changes. Complete full-thickness, full width tear of the  supraspinatus tendon with 2.4 cm of retraction. Severe infraspinatus tendinosis. Teres minor tendon is intact. Severe subscapularis tendinosis.   Muscles: No muscle atrophy or edema. No intramuscular fluid collection or hematoma.   Biceps Long Head: Prior tenodesis of the proximal long head of the biceps tendon with  adjacent moderate tendinosis.   Acromioclavicular Joint: Severe arthropathy of the acromioclavicular joint. Large amount of contrast in the subacromial/subdeltoid bursa communicating with the [facility] joint and glenohumeral joint.   Glenohumeral Joint: Contrast in the glenohumeral joint distending the joint capsule. Normal glenohumeral ligaments. No chondral defect.   Labrum: Intact   Bones: Prior superior labral debridement.   Other: No fluid collection or hematoma.   IMPRESSION: 1. Prior rotator cuff repair with postsurgical changes. Complete full-thickness, full width tear of the supraspinatus tendon with 2.4 cm of retraction. 2. Severe infraspinatus and subscapularis tendinosis. 3. Prior tenodesis of the proximal long head of the biceps tendon with adjacent moderate tendinosis.  PATIENT SURVEYS:  PSFS: THE PATIENT SPECIFIC FUNCTIONAL SCALE  Place score of 0-10 (0 = unable to perform activity and 10 = able to perform activity at the same level as before injury or problem)  Activity Date: 11/07/23    Reaching behind body  0    2. Reaching above 100 degrees  2    3. Sleeping on R side  0    4. Combing hair  4    Total Score 6      Total Score = Sum of activity scores/number of activities 6/4 = 1.5   Minimally Detectable Change: 3 points (for single activity); 2 points (for average score)  [facility] (nd). The Patient Specific Functional Scale . Retrieved from [email or web address]   COGNITION: Overall cognitive status: Within functional limits for tasks  assessed     SENSATION: WFL  POSTURE: Patient presents with head forward posture with increased thoracic kyphosis; shoulders rounded and elevated; scapulae abducted and rotated along the thoracic spine; head of the humerus anterior in orientation.   UPPER EXTREMITY ROM: pain with all AROM R shoulder   Active ROM Right eval Left eval  Shoulder flexion 123 144  Shoulder extension 54 73  Shoulder abduction 87 143  Shoulder adduction    Shoulder internal rotation waist T7  Shoulder external rotation 32 52  Elbow flexion    Elbow extension    Wrist flexion    Wrist extension    Wrist ulnar deviation    Wrist radial deviation    Wrist pronation    Wrist supination    (Blank rows = not tested)  UPPER EXTREMITY MMT: Not assessed resistively; poor postural strength noted in posterior shoulder girdle   MMT Right eval Left eval  Shoulder flexion    Shoulder extension    Shoulder abduction    Shoulder adduction    Shoulder internal rotation    Shoulder external rotation    Middle trapezius    Lower trapezius    Elbow flexion    Elbow extension    Wrist flexion    Wrist extension    Wrist ulnar deviation    Wrist radial deviation    Wrist pronation    Wrist supination    Grip strength (lbs)    (Blank rows = not tested)  PALPATION:  Muscular tightness through R shoulder girdle through the pecs; upper traps; leveator; biceps; deltoid; teres Tight through thoracic spine  TREATMENT DATE: 11/07/23 eval; HEP    PATIENT EDUCATION: Education details: POC; HEP  Person educated: Patient Education method: Programmer, multimedia, Demonstration, Actor cues, Verbal cues, and Handouts Education comprehension: verbalized understanding, returned demonstration, verbal cues required, tactile cues required, and needs further education  HOME EXERCISE PROGRAM: Access  Code: [facility] URL: [email or web address] Date: 11/07/2023 Prepared by: Cannon Quinton  Exercises - Seated Shoulder Flexion AAROM with Pulley Behind  - 2 x daily - 7 x weekly - 1 sets - 10 reps - 10 sec  hold - Seated Shoulder Scaption AAROM with Pulley at Side  - 2 x daily - 7 x weekly - 1 sets - 10 reps - 10sec  hold - Seated Scapular Retraction  - 2 x daily - 7 x weekly - 1-2 sets - 10 reps - 10 sec  hold - Standing Anatomical Position with Scapular Retraction and Depression at Wall  - 2 x daily - 7 x weekly - 1 sets - 5-10 reps - 5-10 sec  hold - Seated Cervical Retraction  - 2 x daily - 7 x weekly - 1-2 sets - 5-10 reps - 10 sec  hold - Seated Cervical Sidebending AROM  - 2 x daily - 7 x weekly - 1 sets - 3-5 reps - 5-10 sec  hold - Standing Piriformis Release with Ball at Wall  - 2 x daily - 7 x weekly - 30-60 sec  hold - Standing Infraspinatus/Teres Minor Release with Ball at [employer]  - 2 x daily - 7 x weekly  ASSESSMENT:  CLINICAL IMPRESSION: Patient is a 73 y.o. male who was seen today for physical therapy evaluation and treatment s/p R shoulder scope with debridement, SAD 10/25/23. Patient had initial RCR 04/11/23 with failure of tendon repair. Patient does not recall re-injury of shoulder. Patient reports that Dr Dozier was unable to repair tendons and feels Darina will need a TSA in the future. Patient presents with limited ROM; decreased strength; poor posture and alignment; pain with functional activities; muscular tightness to palpation R UE. He will benefit from PT to address problems identified.   OBJECTIVE IMPAIRMENTS: decreased activity tolerance, decreased mobility, decreased ROM, decreased strength, increased edema, increased fascial restrictions, impaired UE functional use, improper body mechanics, postural dysfunction, and pain.   ACTIVITY LIMITATIONS: carrying, lifting, bending, sitting, sleeping, toileting, dressing, reach over head, and  hygiene/grooming  PARTICIPATION LIMITATIONS: meal prep, cleaning, laundry, driving, shopping, community activity, and yard work  PERSONAL FACTORS: Past/current experiences and Time since onset of injury/illness/exacerbation are also affecting patient's functional outcome.   REHAB POTENTIAL: Good  CLINICAL DECISION MAKING: Evolving/moderate complexity  EVALUATION COMPLEXITY: Moderate   GOALS: Goals reviewed with patient? Yes  SHORT TERM GOALS: Target date: 11/28/2023   Independent in initial HEP  Baseline: Goal status: INITIAL  2.  Increase AROM R shoulder to without 10-15 degrees of AROM L shoulder  Baseline:  Goal status: INITIAL  3.  Decrease pain with raising arm by 50% allowing patient to use R UE for increased functional activities  Baseline:  Goal status: INITIAL   LONG TERM GOALS: Target date: 12/19/2023    Decrease R shoulder pain by 75% allowing patient to precede with normal functional activities  Baseline:  Goal status: INITIAL  2.  Patient demonstrates ability to reach behind back to fasten seatbelt in car  Baseline:  Goal status: INITIAL  3.  Patient demonstrates ability to raise R arm to > 120 degrees to improve functional use of R UE  Baseline:  Goal  status: INITIAL  4.  Patient reports ability to use R UE for grooming and hygiene  Baseline:  Goal status: INITIAL  5.  Improve PSFS: THE PATIENT SPECIFIC FUNCTIONAL SCALE score by 2 points   Place score of 0-10 (0 = unable to perform activity and 10 = able to perform activity at the same level as before injury or problem)  Activity Date: 11/07/23    Reaching behind body  0    2. Reaching above 100 degrees  2    3. Sleeping on R side  0    4. Combing hair  4    Total Score 6      Total Score = Sum of activity scores/number of activities 6/4 = 1.5  Baseline: 1.5  Goal status: INITIAL  6.  Independent in advanced HEP  Baseline:  Goal status: INITIAL  PLAN:  PT FREQUENCY: 2x/week  PT  DURATION: 8 weeks  PLANNED INTERVENTIONS: 97164- PT Re-evaluation, 97110-Therapeutic exercises, 97530- Therapeutic activity, 97112- Neuromuscular re-education, 97535- Self Care, [id number]- Manual therapy, Patient/Family education, Taping, and Joint mobilization  PLAN FOR NEXT SESSION: review and progress with exercises; education and post op precautions; manual work and modalities as indicated    [employer], PT 11/07/2023, 12:42 PM

## 2023-11-07 ENCOUNTER — Ambulatory Visit: Attending: Orthopedic Surgery | Admitting: Rehabilitative and Restorative Service Providers"

## 2023-11-07 ENCOUNTER — Other Ambulatory Visit: Payer: Self-pay

## 2023-11-07 ENCOUNTER — Encounter: Payer: Self-pay | Admitting: Rehabilitative and Restorative Service Providers"

## 2023-11-07 DIAGNOSIS — M6281 Muscle weakness (generalized): Secondary | ICD-10-CM | POA: Insufficient documentation

## 2023-11-07 DIAGNOSIS — M25511 Pain in right shoulder: Secondary | ICD-10-CM | POA: Insufficient documentation

## 2023-11-07 DIAGNOSIS — R29898 Other symptoms and signs involving the musculoskeletal system: Secondary | ICD-10-CM | POA: Insufficient documentation

## 2023-11-07 DIAGNOSIS — R293 Abnormal posture: Secondary | ICD-10-CM | POA: Diagnosis not present

## 2023-11-13 ENCOUNTER — Ambulatory Visit: Admitting: Rehabilitative and Restorative Service Providers"

## 2023-11-13 ENCOUNTER — Encounter: Payer: Self-pay | Admitting: Rehabilitative and Restorative Service Providers"

## 2023-11-13 DIAGNOSIS — M6281 Muscle weakness (generalized): Secondary | ICD-10-CM | POA: Diagnosis not present

## 2023-11-13 DIAGNOSIS — R293 Abnormal posture: Secondary | ICD-10-CM | POA: Diagnosis not present

## 2023-11-13 DIAGNOSIS — R29898 Other symptoms and signs involving the musculoskeletal system: Secondary | ICD-10-CM | POA: Diagnosis not present

## 2023-11-13 DIAGNOSIS — M25511 Pain in right shoulder: Secondary | ICD-10-CM

## 2023-11-13 NOTE — Therapy (Signed)
 OUTPATIENT PHYSICAL THERAPY SHOULDER EVALUATION   Patient Name: [name] MRN: [id number] DOB:[birth year], 73 y.o., male Today's Date: 11/13/2023  END OF SESSION:  PT End of Session - 11/13/23 1056     Visit Number 2    Number of Visits 16    Date for PT Re-Evaluation 12/19/23    Authorization Type [employer] medicare copay $50 auth required    PT Start Time 1056    PT Stop Time 1145    PT Time Calculation (min) 49 min          Past Medical History:  Diagnosis Date   Anemia    Arthritis    Depression 11/02/2019   Hypertension    Liver cirrhosis secondary to NASH (HCC)    Pre-diabetes    Past Surgical History:  Procedure Laterality Date   CARPAL TUNNEL RELEASE Bilateral 03/28/2003   CHOLECYSTECTOMY     laparoscopic   COLON SURGERY  03/27/2001   diverticulitis   COLON SURGERY     JOINT REPLACEMENT Bilateral 03/27/2010   knees   NASAL SEPTOPLASTY W/ TURBINOPLASTY Bilateral 07/03/2022   Procedure: NASAL SEPTOPLASTY WITH TURBINATE REDUCTION;  Surgeon: Jesus Oliphant, MD;  Location: [facility];  Service: ENT;  Laterality: Bilateral;   POSTERIOR LUMBAR FUSION 2 WITH HARDWARE REMOVAL Right 10/25/2023   Procedure: ARTHROSCOPY, SHOULDER WITH DEBRIDEMENT;  Surgeon: Dozier Soulier, MD;  Location: [facility];  Service: Orthopedics;  Laterality: Right;   SHOULDER ARTHROSCOPY Left 06/25/2015   Procedure: ARTHROSCOPY SHOULDER;  Surgeon: Norleen Gavel, MD;  Location: [facility];  Service: Orthopedics;  Laterality: Left;  Left shoulder arthroscopy with subacromial decompression and distal clavicle resection.    SHOULDER ARTHROSCOPY WITH ROTATOR CUFF REPAIR Right 10/25/2023   Procedure: ARTHROSCOPY, SHOULDER, WITH ROTATOR CUFF REPAIR;  Surgeon: Dozier Soulier, MD;  Location: [facility];  Service: Orthopedics;  Laterality: Right;   SHOULDER OPEN ROTATOR CUFF REPAIR Left 06/25/2015   Procedure: ROTATOR CUFF REPAIR SHOULDER OPEN;  Surgeon: Norleen Gavel, MD;  Location:  [facility];  Service: Orthopedics;  Laterality: Left;   SUBACROMIAL DECOMPRESSION  10/25/2023   Procedure: DECOMPRESSION, SUBACROMIAL SPACE;  Surgeon: Dozier Soulier, MD;  Location: [facility];  Service: Orthopedics;;   TOTAL KNEE REVISION Right 04/25/2021   Procedure: KNEE REVISION two componet quadriceps tendon repair;  Surgeon: Gavel Norleen, MD;  Location: [facility];  Service: Orthopedics;  Laterality: Right;   TOTAL KNEE REVISION Right 07/17/2022   Procedure: RIGHT TOTAL KNEE REVISION;  Surgeon: Liam Lerner, MD;  Location: [facility];  Service: Orthopedics;  Laterality: Right;   TRIGGER FINGER RELEASE Right    Patient Active Problem List   Diagnosis Date Noted   Lower extremity edema 07/28/2023   Chronic low back pain 07/27/2023   Shortness of breath 07/27/2023   Orthopnea 07/27/2023   Respiratory distress 07/27/2023   Liver cirrhosis (HCC) 07/27/2023   Morbid obesity (HCC) 07/18/2022   Prediabetes 07/18/2022   S/P revision of total knee, 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/2022   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) 04/25/2021   Failed total knee, 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   Migraine 09/10/2020   Sleep apnea 09/10/2020   Benign prostatic hyperplasia 09/10/2020   Hypertension 11/02/2019   Depression 11/02/2019   Thrombocytopenia (HCC) 11/02/2019    PCP: Dr Wilburn Cannon  REFERRING PROVIDER: Dr Soulier Dozier   REFERRING DIAG: R shoulder scope, debridement, SAD   THERAPY DIAG:  Acute pain of right shoulder  Other symptoms and signs involving the musculoskeletal system  Muscle weakness (generalized)  Abnormal posture  Rationale for Evaluation and Treatment: Rehabilitation  ONSET DATE: 10/25/23  SUBJECTIVE:                                                                                                                                                                                      SUBJECTIVE STATEMENT: Feels like his shoulder is popping will move in  a different way and shoulder will hurt. He then gets it to pop and it feels okay again. He is working on his exercises. Got a pulley for home.   Eval: Patient reports that he has had R shoulder pain since 9/24 when he noticed pain with raising his arm. He was diagnosed with torn rotator cuff and underwent surgery 04/11/23 with rotator cuff repair. Two of three tendons were subsequently torn. He underwent arthroscopic surgery 10/25/23 to attempt to repair the tendons but were unsuccessful at tendon repair. Out of sling since 11/02/23. He has no precautions per MD per patient.  Hand dominance: Right  PERTINENT HISTORY: HTN; bilat TKA L 2012; TKA R 2012 x 2 with 3rd surgery with repeat R TKA 2021; L RCR ~ 3 yrs ago; Carpal tunnel surgery bilat ~ 20 yrs ago; obesity   PAIN:  Are you having pain? Yes: NPRS scale: 0/10 with movement; 2/10 at rest arm supported  Pain location: R shoulder  Pain description: sharp  Aggravating factors: moving R arm  Relieving factors: ice; rest   PRECAUTIONS: Shoulder - no precautions per pt report and per MD order - (failed RCR)    WEIGHT BEARING RESTRICTIONS: Yes UE NWB  FALLS:  Has patient fallen in last 6 months? No  LIVING ENVIRONMENT: Lives with: lives with their spouse Lives in: House/apartment Stairs: Yes: Internal: 11 steps; can reach both and External: 7 steps; on left going up Has following equipment at home: None  OCCUPATION: Retired from Scientist, research (life sciences) - retired ~ 5 yrs ago; Mudlogger; painting; household chores; travel; golf has not played in > 1 year; picker ball has not played in ~ 1 year   PLOF: Independent  PATIENT GOALS:get movement back in the arm and get it moving as close to normal as possible   NEXT MD VISIT: 12/03/23  OBJECTIVE:  Note: Objective measures were completed at Evaluation unless otherwise noted.  DIAGNOSTIC FINDINGS:  MRI 10/04/23: FINDINGS: Rotator cuff: Prior rotator cuff repair with postsurgical  changes. Complete full-thickness, full width tear of the supraspinatus tendon with 2.4 cm of retraction. Severe infraspinatus tendinosis. Teres minor tendon is intact. Severe subscapularis tendinosis.   Muscles: No muscle atrophy or edema. No intramuscular fluid collection or hematoma.  Biceps Long Head: Prior tenodesis of the proximal long head of the biceps tendon with adjacent moderate tendinosis.   Acromioclavicular Joint: Severe arthropathy of the acromioclavicular joint. Large amount of contrast in the subacromial/subdeltoid bursa communicating with the [facility] joint and glenohumeral joint.   Glenohumeral Joint: Contrast in the glenohumeral joint distending the joint capsule. Normal glenohumeral ligaments. No chondral defect.   Labrum: Intact   Bones: Prior superior labral debridement.   Other: No fluid collection or hematoma.   IMPRESSION: 1. Prior rotator cuff repair with postsurgical changes. Complete full-thickness, full width tear of the supraspinatus tendon with 2.4 cm of retraction. 2. Severe infraspinatus and subscapularis tendinosis. 3. Prior tenodesis of the proximal long head of the biceps tendon with adjacent moderate tendinosis.  PATIENT SURVEYS:  PSFS: THE PATIENT SPECIFIC FUNCTIONAL SCALE  Place score of 0-10 (0 = unable to perform activity and 10 = able to perform activity at the same level as before injury or problem)  Activity Date: 11/07/23    Reaching behind body  0    2. Reaching above 100 degrees  2    3. Sleeping on R side  0    4. Combing hair  4    Total Score 6      Total Score = Sum of activity scores/number of activities 6/4 = 1.5   Minimally Detectable Change: 3 points (for single activity); 2 points (for average score)  [facility] (nd). The Patient Specific Functional Scale . Retrieved from [email or web address]    POSTURE: Patient presents with head forward posture  with increased thoracic kyphosis; shoulders rounded and elevated; scapulae abducted and rotated along the thoracic spine; head of the humerus anterior in orientation.   UPPER EXTREMITY ROM: pain with all AROM R shoulder   Active ROM Right eval Left eval  Shoulder flexion 123 144  Shoulder extension 54 73  Shoulder abduction 87 143  Shoulder adduction    Shoulder internal rotation waist T7  Shoulder external rotation 32 52  Elbow flexion    Elbow extension    Wrist flexion    Wrist extension    Wrist ulnar deviation    Wrist radial deviation    Wrist pronation    Wrist supination    (Blank rows = not tested)  UPPER EXTREMITY MMT: Not assessed resistively; poor postural strength noted in posterior shoulder girdle   MMT Right eval Left eval  Shoulder flexion    Shoulder extension    Shoulder abduction    Shoulder adduction    Shoulder internal rotation    Shoulder external rotation    Middle trapezius    Lower trapezius    Elbow flexion    Elbow extension    Wrist flexion    Wrist extension    Wrist ulnar deviation    Wrist radial deviation    Wrist pronation    Wrist supination    Grip strength (lbs)    (Blank rows = not tested)  PALPATION:  Muscular tightness through R shoulder girdle through the pecs; upper traps; leveator; biceps; deltoid; teres Tight through thoracic spine  [facility] Adult PT Treatment:                                                DATE: 11/13/23 Therapeutic Exercise: Pulley flexion 10 sec x 10  Pulley scaption 10 sec x 10  Pulley horizontal abduction in ~ 70 deg abduction x 10  Manual Therapy: STM; PROM/AAROM R shoulder patient sitting - flexion; scaption; ER within tissue limits no pain  Joint mobilization using towel  Neuromuscular re-ed: Chin tuck with noodle 10 sec x 10  Scap squeeze with noodle 10 sec x 10   Therapeutic Activity: Shoulder isometrics at wall 5 sec x 10 each  Extension Flexion fist on towel  Abduction ER IR at doorway Modalities:    Vaso medium pressure; 34 deg; 10 min Self Care: Avoid popping activities by trying to keep shoulder blade down and back, providing stability for shoulder movement     PATIENT EDUCATION: Education details: POC; HEP  Person educated: Patient Education method: Explanation, Demonstration, Tactile cues, Verbal cues, and Handouts Education comprehension: verbalized understanding, returned demonstration, verbal cues required, tactile cues required, and needs further education  HOME EXERCISE PROGRAM: Access Code: [facility] URL: [email or web address] Date: 11/13/2023 Prepared by: Dorsey Charette  Exercises - Seated Shoulder Flexion AAROM with Pulley Behind  - 2 x daily - 7 x weekly - 1 sets - 10 reps - 10 sec  hold - Seated Shoulder Scaption AAROM with Pulley at Side  - 2 x daily - 7 x weekly - 1 sets - 10 reps - 10sec  hold - Seated Scapular Retraction  - 2 x daily - 7 x weekly - 1-2 sets - 10 reps - 10 sec  hold - Standing Anatomical Position with Scapular Retraction and Depression at Wall  - 2 x daily - 7 x weekly - 1 sets - 5-10 reps - 5-10 sec  hold - Seated Cervical Retraction  - 2 x daily - 7 x weekly - 1-2 sets - 5-10 reps - 10 sec  hold - Seated Cervical Sidebending AROM  - 2 x daily - 7 x weekly - 1 sets - 3-5 reps - 5-10 sec  hold - Standing Piriformis Release with Ball at [employer]  - 2 x daily - 7 x weekly - 30-60 sec  hold - Standing Infraspinatus/Teres Minor Release with Ball at [employer]  - 2 x daily - 7 x weekly - Standing Isometric Shoulder Extension with Doorway - Arm Bent  - 2 x daily - 7 x weekly - 1 sets - 5-10 reps - 5 sec  hold - Isometric Shoulder Flexion at Wall  - 2 x daily - 7 x weekly - 1 sets - 5-10 reps - 5 sec  hold - Isometric Shoulder Abduction at Wall  - 2 x daily - 7 x weekly - 1 sets - 5-10 reps - 5 sec  hold -  Isometric Shoulder External Rotation at Wall  - 1 x daily - 7 x weekly - 1 sets - 10 reps - 5 sec  hold - Standing Isometric Shoulder Internal Rotation with Towel Roll at Doorway  - 2 x daily - 7 x weekly - 1 sets - 5 reps - 5 sec  hold  ASSESSMENT:  CLINICAL IMPRESSION: Patient reports compliance with HEP. Continued with ROM for R shoulder. Progress with isometric strengthening for R UE in neutral.  Eval: Patient is a 73 y.o. male who was seen today for physical therapy evaluation and treatment s/p R shoulder scope with debridement, SAD 10/25/23. Patient had initial RCR 04/11/23 with failure of tendon repair. Patient does not recall re-injury of shoulder. Patient reports that Dr Dozier was unable to repair tendons and feels Darina will need a TSA in the future. Patient presents with limited ROM; decreased strength; poor posture and alignment; pain with functional activities; muscular tightness to palpation R UE. He will benefit from PT to address problems identified.   OBJECTIVE IMPAIRMENTS: decreased activity tolerance, decreased mobility, decreased ROM, decreased strength, increased edema, increased fascial restrictions, impaired UE functional use, improper body mechanics, postural dysfunction, and pain.     GOALS: Goals reviewed with patient? Yes  SHORT TERM GOALS: Target date: 11/28/2023   Independent in initial HEP  Baseline: Goal status: INITIAL  2.  Increase AROM R shoulder to without 10-15 degrees of AROM L shoulder  Baseline:  Goal status: INITIAL  3.  Decrease pain with raising arm by 50% allowing patient to use R UE for increased functional activities  Baseline:  Goal status: INITIAL   LONG TERM GOALS: Target date: 12/19/2023    Decrease R shoulder pain by 75% allowing patient to precede with normal functional activities  Baseline:  Goal status: INITIAL  2.  Patient demonstrates ability to reach behind back to fasten seatbelt in car  Baseline:  Goal status:  INITIAL  3.  Patient demonstrates ability to raise R arm to > 120 degrees to improve functional use of R UE  Baseline:  Goal status: INITIAL  4.  Patient reports ability to use R UE for grooming and hygiene  Baseline:  Goal status: INITIAL  5.  Improve PSFS: THE PATIENT SPECIFIC FUNCTIONAL SCALE score by 2 points   Place score of 0-10 (0 = unable to perform activity and 10 = able to perform activity at the same level as before injury or problem)  Activity Date: 11/07/23    Reaching behind body  0    2. Reaching above 100 degrees  2    3. Sleeping on R side  0    4. Combing hair  4    Total Score 6      Total Score = Sum of activity scores/number of activities 6/4 = 1.5  Baseline: 1.5  Goal status: INITIAL  6.  Independent in advanced HEP  Baseline:  Goal status: INITIAL  PLAN:  PT FREQUENCY: 2x/week  PT DURATION: 8 weeks  PLANNED INTERVENTIONS: 97164- PT Re-evaluation, 97110-Therapeutic exercises, 97530- Therapeutic activity, 97112- Neuromuscular re-education, 97535- Self Care, [id number]- Manual therapy, Patient/Family education, Taping, and Joint mobilization  PLAN FOR NEXT SESSION: review and progress with exercises; education and post op precautions; manual work and modalities as indicated    [employer], PT 11/13/2023, 10:57 AM

## 2023-11-15 NOTE — Therapy (Incomplete)
 OUTPATIENT PHYSICAL THERAPY SHOULDER TREATMENT   Patient Name: [name] MRN: [id number] DOB:January 24, 1951, 73 y.o., male Today's Date: 11/15/2023  END OF SESSION:    Past Medical History:  Diagnosis Date   Anemia    Arthritis    Depression 11/02/2019   Hypertension    Liver cirrhosis secondary to NASH [facility])    Pre-diabetes    Past Surgical History:  Procedure Laterality Date   CARPAL TUNNEL RELEASE Bilateral 03/28/2003   CHOLECYSTECTOMY     laparoscopic   COLON SURGERY  03/27/2001   diverticulitis   COLON SURGERY     JOINT REPLACEMENT Bilateral 03/27/2010   knees   NASAL SEPTOPLASTY W/ TURBINOPLASTY Bilateral 07/03/2022   Procedure: NASAL SEPTOPLASTY WITH TURBINATE REDUCTION;  Surgeon: Jesus Oliphant, MD;  Location: [facility];  Service: ENT;  Laterality: Bilateral;   POSTERIOR LUMBAR FUSION 2 WITH HARDWARE REMOVAL Right 10/25/2023   Procedure: ARTHROSCOPY, SHOULDER WITH DEBRIDEMENT;  Surgeon: Dozier Soulier, MD;  Location: [facility];  Service: Orthopedics;  Laterality: Right;   SHOULDER ARTHROSCOPY Left 06/25/2015   Procedure: ARTHROSCOPY SHOULDER;  Surgeon: Norleen Gavel, MD;  Location: [facility];  Service: Orthopedics;  Laterality: Left;  Left shoulder arthroscopy with subacromial decompression and distal clavicle resection.    SHOULDER ARTHROSCOPY WITH ROTATOR CUFF REPAIR Right 10/25/2023   Procedure: ARTHROSCOPY, SHOULDER, WITH ROTATOR CUFF REPAIR;  Surgeon: Dozier Soulier, MD;  Location: [facility];  Service: Orthopedics;  Laterality: Right;   SHOULDER OPEN ROTATOR CUFF REPAIR Left 06/25/2015   Procedure: ROTATOR CUFF REPAIR SHOULDER OPEN;  Surgeon: Norleen Gavel, MD;  Location: [facility];  Service: Orthopedics;  Laterality: Left;   SUBACROMIAL DECOMPRESSION  10/25/2023   Procedure: DECOMPRESSION, SUBACROMIAL SPACE;  Surgeon: Dozier Soulier, MD;  Location: [facility];  Service: Orthopedics;;   TOTAL KNEE REVISION Right 04/25/2021    Procedure: KNEE REVISION two componet quadriceps tendon repair;  Surgeon: Gavel Norleen, MD;  Location: [facility];  Service: Orthopedics;  Laterality: Right;   TOTAL KNEE REVISION Right 07/17/2022   Procedure: RIGHT TOTAL KNEE REVISION;  Surgeon: Liam Lerner, MD;  Location: [facility];  Service: Orthopedics;  Laterality: Right;   TRIGGER FINGER RELEASE Right    Patient Active Problem List   Diagnosis Date Noted   Lower extremity edema 07/28/2023   Chronic low back pain 07/27/2023   Shortness of breath 07/27/2023   Orthopnea 07/27/2023   Respiratory distress 07/27/2023   Liver cirrhosis (HCC) 07/27/2023   Morbid obesity (HCC) 07/18/2022   Prediabetes 07/18/2022   S/P revision of total knee, right 07/17/2022   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) 04/25/2021   Failed total knee, 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  Rationale for Evaluation and Treatment: Rehabilitation  ONSET DATE: 10/25/23  SUBJECTIVE:  SUBJECTIVE STATEMENT: 11/15/2023: ***  *** Feels like his shoulder is popping will move in a different way and shoulder will hurt. He then gets it to pop and it feels okay again. He is working on his exercises. Got a pulley for home.   Eval: Patient reports that he has had R shoulder pain since 9/24 when he noticed pain with raising his arm. He was diagnosed with torn rotator cuff and underwent surgery 04/11/23 with rotator cuff repair. Two of three tendons were  subsequently torn. He underwent arthroscopic surgery 10/25/23 to attempt to repair the tendons but were unsuccessful at tendon repair. Out of sling since 11/02/23. He has no precautions per MD per patient.  Hand dominance: Right  PERTINENT HISTORY: HTN; bilat TKA L 2012; TKA R 2012 x 2 with 3rd surgery with repeat R TKA 2021; L RCR ~ 3 yrs ago; Carpal tunnel surgery bilat ~ 20 yrs ago; obesity   PAIN:  Are you having pain? Yes: NPRS scale: 0/10 with movement; 2/10 at rest arm supported  Pain location: R shoulder  Pain description: sharp  Aggravating factors: moving R arm  Relieving factors: ice; rest   PRECAUTIONS: Shoulder - no precautions per pt report and per MD order - (failed RCR)    WEIGHT BEARING RESTRICTIONS: Yes UE NWB  FALLS:  Has patient fallen in last 6 months? No  LIVING ENVIRONMENT: Lives with: lives with their spouse Lives in: House/apartment Stairs: Yes: Internal: 11 steps; can reach both and External: 7 steps; on left going up Has following equipment at home: None  OCCUPATION: Retired from [employer]ist, research (life sciences) - retired ~ 5 yrs ago; Mudlogger; painting; household chores; travel; golf has not played in > 1 year; picker ball has not played in ~ 1 year   PLOF: Independent  PATIENT GOALS:get movement back in the arm and get it moving as close to normal as possible   NEXT MD VISIT: 12/03/23  OBJECTIVE:  Note: Objective measures were completed at Evaluation unless otherwise noted.  DIAGNOSTIC FINDINGS:  MRI 10/04/23: FINDINGS: Rotator cuff: Prior rotator cuff repair with postsurgical changes. Complete full-thickness, full width tear of the supraspinatus tendon with 2.4 cm of retraction. Severe infraspinatus tendinosis. Teres minor tendon is intact. Severe subscapularis tendinosis.   Muscles: No muscle atrophy or edema. No intramuscular fluid collection or hematoma.   Biceps Long Head: Prior tenodesis of the proximal long head of the biceps tendon  with adjacent moderate tendinosis.   Acromioclavicular Joint: Severe arthropathy of the acromioclavicular joint. Large amount of contrast in the subacromial/subdeltoid bursa communicating with the [facility] joint and glenohumeral joint.   Glenohumeral Joint: Contrast in the glenohumeral joint distending the joint capsule. Normal glenohumeral ligaments. No chondral defect.   Labrum: Intact   Bones: Prior superior labral debridement.   Other: No fluid collection or hematoma.   IMPRESSION: 1. Prior rotator cuff repair with postsurgical changes. Complete full-thickness, full width tear of the supraspinatus tendon with 2.4 cm of retraction. 2. Severe infraspinatus and subscapularis tendinosis. 3. Prior tenodesis of the proximal long head of the biceps tendon with adjacent moderate tendinosis.  PATIENT SURVEYS:  PSFS: THE PATIENT SPECIFIC FUNCTIONAL SCALE  Place score of 0-10 (0 = unable to perform activity and 10 = able to perform activity at the same level as before injury or problem)  Activity Date: 11/07/23    Reaching behind body  0    2. Reaching above 100 degrees  2    3. Sleeping on R side  0    4. Combing hair  4    Total Score 6      Total Score = Sum of activity scores/number of activities 6/4 = 1.5   Minimally Detectable Change: 3 points (for single activity); 2 points (for average score)  [facility] (nd). The Patient Specific Functional Scale . Retrieved from [email or web address]    POSTURE: Patient presents with head forward posture with increased thoracic kyphosis; shoulders rounded and elevated; scapulae abducted and rotated along the thoracic spine; head of the humerus anterior in orientation.   UPPER EXTREMITY ROM: pain with all AROM R shoulder   Active ROM Right eval Left eval  Shoulder flexion 123 144  Shoulder extension 54 73  Shoulder abduction 87 143  Shoulder adduction    Shoulder  internal rotation waist T7  Shoulder external rotation 32 52  Elbow flexion    Elbow extension    Wrist flexion    Wrist extension    Wrist ulnar deviation    Wrist radial deviation    Wrist pronation    Wrist supination    (Blank rows = not tested)  UPPER EXTREMITY MMT: Not assessed resistively; poor postural strength noted in posterior shoulder girdle   MMT Right eval Left eval  Shoulder flexion    Shoulder extension    Shoulder abduction    Shoulder adduction    Shoulder internal rotation    Shoulder external rotation    Middle trapezius    Lower trapezius    Elbow flexion    Elbow extension    Wrist flexion    Wrist extension    Wrist ulnar deviation    Wrist radial deviation    Wrist pronation    Wrist supination    Grip strength (lbs)    (Blank rows = not tested)  PALPATION:  Muscular tightness through R shoulder girdle through the pecs; upper traps; leveator; biceps; deltoid; teres Tight through thoracic spine                                                                                                                              [facility] Adult PT Treatment:                                                DATE: 11/16/23 Therapeutic Exercise: *** Manual Therapy: *** Neuromuscular re-ed: *** Therapeutic Activity: *** Modalities: *** Self Care: ***    [facility] Adult PT Treatment:                                                DATE: 11/13/23 Therapeutic Exercise: Pulley flexion 10 sec x 10  Pulley scaption 10 sec x  10  Pulley horizontal abduction in ~ 70 deg abduction x 10  Manual Therapy: STM; PROM/AAROM R shoulder patient sitting - flexion; scaption; ER within tissue limits no pain  Joint mobilization using towel  Neuromuscular re-ed: Chin tuck with noodle 10 sec x 10  Scap squeeze with noodle 10 sec x 10  Therapeutic Activity: Shoulder isometrics at wall 5 sec x 10 each  Extension Flexion fist on towel  Abduction ER IR at doorway Modalities:     Vaso medium pressure; 34 deg; 10 min Self Care: Avoid popping activities by trying to keep shoulder blade down and back, providing stability for shoulder movement     PATIENT EDUCATION: Education details: POC; HEP  Person educated: Patient Education method: Explanation, Demonstration, Tactile cues, Verbal cues, and Handouts Education comprehension: verbalized understanding, returned demonstration, verbal cues required, tactile cues required, and needs further education  HOME EXERCISE PROGRAM: Access Code: [facility] URL: [email or web address] Date: 11/13/2023 Prepared by: Celyn Holt  Exercises - Seated Shoulder Flexion AAROM with Pulley Behind  - 2 x daily - 7 x weekly - 1 sets - 10 reps - 10 sec  hold - Seated Shoulder Scaption AAROM with Pulley at Side  - 2 x daily - 7 x weekly - 1 sets - 10 reps - 10sec  hold - Seated Scapular Retraction  - 2 x daily - 7 x weekly - 1-2 sets - 10 reps - 10 sec  hold - Standing Anatomical Position with Scapular Retraction and Depression at Wall  - 2 x daily - 7 x weekly - 1 sets - 5-10 reps - 5-10 sec  hold - Seated Cervical Retraction  - 2 x daily - 7 x weekly - 1-2 sets - 5-10 reps - 10 sec  hold - Seated Cervical Sidebending AROM  - 2 x daily - 7 x weekly - 1 sets - 3-5 reps - 5-10 sec  hold - Standing Piriformis Release with Ball at [employer]  - 2 x daily - 7 x weekly - 30-60 sec  hold - Standing Infraspinatus/Teres Minor Release with Ball at [employer]  - 2 x daily - 7 x weekly - Standing Isometric Shoulder Extension with Doorway - Arm Bent  - 2 x daily - 7 x weekly - 1 sets - 5-10 reps - 5 sec  hold - Isometric Shoulder Flexion at Wall  - 2 x daily - 7 x weekly - 1 sets - 5-10 reps - 5 sec  hold - Isometric Shoulder Abduction at Wall  - 2 x daily - 7 x weekly - 1 sets - 5-10 reps - 5 sec  hold - Isometric Shoulder External Rotation at Wall  - 1 x daily - 7 x weekly - 1 sets - 10 reps - 5 sec  hold - Standing Isometric Shoulder Internal  Rotation with Towel Roll at Doorway  - 2 x daily - 7 x weekly - 1 sets - 5 reps - 5 sec  hold  ASSESSMENT:  CLINICAL IMPRESSION: 11/15/2023: *** *** Patient reports compliance with HEP. Continued with ROM for R shoulder. Progress with isometric strengthening for R UE in neutral.    Eval: Patient is a 73 y.o. male who was seen today for physical therapy evaluation and treatment s/p R shoulder scope with debridement, SAD 10/25/23. Patient had initial RCR 04/11/23 with failure of tendon repair. Patient does not recall re-injury of shoulder. Patient reports that Dr Dozier was unable to repair tendons and feels Darina will need a TSA in the  future. Patient presents with limited ROM; decreased strength; poor posture and alignment; pain with functional activities; muscular tightness to palpation R UE. He will benefit from PT to address problems identified.   OBJECTIVE IMPAIRMENTS: decreased activity tolerance, decreased mobility, decreased ROM, decreased strength, increased edema, increased fascial restrictions, impaired UE functional use, improper body mechanics, postural dysfunction, and pain.     GOALS: Goals reviewed with patient? Yes  SHORT TERM GOALS: Target date: 11/28/2023   Independent in initial HEP  Baseline: Goal status: INITIAL  2.  Increase AROM R shoulder to without 10-15 degrees of AROM L shoulder  Baseline:  Goal status: INITIAL  3.  Decrease pain with raising arm by 50% allowing patient to use R UE for increased functional activities  Baseline:  Goal status: INITIAL   LONG TERM GOALS: Target date: 12/19/2023    Decrease R shoulder pain by 75% allowing patient to precede with normal functional activities  Baseline:  Goal status: INITIAL  2.  Patient demonstrates ability to reach behind back to fasten seatbelt in car  Baseline:  Goal status: INITIAL  3.  Patient demonstrates ability to raise R arm to > 120 degrees to improve functional use of R UE  Baseline:  Goal  status: INITIAL  4.  Patient reports ability to use R UE for grooming and hygiene  Baseline:  Goal status: INITIAL  5.  Improve PSFS: THE PATIENT SPECIFIC FUNCTIONAL SCALE score by 2 points   Place score of 0-10 (0 = unable to perform activity and 10 = able to perform activity at the same level as before injury or problem)  Activity Date: 11/07/23    Reaching behind body  0    2. Reaching above 100 degrees  2    3. Sleeping on R side  0    4. Combing hair  4    Total Score 6      Total Score = Sum of activity scores/number of activities 6/4 = 1.5  Baseline: 1.5  Goal status: INITIAL  6.  Independent in advanced HEP  Baseline:  Goal status: INITIAL  PLAN:  PT FREQUENCY: 2x/week  PT DURATION: 8 weeks  PLANNED INTERVENTIONS: 97164- PT Re-evaluation, 97110-Therapeutic exercises, 97530- Therapeutic activity, 97112- Neuromuscular re-education, 97535- Self Care, [id number]- Manual therapy, Patient/Family education, Taping, and Joint mobilization  PLAN FOR NEXT SESSION: review and progress with exercises; education and post op precautions; manual work and modalities as indicated    Alm DELENA Jenny PT, DPT 11/15/2023 1:01 PM

## 2023-11-16 ENCOUNTER — Encounter: Admitting: Physical Therapy

## 2023-11-20 ENCOUNTER — Encounter: Admitting: Rehabilitative and Restorative Service Providers"

## 2023-11-22 ENCOUNTER — Encounter: Admitting: Rehabilitative and Restorative Service Providers"

## 2023-11-27 ENCOUNTER — Encounter: Admitting: Rehabilitative and Restorative Service Providers"

## 2023-11-29 ENCOUNTER — Ambulatory Visit: Admitting: Rehabilitative and Restorative Service Providers"

## 2023-11-30 ENCOUNTER — Encounter: Admitting: Physical Therapy

## 2023-12-03 DIAGNOSIS — M75121 Complete rotator cuff tear or rupture of right shoulder, not specified as traumatic: Secondary | ICD-10-CM | POA: Diagnosis not present

## 2024-02-06 ENCOUNTER — Telehealth: Payer: Self-pay

## 2024-02-06 NOTE — Telephone Encounter (Signed)
 [facility] is listed as PCP with [facility]. LMOM to have pt call to establish care if needed. Looks like he may go to the [place].

## 2024-02-06 NOTE — Telephone Encounter (Signed)
 Communication  Pt received a call about establishing care. Appt was made for pt with Dr. Sherlynn.   Noted.

## 2024-02-13 DIAGNOSIS — J309 Allergic rhinitis, unspecified: Secondary | ICD-10-CM | POA: Insufficient documentation

## 2024-02-13 DIAGNOSIS — I129 Hypertensive chronic kidney disease with stage 1 through stage 4 chronic kidney disease, or unspecified chronic kidney disease: Secondary | ICD-10-CM | POA: Insufficient documentation

## 2024-02-13 DIAGNOSIS — D518 Other vitamin B12 deficiency anemias: Secondary | ICD-10-CM | POA: Insufficient documentation

## 2024-02-13 DIAGNOSIS — H903 Sensorineural hearing loss, bilateral: Secondary | ICD-10-CM | POA: Insufficient documentation

## 2024-02-13 DIAGNOSIS — H43812 Vitreous degeneration, left eye: Secondary | ICD-10-CM | POA: Insufficient documentation

## 2024-02-13 DIAGNOSIS — F341 Dysthymic disorder: Secondary | ICD-10-CM | POA: Insufficient documentation

## 2024-02-13 DIAGNOSIS — M50922 Unspecified cervical disc disorder at C5-C6 level: Secondary | ICD-10-CM | POA: Insufficient documentation

## 2024-02-13 DIAGNOSIS — F331 Major depressive disorder, recurrent, moderate: Secondary | ICD-10-CM | POA: Insufficient documentation

## 2024-02-14 ENCOUNTER — Encounter: Payer: Self-pay | Admitting: Sports Medicine

## 2024-02-14 ENCOUNTER — Ambulatory Visit: Admitting: Sports Medicine

## 2024-02-14 VITALS — BP 180/90 | HR 68 | Temp 97.9°F | Ht 66.0 in | Wt 270.0 lb

## 2024-02-14 DIAGNOSIS — R600 Localized edema: Secondary | ICD-10-CM

## 2024-02-14 DIAGNOSIS — I10 Essential (primary) hypertension: Secondary | ICD-10-CM

## 2024-02-14 DIAGNOSIS — N401 Enlarged prostate with lower urinary tract symptoms: Secondary | ICD-10-CM

## 2024-02-14 DIAGNOSIS — K7469 Other cirrhosis of liver: Secondary | ICD-10-CM

## 2024-02-14 DIAGNOSIS — R350 Frequency of micturition: Secondary | ICD-10-CM

## 2024-02-14 DIAGNOSIS — F5101 Primary insomnia: Secondary | ICD-10-CM

## 2024-02-14 MED ORDER — LOSARTAN POTASSIUM 50 MG PO TABS: 100.0000 mg | ORAL_TABLET | Freq: Every day | ORAL | Status: DC

## 2024-02-14 MED ORDER — TAMSULOSIN HCL 0.4 MG PO CAPS: 0.4000 mg | ORAL_CAPSULE | Freq: Every day | ORAL | 3 refills | Status: DC

## 2024-02-14 MED ORDER — METOPROLOL SUCCINATE ER 25 MG PO TB24: 25.0000 mg | ORAL_TABLET | Freq: Every day | ORAL | 0 refills | Status: DC

## 2024-02-14 NOTE — Assessment & Plan Note (Signed)
  Orders:   Ambulatory referral to Urology   tamsulosin (FLOMAX) 0.4 MG CAPS capsule; Take 1 capsule (0.4 mg total) by mouth daily.

## 2024-02-14 NOTE — Therapy (Signed)
 OUTPATIENT PHYSICAL THERAPY SHOULDER EVALUATION PHYSICAL THERAPY DISCHARGE SUMMARY  Visits from Start of Care: Evaluation only   Current functional level related to goals / functional outcomes: Unknown    Remaining deficits: Unknown    Education / Equipment: Initial HEP    Patient agrees to discharge. Patient goals were not met. Patient is being discharged due to not returning since the last visit.   Patient Name: [name] MRN: [id number] DOB:April 07, 1950, 73 y.o., male Today's Date: 02/14/2024  END OF SESSION:    Past Medical History:  Diagnosis Date   Anemia    Arthritis    Depression 11/02/2019   Hypertension    Liver cirrhosis secondary to NASH [facility])    Pre-diabetes    Past Surgical History:  Procedure Laterality Date   CARPAL TUNNEL RELEASE Bilateral 03/28/2003   CHOLECYSTECTOMY     laparoscopic   COLON SURGERY  03/27/2001   diverticulitis   COLON SURGERY     JOINT REPLACEMENT Bilateral 03/27/2010   knees   NASAL SEPTOPLASTY W/ TURBINOPLASTY Bilateral 07/03/2022   Procedure: NASAL SEPTOPLASTY WITH TURBINATE REDUCTION;  Surgeon: Jesus Oliphant, MD;  Location: [facility];  Service: ENT;  Laterality: Bilateral;   POSTERIOR LUMBAR FUSION 2 WITH HARDWARE REMOVAL Right 10/25/2023   Procedure: ARTHROSCOPY, SHOULDER WITH DEBRIDEMENT;  Surgeon: Dozier Soulier, MD;  Location: [facility];  Service: Orthopedics;  Laterality: Right;   SHOULDER ARTHROSCOPY Left 06/25/2015   Procedure: ARTHROSCOPY SHOULDER;  Surgeon: Norleen Gavel, MD;  Location: [facility];  Service: Orthopedics;  Laterality: Left;  Left shoulder arthroscopy with subacromial decompression and distal clavicle resection.    SHOULDER ARTHROSCOPY WITH ROTATOR CUFF REPAIR Right 10/25/2023   Procedure: ARTHROSCOPY, SHOULDER, WITH ROTATOR CUFF REPAIR;  Surgeon: Dozier Soulier, MD;  Location: [facility];  Service: Orthopedics;  Laterality: Right;   SHOULDER OPEN ROTATOR CUFF REPAIR Left  06/25/2015   Procedure: ROTATOR CUFF REPAIR SHOULDER OPEN;  Surgeon: Norleen Gavel, MD;  Location: [facility];  Service: Orthopedics;  Laterality: Left;   SUBACROMIAL DECOMPRESSION  10/25/2023   Procedure: DECOMPRESSION, SUBACROMIAL SPACE;  Surgeon: Dozier Soulier, MD;  Location: [facility];  Service: Orthopedics;;   TOTAL KNEE REVISION Right 04/25/2021   Procedure: KNEE REVISION two componet quadriceps tendon repair;  Surgeon: Gavel Norleen, MD;  Location: [facility];  Service: Orthopedics;  Laterality: Right;   TOTAL KNEE REVISION Right 07/17/2022   Procedure: RIGHT TOTAL KNEE REVISION;  Surgeon: Liam Lerner, MD;  Location: [facility];  Service: Orthopedics;  Laterality: Right;   TRIGGER FINGER RELEASE Right    Patient Active Problem List   Diagnosis Date Noted   Allergic rhinitis 02/13/2024   Dietary vitamin B12 deficiency anemia 02/13/2024   Hypertensive chronic kidney disease with stage 1 through stage 4 chronic kidney disease, or unspecified chronic kidney disease 02/13/2024   Primary dysthymia late onset 02/13/2024   Moderate recurrent major depression (HCC) 02/13/2024   Sensorineural hearing loss, bilateral 02/13/2024   Vitreous degeneration, left eye 02/13/2024   Unspecified cervical disc disorder at C5-C6 level 02/13/2024   Lower extremity edema 07/28/2023   Chronic low back pain 07/27/2023   Shortness of breath 07/27/2023   Orthopnea 07/27/2023   Respiratory distress 07/27/2023   Liver cirrhosis (HCC) 07/27/2023   Morbid obesity (HCC) 07/18/2022   Prediabetes 07/18/2022   S/P revision of total knee, right 07/17/2022   Nasal septal deviation 03/13/2022   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 04/25/2021  Failed total knee, 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   Migraine 09/10/2020   Sleep apnea 09/10/2020   Benign prostatic hyperplasia 09/10/2020   Cobalamin deficiency 09/10/2020   Hypertension 11/02/2019   Depression  11/02/2019   Thrombocytopenia 11/02/2019   Acute respiratory failure with hypoxia (HCC) 11/02/2019   Elevated troponin 11/02/2019   Hypokalemia 11/02/2019   Closed fracture of distal phalanx of finger of right hand with mallet deformity 10/05/2017    PCP: Dr Wilburn Cannon  REFERRING PROVIDER: Dr Eva Herring   REFERRING DIAG: R shoulder scope, debridement, SAD   THERAPY DIAG:  Acute pain of right shoulder  Other symptoms and signs involving the musculoskeletal system  Muscle weakness (generalized)  Abnormal posture  Rationale for Evaluation and Treatment: Rehabilitation  ONSET DATE: 10/25/23  SUBJECTIVE:                                                                                                                                                                                      SUBJECTIVE STATEMENT: Patient reports that he has had R shoulder pain since 9/24 when he noticed pain with raising his arm. He was diagnosed with torn rotator cuff and underwent surgery 04/11/23 with rotator cuff repair. Two of three tendons were subsequently torn. He underwent arthroscopic surgery 10/25/23 to attempt to repair the tendons but were unsuccessful at tendon repair. Out of sling since 11/02/23. He has no precautions per MD per patient.  Hand dominance: Right  PERTINENT HISTORY: HTN; bilat TKA L 2012; TKA R 2012 x 2 with 3rd surgery with repeat R TKA 2021; L RCR ~ 3 yrs ago; Carpal tunnel surgery bilat ~ 20 yrs ago; obesity   PAIN:  Are you having pain? Yes: NPRS scale: 7-8/10 with movement; 2/10 at rest arm supported  Pain location: R shoulder  Pain description: sharp  Aggravating factors: moving R arm  Relieving factors: ice; rest   PRECAUTIONS: Shoulder  RED FLAGS: None   WEIGHT BEARING RESTRICTIONS: Yes UE NWB  FALLS:  Has patient fallen in last 6 months? No  LIVING ENVIRONMENT: Lives with: lives with their spouse Lives in: House/apartment Stairs: Yes: Internal: 11  steps; can reach both and External: 7 steps; on left going up Has following equipment at home: None  OCCUPATION: Retired from scientist, research (life sciences) - retired ~ 5 yrs ago; mudlogger; painting; household chores; travel; golf has not played in > 1 year; picker ball has not played in ~ 1 year   PLOF: Independent  PATIENT GOALS:get movement back in the arm and get it moving as close to normal as possible   NEXT MD VISIT: 12/03/23  OBJECTIVE:  Note: Objective measures were completed  at Evaluation unless otherwise noted.  DIAGNOSTIC FINDINGS:  MRI 10/04/23: FINDINGS: Rotator cuff: Prior rotator cuff repair with postsurgical changes. Complete full-thickness, full width tear of the supraspinatus tendon with 2.4 cm of retraction. Severe infraspinatus tendinosis. Teres minor tendon is intact. Severe subscapularis tendinosis.   Muscles: No muscle atrophy or edema. No intramuscular fluid collection or hematoma.   Biceps Long Head: Prior tenodesis of the proximal long head of the biceps tendon with adjacent moderate tendinosis.   Acromioclavicular Joint: Severe arthropathy of the acromioclavicular joint. Large amount of contrast in the subacromial/subdeltoid bursa communicating with the [facility] joint and glenohumeral joint.   Glenohumeral Joint: Contrast in the glenohumeral joint distending the joint capsule. Normal glenohumeral ligaments. No chondral defect.   Labrum: Intact   Bones: Prior superior labral debridement.   Other: No fluid collection or hematoma.   IMPRESSION: 1. Prior rotator cuff repair with postsurgical changes. Complete full-thickness, full width tear of the supraspinatus tendon with 2.4 cm of retraction. 2. Severe infraspinatus and subscapularis tendinosis. 3. Prior tenodesis of the proximal long head of the biceps tendon with adjacent moderate tendinosis.  PATIENT SURVEYS:  PSFS: THE PATIENT SPECIFIC FUNCTIONAL SCALE  Place score of 0-10 (0 = unable to  perform activity and 10 = able to perform activity at the same level as before injury or problem)  Activity Date: 11/07/23    Reaching behind body  0    2. Reaching above 100 degrees  2    3. Sleeping on R side  0    4. Combing hair  4    Total Score 6      Total Score = Sum of activity scores/number of activities 6/4 = 1.5   Minimally Detectable Change: 3 points (for single activity); 2 points (for average score)  [facility] (nd). The Patient Specific Functional Scale . Retrieved from [email or web address]   COGNITION: Overall cognitive status: Within functional limits for tasks assessed     SENSATION: WFL  POSTURE: Patient presents with head forward posture with increased thoracic kyphosis; shoulders rounded and elevated; scapulae abducted and rotated along the thoracic spine; head of the humerus anterior in orientation.   UPPER EXTREMITY ROM: pain with all AROM R shoulder   Active ROM Right eval Left eval  Shoulder flexion 123 144  Shoulder extension 54 73  Shoulder abduction 87 143  Shoulder adduction    Shoulder internal rotation waist T7  Shoulder external rotation 32 52  Elbow flexion    Elbow extension    Wrist flexion    Wrist extension    Wrist ulnar deviation    Wrist radial deviation    Wrist pronation    Wrist supination    (Blank rows = not tested)  UPPER EXTREMITY MMT: Not assessed resistively; poor postural strength noted in posterior shoulder girdle   MMT Right eval Left eval  Shoulder flexion    Shoulder extension    Shoulder abduction    Shoulder adduction    Shoulder internal rotation    Shoulder external rotation    Middle trapezius    Lower trapezius    Elbow flexion    Elbow extension    Wrist flexion    Wrist extension    Wrist ulnar deviation    Wrist radial deviation    Wrist pronation    Wrist supination    Grip strength (lbs)    (Blank rows = not  tested)  PALPATION:  Muscular tightness through R shoulder girdle through the  pecs; upper traps; leveator; biceps; deltoid; teres Tight through thoracic spine                                                                                                                              TREATMENT DATE: 11/07/23 eval; HEP    PATIENT EDUCATION: Education details: POC; HEP  Person educated: Patient Education method: Programmer, Multimedia, Demonstration, Actor cues, Verbal cues, and Handouts Education comprehension: verbalized understanding, returned demonstration, verbal cues required, tactile cues required, and needs further education  HOME EXERCISE PROGRAM: Access Code: [facility] URL: [email or web address] Date: 11/07/2023 Prepared by: Judah Chevere  Exercises - Seated Shoulder Flexion AAROM with Pulley Behind  - 2 x daily - 7 x weekly - 1 sets - 10 reps - 10 sec  hold - Seated Shoulder Scaption AAROM with Pulley at Side  - 2 x daily - 7 x weekly - 1 sets - 10 reps - 10sec  hold - Seated Scapular Retraction  - 2 x daily - 7 x weekly - 1-2 sets - 10 reps - 10 sec  hold - Standing Anatomical Position with Scapular Retraction and Depression at Wall  - 2 x daily - 7 x weekly - 1 sets - 5-10 reps - 5-10 sec  hold - Seated Cervical Retraction  - 2 x daily - 7 x weekly - 1-2 sets - 5-10 reps - 10 sec  hold - Seated Cervical Sidebending AROM  - 2 x daily - 7 x weekly - 1 sets - 3-5 reps - 5-10 sec  hold - Standing Piriformis Release with Ball at Wall  - 2 x daily - 7 x weekly - 30-60 sec  hold - Standing Infraspinatus/Teres Minor Release with Ball at [employer]  - 2 x daily - 7 x weekly  ASSESSMENT:  CLINICAL IMPRESSION: Patient is a 73 y.o. male who was seen today for physical therapy evaluation and treatment s/p R shoulder scope with debridement, SAD 10/25/23. Patient had initial RCR 04/11/23 with failure of tendon repair. Patient does not recall re-injury of shoulder. Patient reports that Dr  Dozier was unable to repair tendons and feels Darina will need a TSA in the future. Patient presents with limited ROM; decreased strength; poor posture and alignment; pain with functional activities; muscular tightness to palpation R UE. He will benefit from PT to address problems identified.   OBJECTIVE IMPAIRMENTS: decreased activity tolerance, decreased mobility, decreased ROM, decreased strength, increased edema, increased fascial restrictions, impaired UE functional use, improper body mechanics, postural dysfunction, and pain.   ACTIVITY LIMITATIONS: carrying, lifting, bending, sitting, sleeping, toileting, dressing, reach over head, and hygiene/grooming  PARTICIPATION LIMITATIONS: meal prep, cleaning, laundry, driving, shopping, community activity, and yard work  PERSONAL FACTORS: Past/current experiences and Time since onset of injury/illness/exacerbation are also affecting patient's functional outcome.   REHAB POTENTIAL: Good  CLINICAL DECISION MAKING: Evolving/moderate complexity  EVALUATION COMPLEXITY: Moderate   GOALS: Goals reviewed with patient? Yes  SHORT TERM GOALS: Target date: 11/28/2023  Independent in initial HEP  Baseline: Goal status: INITIAL  2.  Increase AROM R shoulder to without 10-15 degrees of AROM L shoulder  Baseline:  Goal status: INITIAL  3.  Decrease pain with raising arm by 50% allowing patient to use R UE for increased functional activities  Baseline:  Goal status: INITIAL   LONG TERM GOALS: Target date: 12/19/2023    Decrease R shoulder pain by 75% allowing patient to precede with normal functional activities  Baseline:  Goal status: INITIAL  2.  Patient demonstrates ability to reach behind back to fasten seatbelt in car  Baseline:  Goal status: INITIAL  3.  Patient demonstrates ability to raise R arm to > 120 degrees to improve functional use of R UE  Baseline:  Goal status: INITIAL  4.  Patient reports ability to use R UE for  grooming and hygiene  Baseline:  Goal status: INITIAL  5.  Improve PSFS: THE PATIENT SPECIFIC FUNCTIONAL SCALE score by 2 points   Place score of 0-10 (0 = unable to perform activity and 10 = able to perform activity at the same level as before injury or problem)  Activity Date: 11/07/23    Reaching behind body  0    2. Reaching above 100 degrees  2    3. Sleeping on R side  0    4. Combing hair  4    Total Score 6      Total Score = Sum of activity scores/number of activities 6/4 = 1.5  Baseline: 1.5  Goal status: INITIAL  6.  Independent in advanced HEP  Baseline:  Goal status: INITIAL  PLAN:  PT FREQUENCY: 2x/week  PT DURATION: 8 weeks  PLANNED INTERVENTIONS: 97164- PT Re-evaluation, 97110-Therapeutic exercises, 97530- Therapeutic activity, 97112- Neuromuscular re-education, 97535- Self Care, [id number]- Manual therapy, Patient/Family education, Taping, and Joint mobilization  PLAN FOR NEXT SESSION: review and progress with exercises; education and post op precautions; manual work and modalities as indicated    [employer], PT 02/14/2024, 8:10 AM

## 2024-02-14 NOTE — Assessment & Plan Note (Addendum)
 Denies abdominal pain, nausea, vomiting Will refer to GI Orders:   Ambulatory referral to Gastroenterology

## 2024-02-14 NOTE — Progress Notes (Signed)
 New Patient Office Visit  Patient ID: [name], Male   DOB: 03-07-1951 73 y.o. MRN: [id number] Subjective:     Discussed the use of AI scribe software for clinical note transcription with the patient, who gave verbal consent to proceed.  History of Present Illness  [name] is a 73 year old male with non-alcoholic cirrhosis and hypertension who presents to establish care   He has a history of non-alcoholic cirrhosis of the liver, diagnosed during a hospitalization .   Pt denies abdominal pain, nausea, vomiting. He hasn't followed with GI since discharge.  He is currently taking losartan  100 mg for hypertension. His blood pressure has been running at 185/80 mmHg. He has a history of being on multiple medications but stopped them after a while. He checks his blood pressure daily. He experiences swelling in his legs and uses compression stockings, although he dislikes wearing them. He experiences headaches almost daily, which start at the back of the neck and radiate forward, and he takes [facility] powders for relief, sometimes up to three or four a day.    He reports frequent urination during the day, sometimes up to three times an hour, but does not experience nocturia. No burning sensation or blood in the urine.   No waking up at night gasping for air. He quit smoking in 1983 and denies any history of asthma.  He has a history of bilateral knee replacements and a torn rotator cuff, for which he received a cortisone injection that allowed him to play golf recently. No recent falls or balance issues and feels physically younger than his age. He is interested in losing weight and improving his physical activity,     Outpatient Encounter Medications as of 02/14/2024  Medication Sig   aspirin  EC 81 MG tablet Take 1 tablet (81 mg total) by mouth 2 (two) times daily.   Melatonin 10 MG CHEW Chew by mouth.   metoprolol  succinate (TOPROL -XL) 25 MG 24 hr tablet Take 1 tablet (25 mg  total) by mouth daily.   tamsulosin  (FLOMAX ) 0.4 MG CAPS capsule Take 1 capsule (0.4 mg total) by mouth daily.   tiZANidine  (ZANAFLEX ) 4 MG tablet Take 1 tablet (4 mg total) by mouth every 8 (eight) hours as needed for muscle spasms.   traZODone  (DESYREL ) 100 MG tablet Take 100 mg by mouth at bedtime.   [DISCONTINUED] losartan  (COZAAR ) 50 MG tablet Take 1 tablet (50 mg total) by mouth daily.   [DISCONTINUED] oxyCODONE  (ROXICODONE ) 5 MG immediate release tablet Take 1 tablet (5 mg total) by mouth every 4 (four) hours as needed.   losartan  (COZAAR ) 50 MG tablet Take 2 tablets (100 mg total) by mouth daily.   [DISCONTINUED] methocarbamol  (ROBAXIN ) 500 MG tablet Take 500 mg by mouth 4 (four) times daily. (Patient not taking: Reported on 02/14/2024)   No facility-administered encounter medications on file as of 02/14/2024.    Past Medical History:  Diagnosis Date   Anemia    Arthritis    Depression 11/02/2019   Hypertension    Liver cirrhosis secondary to NASH [facility])    Pre-diabetes     Past Surgical History:  Procedure Laterality Date   CARPAL TUNNEL RELEASE Bilateral 03/28/2003   CHOLECYSTECTOMY     laparoscopic   COLON SURGERY  03/27/2001   diverticulitis   COLON SURGERY     JOINT REPLACEMENT Bilateral 03/27/2010   knees   NASAL SEPTOPLASTY W/ TURBINOPLASTY Bilateral 07/03/2022   Procedure: NASAL SEPTOPLASTY WITH TURBINATE REDUCTION;  Surgeon: Jesus Oliphant, MD;  Location: [facility];  Service: ENT;  Laterality: Bilateral;   POSTERIOR LUMBAR FUSION 2 WITH HARDWARE REMOVAL Right 10/25/2023   Procedure: ARTHROSCOPY, SHOULDER WITH DEBRIDEMENT;  Surgeon: Dozier Soulier, MD;  Location[facility];  Service: Orthopedics;  Laterality: Right;   SHOULDER ARTHROSCOPY Left 06/25/2015   Procedure: ARTHROSCOPY SHOULDER;  Surgeon: Norleen Gavel, MD;  Location: [facility];  Service: Orthopedics;  Laterality: Left;  Left shoulder arthroscopy with subacromial decompression  and distal clavicle resection.    SHOULDER ARTHROSCOPY WITH ROTATOR CUFF REPAIR Right 10/25/2023   Procedure: ARTHROSCOPY, SHOULDER, WITH ROTATOR CUFF REPAIR;  Surgeon: Dozier Soulier, MD;  [facility];  Service: Orthopedics;  Laterality: Right;   SHOULDER OPEN ROTATOR CUFF REPAIR Left 06/25/2015   Procedure: ROTATOR CUFF REPAIR SHOULDER OPEN;  Surgeon: Norleen Gavel, MD;  Location: [facility];  Service: Orthopedics;  Laterality: Left;   SUBACROMIAL DECOMPRESSION  10/25/2023   Procedure: DECOMPRESSION, SUBACROMIAL SPACE;  Surgeon: Dozier Soulier, MD;  [facility];  Service: Orthopedics;;   TOTAL KNEE REVISION Right 04/25/2021   Procedure: KNEE REVISION two componet quadriceps tendon repair;  Surgeon: Gavel Norleen, MD;  Location[facility];  Service: Orthopedics;  Laterality: Right;   TOTAL KNEE REVISION Right 07/17/2022   Procedure: RIGHT TOTAL KNEE REVISION;  Surgeon: Liam Lerner, MD;  Location[facility];  Service: Orthopedics;  Laterality: Right;   TRIGGER FINGER RELEASE Right     History reviewed. No pertinent family history.  Social History   Socioeconomic History   Marital status: Married    Spouse name: Not on file   Number of children: Not on file   Years of education: Not on file   Highest education level: Not on file  Occupational History   Not on file  Tobacco Use   Smoking status: Never   Smokeless tobacco: Never  Vaping Use   Vaping status: Never Used  Substance and Sexual Activity   Alcohol use: Yes    Comment: occas.   Drug use: No   Sexual activity: Not Currently  Other Topics Concern   Not on file  Social History Narrative   Not on file   Social Drivers of Health   Financial Resource Strain: Low Risk  (08/05/2023)   Received from [facility]   Overall Financial Resource Strain (CARDIA)    Difficulty of Paying Living Expenses: Not very hard  Food Insecurity: Food Insecurity Present (08/05/2023)   Received from [facility]   Hunger  Vital Sign    Within the past 12 months, you worried that your food would run out before you got the money to buy more.: Often true    Within the past 12 months, the food you bought just didn't last and you didn't have money to get more.: Often true  Transportation Needs: No Transportation Needs (08/05/2023)   Received from [facility] - Transportation    Lack of Transportation (Medical): No    Lack of Transportation (Non-Medical): No  Physical Activity: Unknown (08/05/2023)   Received from [facility]   Exercise Vital Sign    On average, how many days per week do you engage in moderate to strenuous exercise (like a brisk walk)?: 0 days    Minutes of Exercise per Session: Not on file  Stress: Stress Concern Present (08/05/2023)   Received from [facility] - Occupational Stress Questionnaire    Feeling of Stress :  To some extent  Social Connections: Socially Integrated (08/05/2023)   Received from [facility]   Social Network    How would you rate your social network (family, work, friends)?: Good participation with social networks  Intimate Partner Violence: Not At Risk (08/05/2023)   Received from [facility]   HITS    Over the last 12 months how often did your partner physically hurt you?: Never    Over the last 12 months how often did your partner insult you or talk down to you?: Never    Over the last 12 months how often did your partner threaten you with physical harm?: Never    Over the last 12 months how often did your partner scream or curse at you?: Never    Review of Systems  Constitutional:  Negative for chills and fever.  HENT:  Negative for congestion and sore throat.   Respiratory:  Positive for shortness of breath (CHRONIC). Negative for cough and sputum production.   Cardiovascular:  Negative for chest pain, palpitations and leg swelling.  Gastrointestinal:  Negative for abdominal pain, heartburn and nausea.   Genitourinary:  Positive for urgency. Negative for dysuria, frequency and hematuria.  Musculoskeletal:  Negative for falls.  Neurological:  Negative for dizziness, sensory change and focal weakness.  Psychiatric/Behavioral:  Negative for depression.      Objective:    BP (!) 189/104   Pulse 68   Temp 97.9 F (36.6 C) (Oral)   Ht 5' 6 (1.676 m)   Wt 270 lb (122.5 kg)   SpO2 96%   BMI 43.58 kg/m   Physical Exam Constitutional:      Appearance: Normal appearance.  HENT:     Head: Normocephalic and atraumatic.  Cardiovascular:     Rate and Rhythm: Normal rate and regular rhythm.     Pulses: Normal pulses.     Heart sounds: Normal heart sounds.  Pulmonary:     Effort: No respiratory distress.     Breath sounds: No stridor. No wheezing or rales.  Abdominal:     General: Bowel sounds are normal. There is no distension.     Palpations: Abdomen is soft.     Tenderness: There is no abdominal tenderness. There is no guarding.  Musculoskeletal:        General: Swelling present.     Comments: 1+ pitting odema  Neurological:     Mental Status: He is alert. Mental status is at baseline.     Motor: No weakness.     Last CBC Lab Results  Component Value Date   WBC 8.3 10/17/2023   HGB 14.4 10/17/2023   HCT 44.3 10/17/2023   MCV 96.5 10/17/2023   MCH 31.4 10/17/2023   RDW 15.2 10/17/2023   PLT 240 10/17/2023   Last metabolic panel Lab Results  Component Value Date   GLUCOSE 92 10/17/2023   NA 140 10/17/2023   K 4.0 10/17/2023   CL 106 10/17/2023   CO2 26 10/17/2023   BUN 18 10/17/2023   CREATININE 0.88 10/17/2023   GFRNONAA >60 10/17/2023   CALCIUM 9.8 10/17/2023   PHOS 3.2 07/29/2023   PROT 7.2 10/17/2023   ALBUMIN 4.1 10/17/2023   BILITOT 1.2 10/17/2023   ALKPHOS 77 10/17/2023   AST 17 10/17/2023   ALT 17 10/17/2023   ANIONGAP 8 10/17/2023   Last lipids No results found for: CHOL, HDL, LDLCALC, LDLDIRECT, TRIG, CHOLHDL Last hemoglobin  A1c Lab Results  Component Value Date   HGBA1C 6.0 (  H) 07/27/2023   Last thyroid  functions Lab Results  Component Value Date   TSH 1.709 07/27/2023   Last vitamin D No results found for: 25OHVITD2, 25OHVITD3, VD25OH Last vitamin B12 and Folate No results found for: VITAMINB12, FOLATE     Assessment & Plan:   Assessment & Plan Primary hypertension BP high  Denies dizziness, nausea, vomiting, double vision, chest pain  Will start toprol   Cont with losartan   Instructed patient to take his meds consistently  Avoid salty foods Stop nsaids  Follow up in a week  Instructed patient to go to ED if experiences severe headache, vomiting Orders:   metoprolol  succinate (TOPROL -XL) 25 MG 24 hr tablet; Take 1 tablet (25 mg total) by mouth daily.  Other cirrhosis of liver (HCC) Denies abdominal pain, nausea, vomiting Will refer to GI Orders:   Ambulatory referral to Gastroenterology  Benign prostatic hyperplasia with urinary frequency  Orders:   Ambulatory referral to Urology   tamsulosin  (FLOMAX ) 0.4 MG CAPS capsule; Take 1 capsule (0.4 mg total) by mouth daily.  Lower extremity edema Chronic Use compression stocking Avoid salty foods    Primary insomnia Discussed sleep hygiene     Return in about 1-2 weeks (around 03/13/2024).   Jackalyn Blazing, MD [facility]   60 min Total time spent for obtaining history,  performing a medically appropriate examination and evaluation, reviewing the tests,ordering  tests,  documenting clinical information in the electronic or other health record, independently interpreting results ,care coordination (not separately reported)

## 2024-02-14 NOTE — Assessment & Plan Note (Addendum)
 Chronic Use compression stocking Avoid salty foods

## 2024-02-14 NOTE — Assessment & Plan Note (Addendum)
 BP high  Denies dizziness, nausea, vomiting, double vision, chest pain  Will start toprol   Cont with losartan   Instructed patient to take his meds consistently  Avoid salty foods Stop nsaids  Follow up in a week  Instructed patient to go to ED if experiences severe headache, vomiting Orders:   metoprolol  succinate (TOPROL -XL) 25 MG 24 hr tablet; Take 1 tablet (25 mg total) by mouth daily.

## 2024-02-27 ENCOUNTER — Ambulatory Visit

## 2024-02-27 ENCOUNTER — Encounter: Payer: Self-pay | Admitting: Sports Medicine

## 2024-02-27 ENCOUNTER — Ambulatory Visit: Admitting: Sports Medicine

## 2024-02-27 VITALS — BP 157/97 | HR 89 | Temp 98.0°F | Wt 269.4 lb

## 2024-02-27 DIAGNOSIS — E66813 Obesity, class 3: Secondary | ICD-10-CM

## 2024-02-27 DIAGNOSIS — J302 Other seasonal allergic rhinitis: Secondary | ICD-10-CM | POA: Diagnosis not present

## 2024-02-27 DIAGNOSIS — I10 Essential (primary) hypertension: Secondary | ICD-10-CM

## 2024-02-27 DIAGNOSIS — Z13220 Encounter for screening for lipoid disorders: Secondary | ICD-10-CM

## 2024-02-27 DIAGNOSIS — R519 Headache, unspecified: Secondary | ICD-10-CM | POA: Diagnosis not present

## 2024-02-27 DIAGNOSIS — Z6841 Body Mass Index (BMI) 40.0 and over, adult: Secondary | ICD-10-CM

## 2024-02-27 DIAGNOSIS — Z23 Encounter for immunization: Secondary | ICD-10-CM | POA: Diagnosis not present

## 2024-02-27 DIAGNOSIS — Z113 Encounter for screening for infections with a predominantly sexual mode of transmission: Secondary | ICD-10-CM

## 2024-02-27 LAB — COMPREHENSIVE METABOLIC PANEL WITH GFR
ALT: 14 U/L (ref 0–53)
AST: 13 U/L (ref 0–37)
Albumin: 4.3 g/dL (ref 3.5–5.2)
Alkaline Phosphatase: 87 U/L (ref 39–117)
BUN: 18 mg/dL (ref 6–23)
CO2: 31 meq/L (ref 19–32)
Calcium: 10.1 mg/dL (ref 8.4–10.5)
Chloride: 105 meq/L (ref 96–112)
Creatinine, Ser: 0.83 mg/dL (ref 0.40–1.50)
GFR: 88.24 mL/min (ref >60.00)
Glucose, Bld: 87 mg/dL (ref 70–99)
Potassium: 4.2 meq/L (ref 3.5–5.1)
Sodium: 145 meq/L (ref 135–145)
Total Bilirubin: 1.1 mg/dL (ref 0.2–1.2)
Total Protein: 6.6 g/dL (ref 6.0–8.3)

## 2024-02-27 LAB — CBC WITH DIFFERENTIAL/PLATELET
Basophils Absolute: 0.1 K/uL (ref 0.0–0.1)
Basophils Relative: 1.1 % (ref 0.0–3.0)
Eosinophils Absolute: 0.2 K/uL (ref 0.0–0.7)
Eosinophils Relative: 2.4 % (ref 0.0–5.0)
HCT: 46.1 % (ref 39.0–52.0)
Hemoglobin: 15 g/dL (ref 13.0–17.0)
Lymphocytes Relative: 16.1 % (ref 12.0–46.0)
Lymphs Abs: 1.1 K/uL (ref 0.7–4.0)
MCHC: 32.6 g/dL (ref 30.0–36.0)
MCV: 92 fl (ref 78.0–100.0)
Monocytes Absolute: 0.6 K/uL (ref 0.1–1.0)
Monocytes Relative: 9.3 % (ref 3.0–12.0)
Neutro Abs: 4.8 K/uL (ref 1.4–7.7)
Neutrophils Relative %: 71.1 % (ref 43.0–77.0)
Platelets: 216 K/uL (ref 150.0–400.0)
RBC: 5.01 Mil/uL (ref 4.22–5.81)
RDW: 16.5 % — ABNORMAL HIGH (ref 11.5–15.5)
WBC: 6.7 K/uL (ref 4.0–10.5)

## 2024-02-27 MED ORDER — LOSARTAN POTASSIUM 100 MG PO TABS: 100.0000 mg | ORAL_TABLET | Freq: Every day | ORAL | 0 refills | Status: AC

## 2024-02-27 NOTE — Assessment & Plan Note (Addendum)
 Bp still high  Avoid salty foods Exercise regularly  He has h/o OSA , does not want to use CPAP machine ECHO - Left ventricular ejection fraction, by estimation, is 55 to 60% . The left ventricle has normal function. The left ventricle has no regional wall motion abnormalities. There is moderate left ventricular hypertrophy. Left ventricular diastolic parameters were normal. The average left ventricular global longitudinal strain is - 18. 8 % . The global longitudinal strain is normal.  Will increase losartan  to 100 mg daily Cont with toprol   Follow up in 2 weeks  Orders:   CBC w/Diff   Comp Met (CMET)   losartan  (COZAAR ) 100 MG tablet; Take 1 tablet (100 mg total) by mouth daily.

## 2024-02-27 NOTE — Progress Notes (Signed)
 Careteam: Patient Care Team: Sherlynn Madden, MD as PCP - General (Internal Medicine)  Allergies  Allergen Reactions   Beta-Lactamase Inhibitors (Beta-Lactam) Hives, Shortness Of Breath and Swelling    Amoxicillin, ciprofloxacin, penicillin   Spironolactone  Other (See Comments)    Gynecomastia     No chief complaint on file.   Discussed the use of AI scribe software for clinical note transcription with the patient, who gave verbal consent to proceed.  History of Present Illness    [name] is a 73 year old male with hypertension who presents for follow-up of elevated blood pressure and chronic headaches.  His blood pressure has been elevated, with recent readings as high as 185/95 mmHg, and systolic pressure reaching 190 mmHg earlier this week. He takes Toprol  25 mg and losartan  every night without missing doses. Despite adherence to medication, he consumes a diet high in salty and fried foods.  He experiences headaches almost daily, starting in the occipital region and becoming severe enough to limit movement. These headaches have persisted for years, occurring about four times a week, and can last an entire day. He sometimes takes three to four [facility] powders a day for relief. No vision changes, nausea, or vomiting are associated with the headaches.  He mentions sinus congestion and drainage, sometimes treated with nasal sprays, though not used daily. He experiences a runny nose and post-nasal drip, especially when lying down, and has not been taking allergy medications regularly.  He has a history of non-alcoholic cirrhosis of the liver and has been referred to a gastroenterologist, though he has not yet been contacted for an appointment. He also has sleep apnea and was advised to use a CPAP machine, but discontinued its use after a few days.  He does not engage in regular physical exercise and reports shortness of breath with exertion. No chest pain or  significant breathing difficulties at rest. He has been told that he snores and has not experienced any episodes of apnea during sleep.  Review of Systems:  Review of Systems  Constitutional:  Negative for chills and fever.  HENT:  Negative for congestion and sore throat.   Respiratory:  Negative for cough, sputum production and shortness of breath.   Cardiovascular:  Negative for chest pain, palpitations and leg swelling.  Gastrointestinal:  Negative for abdominal pain, heartburn and nausea.  Genitourinary:  Negative for dysuria, frequency and hematuria.  Musculoskeletal:  Negative for falls and myalgias.  Neurological:  Positive for headaches. Negative for dizziness, sensory change and focal weakness.   Negative unless indicated in HPI.   Patient Active Problem List   Diagnosis Date Noted   Allergic rhinitis 02/13/2024   Dietary vitamin B12 deficiency anemia 02/13/2024   Hypertensive chronic kidney disease with stage 1 through stage 4 chronic kidney disease, or unspecified chronic kidney disease 02/13/2024   Primary dysthymia late onset 02/13/2024   Moderate recurrent major depression (HCC) 02/13/2024   Sensorineural hearing loss, bilateral 02/13/2024   Vitreous degeneration, left eye 02/13/2024   Unspecified cervical disc disorder at C5-C6 level 02/13/2024   Lower extremity edema 07/28/2023   Chronic low back pain 07/27/2023   Shortness of breath 07/27/2023   Orthopnea 07/27/2023   Respiratory distress 07/27/2023   Liver cirrhosis (HCC) 07/27/2023   Morbid obesity (HCC) 07/18/2022   Prediabetes 07/18/2022   S/P revision of total knee, 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/2022   Nasal septal deviation 03/13/2022   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   Loosening of prosthesis of right total  knee replacement 04/25/2021   Failed total knee, 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   Migraine 09/10/2020   Sleep apnea 09/10/2020   Benign prostatic hyperplasia 09/10/2020   Cobalamin deficiency 09/10/2020   Hypertension  11/02/2019   Depression 11/02/2019   Thrombocytopenia 11/02/2019   Acute respiratory failure with hypoxia (HCC) 11/02/2019   Elevated troponin 11/02/2019   Hypokalemia 11/02/2019   Closed fracture of distal phalanx of finger of right hand with mallet deformity 10/05/2017   Past Medical History:  Diagnosis Date   Anemia    Arthritis    Depression 11/02/2019   Hypertension    Liver cirrhosis secondary to NASH [facility])    Pre-diabetes    Past Surgical History:  Procedure Laterality Date   CARPAL TUNNEL RELEASE Bilateral 03/28/2003   CHOLECYSTECTOMY     laparoscopic   COLON SURGERY  03/27/2001   diverticulitis   COLON SURGERY     JOINT REPLACEMENT Bilateral 03/27/2010   knees   NASAL SEPTOPLASTY W/ TURBINOPLASTY Bilateral 07/03/2022   Procedure: NASAL SEPTOPLASTY WITH TURBINATE REDUCTION;  Surgeon: Jesus Oliphant, MD;  Location: [facility];  Service: ENT;  Laterality: Bilateral;   POSTERIOR LUMBAR FUSION 2 WITH HARDWARE REMOVAL Right 10/25/2023   Procedure: ARTHROSCOPY, SHOULDER WITH DEBRIDEMENT;  Surgeon: Dozier Soulier, MD;  [facility];  Service: Orthopedics;  Laterality: Right;   SHOULDER ARTHROSCOPY Left 06/25/2015   Procedure: ARTHROSCOPY SHOULDER;  Surgeon: Norleen Gavel, MD;  Location: [facility];  Service: Orthopedics;  Laterality: Left;  Left shoulder arthroscopy with subacromial decompression and distal clavicle resection.    SHOULDER ARTHROSCOPY WITH ROTATOR CUFF REPAIR Right 10/25/2023   Procedure: ARTHROSCOPY, SHOULDER, WITH ROTATOR CUFF REPAIR;  Surgeon: Dozier Soulier, MD;  [facility];  Service: Orthopedics;  Laterality: Right;   SHOULDER OPEN ROTATOR CUFF REPAIR Left 06/25/2015   Procedure: ROTATOR CUFF REPAIR SHOULDER OPEN;  Surgeon: Norleen Gavel, MD;  Location: [facility];  Service: Orthopedics;  Laterality: Left;   SUBACROMIAL DECOMPRESSION  10/25/2023   Procedure: DECOMPRESSION, SUBACROMIAL SPACE;  Surgeon:  Dozier Soulier, MD;  [facility];  Service: Orthopedics;;   TOTAL KNEE REVISION Right 04/25/2021   Procedure: KNEE REVISION two componet quadriceps tendon repair;  Surgeon: Gavel Norleen, MD;  [facility];  Service: Orthopedics;  Laterality: Right;   TOTAL KNEE REVISION Right 07/17/2022   Procedure: RIGHT TOTAL KNEE REVISION;  Surgeon: Liam Lerner, MD;  [facility];  Service: Orthopedics;  Laterality: Right;   TRIGGER FINGER RELEASE Right    Social History   Tobacco Use   Smoking status: Never   Smokeless tobacco: Never  Vaping Use   Vaping status: Never Used  Substance Use Topics   Alcohol use: Yes    Comment: occas.   Drug use: No   Family History  Problem Relation Age of Onset   Cancer Father    Allergies  Allergen Reactions   Beta-Lactamase Inhibitors (Beta-Lactam) Hives, Shortness Of Breath and Swelling    Amoxicillin, ciprofloxacin, penicillin   Spironolactone  Other (See Comments)    Gynecomastia     Medications: Patient's Medications  New Prescriptions   No medications on file  Previous Medications   ASPIRIN  EC 81 MG TABLET    Take 1 tablet (81 mg total) by mouth 2 (two) times daily.   LOSARTAN  (COZAAR ) 50 MG TABLET    Take 2 tablets (100 mg total) by mouth daily.   MELATONIN 10 MG CHEW    Chew by mouth.   METOPROLOL  SUCCINATE (TOPROL -XL)  25 MG 24 HR TABLET    Take 1 tablet (25 mg total) by mouth daily.   TAMSULOSIN  (FLOMAX ) 0.4 MG CAPS CAPSULE    Take 1 capsule (0.4 mg total) by mouth daily.   TIZANIDINE  (ZANAFLEX ) 4 MG TABLET    Take 1 tablet (4 mg total) by mouth every 8 (eight) hours as needed for muscle spasms.   TRAZODONE  (DESYREL ) 100 MG TABLET    Take 100 mg by mouth at bedtime.  Modified Medications   No medications on file  Discontinued Medications   No medications on file    Physical Exam: There were no vitals filed for this visit. There is no height or weight on file to calculate BMI. BP Readings from Last 3 Encounters:   02/14/24 (!) 180/90  10/25/23 (!) 166/84  10/17/23 (!) 160/92   Wt Readings from Last 3 Encounters:  02/14/24 270 lb (122.5 kg)  10/25/23 261 lb 14.5 oz (118.8 kg)  10/17/23 262 lb (118.8 kg)    Physical Exam Constitutional:      Appearance: Normal appearance.  HENT:     Head: Normocephalic and atraumatic.  Cardiovascular:     Rate and Rhythm: Normal rate and regular rhythm.     Pulses: Normal pulses.     Heart sounds: Normal heart sounds.  Pulmonary:     Effort: No respiratory distress.     Breath sounds: No stridor. No wheezing or rales.  Abdominal:     General: Bowel sounds are normal. There is no distension.     Palpations: Abdomen is soft.     Tenderness: There is no abdominal tenderness. There is no guarding.  Musculoskeletal:        General: No swelling.  Neurological:     Mental Status: He is alert. Mental status is at baseline.     Motor: No weakness.     Comments: Gait normal EOMI Finger nose neg  Heel to sheen neg  Strength and sensations intact     Labs reviewed: Basic Metabolic Panel: Recent Labs    07/27/23 1138 07/28/23 0705 07/29/23 0603 07/29/23 1426 10/17/23 1450  NA  --  139 143 142 140  K  --  3.7 3.0* 3.5 4.0  CL  --  101 101 100 106  CO2  --  30 31 32 26  GLUCOSE  --  141* 96 138* 92  BUN  --  26* 29* 28* 18  CREATININE  --  0.95 0.92 0.88 0.88  CALCIUM   --  8.8* 8.9 9.0 9.8  MG  --  2.1 2.0  --   --   PHOS  --  4.5 3.7 3.2  --   TSH 1.709  --   --   --   --    Liver Function Tests: Recent Labs    07/27/23 0958 07/28/23 0705 07/29/23 0603 07/29/23 1426 10/17/23 1450  AST 61* 33  --   --  17  ALT 91* 77*  --   --  17  ALKPHOS 59 72  --   --  77  BILITOT 0.7 0.8  --   --  1.2  PROT 5.6* 6.0*  --   --  7.2  ALBUMIN 2.6* 2.7* 2.5* 2.8* 4.1   Recent Labs    07/27/23 0958  LIPASE 22   No results for input(s): AMMONIA in the last 8760 hours. CBC: Recent Labs    07/27/23 0958 07/28/23 0705 07/29/23 0603  10/17/23 1450  WBC 15.7* 16.7* 16.1*  8.3  NEUTROABS 10.9*  --   --   --   HGB 11.5* 11.9* 11.5* 14.4  HCT 36.7* 38.0* 35.2* 44.3  MCV 96.6 95.2 93.6 96.5  PLT 398 413* 413* 240   Lipid Panel: No results for input(s): CHOL, HDL, LDLCALC, TRIG, CHOLHDL, LDLDIRECT in the last 8760 hours. TSH: Recent Labs    07/27/23 1138  TSH 1.709   A1C: Lab Results  Component Value Date   HGBA1C 6.0 (H) 07/27/2023    Assessment & Plan Primary hypertension Bp still high  Avoid salty foods Exercise regularly  He has h/o OSA , does not want to use CPAP machine ECHO - Left ventricular ejection fraction, by estimation, is 55 to 60% . The left ventricle has normal function. The left ventricle has no regional wall motion abnormalities. There is moderate left ventricular hypertrophy. Left ventricular diastolic parameters were normal. The average left ventricular global longitudinal strain is - 18. 8 % . The global longitudinal strain is normal.  Will increase losartan  to 100 mg daily Cont with toprol   Follow up in 2 weeks  Orders:   CBC w/Diff   Comp Met (CMET)   losartan  (COZAAR ) 100 MG tablet; Take 1 tablet (100 mg total) by mouth daily.  Seasonal allergies Take claritin, flonase    Occipital headache C/o chronic occipital headache Neuro exam unremarkable Will refer to neurology Orders:   Ambulatory referral to Neurology   CBC w/Diff   Comp Met (CMET)  Class 3 severe obesity without serious comorbidity with body mass index (BMI) of 40.0 to 44.9 in adult, unspecified obesity type (HCC)  Orders:   Lipid panel  Screening for lipid disorders  Orders:   Lipid panel  Screening for STD (sexually transmitted disease)  Orders:   Hepatitis C Antibody   PCV today   No follow-ups on file.: 2 weeks   Yang Rack

## 2024-02-29 ENCOUNTER — Ambulatory Visit: Payer: Self-pay | Admitting: Sports Medicine

## 2024-02-29 LAB — LIPID PANEL
Cholesterol: 179 mg/dL (ref <200)
HDL: 44 mg/dL (ref >=40)
LDL Cholesterol (Calc): 115 mg/dL — ABNORMAL HIGH
Non-HDL Cholesterol (Calc): 135 mg/dL — ABNORMAL HIGH (ref <130)
Total CHOL/HDL Ratio: 4.1 (calc) (ref <5.0)
Triglycerides: 94 mg/dL (ref <150)

## 2024-02-29 LAB — HEPATITIS C ANTIBODY: Hepatitis C Ab: NONREACTIVE

## 2024-02-29 MED ORDER — ATORVASTATIN CALCIUM 40 MG PO TABS: 40.0000 mg | ORAL_TABLET | Freq: Every day | ORAL | 0 refills | Status: AC

## 2024-02-29 NOTE — Telephone Encounter (Signed)
 The 10-year ASCVD risk score (Arnett DK, et al., 2019) is: 30.5%   Values used to calculate the score:     Age: 73 years     Clincally relevant sex: Male     Is Non-Hispanic African American: No     Diabetic: No     Tobacco smoker: No     Systolic Blood Pressure: 157 mmHg     Is BP treated: Yes     HDL Cholesterol: 44 mg/dL     Total Cholesterol: 179 mg/dL   Will start lipitor

## 2024-03-13 ENCOUNTER — Ambulatory Visit: Admitting: Sports Medicine

## 2024-03-13 ENCOUNTER — Encounter: Payer: Self-pay | Admitting: Sports Medicine

## 2024-03-13 VITALS — BP 166/84 | HR 74 | Temp 98.7°F | Wt 277.0 lb

## 2024-03-13 DIAGNOSIS — F5101 Primary insomnia: Secondary | ICD-10-CM

## 2024-03-13 DIAGNOSIS — E785 Hyperlipidemia, unspecified: Secondary | ICD-10-CM | POA: Diagnosis not present

## 2024-03-13 DIAGNOSIS — I10 Essential (primary) hypertension: Secondary | ICD-10-CM | POA: Diagnosis not present

## 2024-03-13 DIAGNOSIS — G4733 Obstructive sleep apnea (adult) (pediatric): Secondary | ICD-10-CM

## 2024-03-13 LAB — BASIC METABOLIC PANEL WITH GFR
BUN: 17 mg/dL (ref 6–23)
CO2: 33 meq/L — ABNORMAL HIGH (ref 19–32)
Calcium: 10.1 mg/dL (ref 8.4–10.5)
Chloride: 104 meq/L (ref 96–112)
Creatinine, Ser: 0.9 mg/dL (ref 0.40–1.50)
GFR: 86.09 mL/min (ref >60.00)
Glucose, Bld: 101 mg/dL — ABNORMAL HIGH (ref 70–99)
Potassium: 4.4 meq/L (ref 3.5–5.1)
Sodium: 144 meq/L (ref 135–145)

## 2024-03-13 MED ORDER — METOPROLOL SUCCINATE ER 50 MG PO TB24: 50.0000 mg | ORAL_TABLET | Freq: Every day | ORAL | 3 refills | Status: AC

## 2024-03-13 NOTE — Patient Instructions (Signed)

## 2024-03-13 NOTE — Assessment & Plan Note (Addendum)
 BP still high  Will increase toprol  to 50 mg  Cont with losartan  Monitor bp daily and keep a log Avoid salty foods Exercise regularly  Discussed DASH diet Orders:   metoprolol  succinate (TOPROL -XL) 50 MG 24 hr tablet; Take 1 tablet (50 mg total) by mouth daily. Take with or immediately following a meal.   Basic Metabolic Panel (BMET)

## 2024-03-13 NOTE — Progress Notes (Signed)
 Careteam: Patient Care Team: Sherlynn Madden, MD as PCP - General (Internal Medicine)  Allergies[1]  Chief Complaint  Patient presents with   Follow-up    4 week follow up on BP. He has still been having elevated readings.    Discussed the use of AI scribe software for clinical note transcription with the patient, who gave verbal consent to proceed.  History of Present Illness    [name] is a 73 year old male with hypertension who presents with uncontrolled blood pressure.  His blood pressure remains elevated despite medication adjustments. He is currently taking losartan  100 mg and metoprolol  25 mg daily, but his blood pressure readings have been as high as 191/97 mmHg and 168/80 mmHg. He takes his medications at night and has made some dietary changes to reduce salt intake. Not stated exercising yet.  He experiences persistent headaches located at the back of his neck, which have not changed in intensity or frequency. No tingling, numbness, or weakness in his hands.   Regarding sleep, he uses a CPAP machine but has not been using it recently. He feels tired upon waking and sleeps approximately five hours per night. He occasionally takes naps during the day and drinks one to two cups of coffee daily, sometimes in the evening.  He is currently taking Lipitor at night for cholesterol management and trazodone  for sleep. He has stopped taking Flomax  for prostate issues and does not use Zanaflex  regularly.  No chest pain, shortness of breath, dizziness, lightheadedness, urinary symptoms, or blood in urine or stool. He does not smoke cigarettes.  Review of Systems:  Review of Systems  Constitutional:  Negative for chills and fever.  HENT:  Negative for congestion and sore throat.   Respiratory:  Negative for cough, sputum production and shortness of breath.   Cardiovascular:  Negative for chest pain, palpitations and leg swelling.  Gastrointestinal:  Negative for  abdominal pain, heartburn and nausea.  Genitourinary:  Negative for dysuria, frequency and hematuria.  Musculoskeletal:  Negative for falls and myalgias.  Neurological:  Negative for dizziness, sensory change and focal weakness.  Psychiatric/Behavioral:  Negative for depression.    Negative unless indicated in HPI.   Patient Active Problem List   Diagnosis Date Noted   Allergic rhinitis 02/13/2024   Dietary vitamin B12 deficiency anemia 02/13/2024   Hypertensive chronic kidney disease with stage 1 through stage 4 chronic kidney disease, or unspecified chronic kidney disease 02/13/2024   Primary dysthymia late onset 02/13/2024   Moderate recurrent major depression (HCC) 02/13/2024   Sensorineural hearing loss, bilateral 02/13/2024   Vitreous degeneration, left eye 02/13/2024   Unspecified cervical disc disorder at C5-C6 level 02/13/2024   Lower extremity edema 07/28/2023   Chronic low back pain 07/27/2023   Shortness of breath 07/27/2023   Orthopnea 07/27/2023   Respiratory distress 07/27/2023   Liver cirrhosis (HCC) 07/27/2023   Morbid obesity (HCC) 07/18/2022   Prediabetes 07/18/2022   S/P revision of total knee, right 07/17/2022   Nasal septal deviation 03/13/2022   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 04/25/2021   Failed total knee, 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   Migraine 09/10/2020   Sleep apnea 09/10/2020   Benign prostatic hyperplasia 09/10/2020   Cobalamin deficiency 09/10/2020   Hypertension 11/02/2019   Depression 11/02/2019   Thrombocytopenia 11/02/2019   Acute respiratory failure with hypoxia (HCC) 11/02/2019   Elevated troponin 11/02/2019   Hypokalemia 11/02/2019   Closed fracture of distal phalanx  of finger of right hand with mallet deformity 10/05/2017   Past Medical History:  Diagnosis Date   Anemia    Arthritis    Depression 11/02/2019   Hypertension    Liver cirrhosis secondary to NASH [facility])     Pre-diabetes    Past Surgical History:  Procedure Laterality Date   CARPAL TUNNEL RELEASE Bilateral 03/28/2003   CHOLECYSTECTOMY     laparoscopic   COLON SURGERY  03/27/2001   diverticulitis   COLON SURGERY     JOINT REPLACEMENT Bilateral 03/27/2010   knees   NASAL SEPTOPLASTY W/ TURBINOPLASTY Bilateral 07/03/2022   Procedure: NASAL SEPTOPLASTY WITH TURBINATE REDUCTION;  Surgeon: Jesus Oliphant, MD;  Location: [facility];  Service: ENT;  Laterality: Bilateral;   POSTERIOR LUMBAR FUSION 2 WITH HARDWARE REMOVAL Right 10/25/2023   Procedure: ARTHROSCOPY, SHOULDER WITH DEBRIDEMENT;  Surgeon: Dozier Soulier, MD;  Location: [facility];  Service: Orthopedics;  Laterality: Right;   SHOULDER ARTHROSCOPY Left 06/25/2015   Procedure: ARTHROSCOPY SHOULDER;  Surgeon: Norleen Gavel, MD;  Location: [facility];  Service: Orthopedics;  Laterality: Left;  Left shoulder arthroscopy with subacromial decompression and distal clavicle resection.    SHOULDER ARTHROSCOPY WITH ROTATOR CUFF REPAIR Right 10/25/2023   Procedure: ARTHROSCOPY, SHOULDER, WITH ROTATOR CUFF REPAIR;  Surgeon: Dozier Soulier, MD;  Location: [facility];  Service: Orthopedics;  Laterality: Right;   SHOULDER OPEN ROTATOR CUFF REPAIR Left 06/25/2015   Procedure: ROTATOR CUFF REPAIR SHOULDER OPEN;  Surgeon: Norleen Gavel, MD;  Location:  SURGERY CENTER;  Service: Orthopedics;  Laterality: Left;   SUBACROMIAL DECOMPRESSION  10/25/2023   Procedure: DECOMPRESSION, SUBACROMIAL SPACE;  Surgeon: Dozier Soulier, MD;  Location: [facility];  Service: Orthopedics;;   TOTAL KNEE REVISION Right 04/25/2021   Procedure: KNEE REVISION two componet quadriceps tendon repair;  Surgeon: Gavel Norleen, MD;  Location: [facility];  Service: Orthopedics;  Laterality: Right;   TOTAL KNEE REVISION Right 07/17/2022   Procedure: RIGHT TOTAL KNEE REVISION;  Surgeon: Liam Lerner, MD;  Location: [facility];  Service: Orthopedics;  Laterality: Right;    TRIGGER FINGER RELEASE Right    Social History[2] Family History  Problem Relation Age of Onset   Cancer Father    Allergies[3]  Medications: Patient's Medications  New Prescriptions   METOPROLOL  SUCCINATE (TOPROL -XL) 50 MG 24 HR TABLET    Take 1 tablet (50 mg total) by mouth daily. Take with or immediately following a meal.  Previous Medications   ASPIRIN  EC 81 MG TABLET    Take 1 tablet (81 mg total) by mouth 2 (two) times daily.   ATORVASTATIN  (LIPITOR) 40 MG TABLET    Take 1 tablet (40 mg total) by mouth daily.   LOSARTAN  (COZAAR ) 100 MG TABLET    Take 1 tablet (100 mg total) by mouth daily.   MELATONIN 10 MG CHEW    Chew by mouth.   TIZANIDINE  (ZANAFLEX ) 4 MG TABLET    Take 1 tablet (4 mg total) by mouth every 8 (eight) hours as needed for muscle spasms.   TRAZODONE  (DESYREL ) 100 MG TABLET    Take 100 mg by mouth at bedtime.  Modified Medications   No medications on file  Discontinued Medications   METOPROLOL  SUCCINATE (TOPROL -XL) 25 MG 24 HR TABLET    Take 1 tablet (25 mg total) by mouth daily.   TAMSULOSIN  (FLOMAX ) 0.4 MG CAPS CAPSULE    Take 1 capsule (0.4 mg total) by mouth daily.    Physical Exam: Vitals:   03/13/24  9050 03/13/24 1008  BP: (!) 167/102 (!) 166/84  Pulse: 74   Temp: 98.7 F (37.1 C)   TempSrc: Oral   SpO2: 96%   Weight: 277 lb (125.6 kg)    Body mass index is 44.71 kg/m. BP Readings from Last 3 Encounters:  03/13/24 (!) 166/84  02/27/24 (!) 157/97  02/14/24 (!) 180/90   Wt Readings from Last 3 Encounters:  03/13/24 277 lb (125.6 kg)  02/27/24 269 lb 6.4 oz (122.2 kg)  02/14/24 270 lb (122.5 kg)    Physical Exam Constitutional:      Appearance: Normal appearance.  HENT:     Head: Normocephalic and atraumatic.  Cardiovascular:     Rate and Rhythm: Normal rate and regular rhythm.     Pulses: Normal pulses.     Heart sounds: Normal heart sounds.  Pulmonary:     Effort: No respiratory distress.     Breath sounds: No stridor. No  wheezing or rales.  Abdominal:     General: Bowel sounds are normal. There is no distension.     Palpations: Abdomen is soft.     Tenderness: There is no abdominal tenderness. There is no guarding.  Musculoskeletal:        General: No swelling.  Neurological:     Mental Status: He is alert. Mental status is at baseline.     Sensory: No sensory deficit.     Motor: No weakness.     Labs reviewed: Basic Metabolic Panel: Recent Labs    07/27/23 1138 07/28/23 0705 07/29/23 0603 07/29/23 1426 10/17/23 1450 02/27/24 1044  NA  --  139 143 142 140 145  K  --  3.7 3.0* 3.5 4.0 4.2  CL  --  101 101 100 106 105  CO2  --  30 31 32 26 31  GLUCOSE  --  141* 96 138* 92 87  BUN  --  26* 29* 28* 18 18  CREATININE  --  0.95 0.92 0.88 0.88 0.83  CALCIUM   --  8.8* 8.9 9.0 9.8 10.1  MG  --  2.1 2.0  --   --   --   PHOS  --  4.5 3.7 3.2  --   --   TSH 1.709  --   --   --   --   --    Liver Function Tests: Recent Labs    07/28/23 0705 07/29/23 0603 07/29/23 1426 10/17/23 1450 02/27/24 1044  AST 33  --   --  17 13  ALT 77*  --   --  17 14  ALKPHOS 72  --   --  77 87  BILITOT 0.8  --   --  1.2 1.1  PROT 6.0*  --   --  7.2 6.6  ALBUMIN 2.7*   < > 2.8* 4.1 4.3   < > = values in this interval not displayed.   Recent Labs    07/27/23 0958  LIPASE 22   No results for input(s): AMMONIA in the last 8760 hours. CBC: Recent Labs    07/27/23 0958 07/28/23 0705 07/29/23 0603 10/17/23 1450 02/27/24 1044  WBC 15.7*   < > 16.1* 8.3 6.7  NEUTROABS 10.9*  --   --   --  4.8  HGB 11.5*   < > 11.5* 14.4 15.0  HCT 36.7*   < > 35.2* 44.3 46.1  MCV 96.6   < > 93.6 96.5 92.0  PLT 398   < > 413* 240 216.0   < > =  values in this interval not displayed.   Lipid Panel: Recent Labs    02/27/24 1044  CHOL 179  HDL 44  LDLCALC 115*  TRIG 94  CHOLHDL 4.1   TSH: Recent Labs    07/27/23 1138  TSH 1.709   A1C: Lab Results  Component Value Date   HGBA1C 6.0 (H) 07/27/2023     Assessment & Plan Primary hypertension BP still high  Will increase toprol  to 50 mg  Cont with losartan  Monitor bp daily and keep a log Avoid salty foods Exercise regularly  Discussed DASH diet Orders:   metoprolol  succinate (TOPROL -XL) 50 MG 24 hr tablet; Take 1 tablet (50 mg total) by mouth daily. Take with or immediately following a meal.   Basic Metabolic Panel (BMET)  Hyperlipidemia, unspecified hyperlipidemia type Tolerating  Cont with lipitor    Primary insomnia Discussed sleep hygiene Avoid day time naps Cut down coffee    OSA (obstructive sleep apnea) Pt does not want to use CPAP  Informed patient about the adverse effects of not using CPAP     Return in about 6 weeks (around 04/24/2024).:   [name]     [1]  Allergies Allergen Reactions   Beta-Lactamase Inhibitors (Beta-Lactam) Hives, Shortness Of Breath and Swelling    Amoxicillin, ciprofloxacin, penicillin   Spironolactone  Other (See Comments)    Gynecomastia   [2]  Social History Tobacco Use   Smoking status: Never   Smokeless tobacco: Never  Vaping Use   Vaping status: Never Used  Substance Use Topics   Alcohol use: Yes    Comment: occas.   Drug use: No  [3]  Allergies Allergen Reactions   Beta-Lactamase Inhibitors (Beta-Lactam) Hives, Shortness Of Breath and Swelling    Amoxicillin, ciprofloxacin, penicillin   Spironolactone  Other (See Comments)    Gynecomastia

## 2024-03-23 NOTE — Progress Notes (Deleted)
 "   Chief Complaint: No chief complaint on file.   History of Present Illness:  [name] is a 73 y.o. male who is seen in consultation from Sherlynn Madden, MD for evaluation of ***.   Past Medical History:  Past Medical History:  Diagnosis Date   Anemia    Arthritis    Depression 11/02/2019   Hypertension    Liver cirrhosis secondary to NASH [facility])    Pre-diabetes     Past Surgical History:  Past Surgical History:  Procedure Laterality Date   CARPAL TUNNEL RELEASE Bilateral 03/28/2003   CHOLECYSTECTOMY     laparoscopic   COLON SURGERY  03/27/2001   diverticulitis   COLON SURGERY     JOINT REPLACEMENT Bilateral 03/27/2010   knees   NASAL SEPTOPLASTY W/ TURBINOPLASTY Bilateral 07/03/2022   Procedure: NASAL SEPTOPLASTY WITH TURBINATE REDUCTION;  Surgeon: Jesus Oliphant, MD;  Location: [facility];  Service: ENT;  Laterality: Bilateral;   POSTERIOR LUMBAR FUSION 2 WITH HARDWARE REMOVAL Right 10/25/2023   Procedure: ARTHROSCOPY, SHOULDER WITH DEBRIDEMENT;  Surgeon: Dozier Soulier, MD;  Location[facility];  Service: Orthopedics;  Laterality: Right;   SHOULDER ARTHROSCOPY Left 06/25/2015   Procedure: ARTHROSCOPY SHOULDER;  Surgeon: Norleen Gavel, MD;  Location: [facility];  Service: Orthopedics;  Laterality: Left;  Left shoulder arthroscopy with subacromial decompression and distal clavicle resection.    SHOULDER ARTHROSCOPY WITH ROTATOR CUFF REPAIR Right 10/25/2023   Procedure: ARTHROSCOPY, SHOULDER, WITH ROTATOR CUFF REPAIR;  Surgeon: Dozier Soulier, MD;  Location[facility];  Service: Orthopedics;  Laterality: Right;   SHOULDER OPEN ROTATOR CUFF REPAIR Left 06/25/2015   Procedure: ROTATOR CUFF REPAIR SHOULDER OPEN;  Surgeon: Norleen Gavel, MD;  Location: [facility];  Service: Orthopedics;  Laterality: Left;   SUBACROMIAL DECOMPRESSION  10/25/2023   Procedure: DECOMPRESSION, SUBACROMIAL SPACE;  Surgeon: Dozier Soulier, MD;   Location: [facility];  Service: Orthopedics;;   TOTAL KNEE REVISION Right 04/25/2021   Procedure: KNEE REVISION two componet quadriceps tendon repair;  Surgeon: Gavel Norleen, MD;  Location: [facility];  Service: Orthopedics;  Laterality: Right;   TOTAL KNEE REVISION Right 07/17/2022   Procedure: RIGHT TOTAL KNEE REVISION;  Surgeon: Liam Lerner, MD;  Location: [facility];  Service: Orthopedics;  Laterality: Right;   TRIGGER FINGER RELEASE Right     Allergies:  Allergies[1]  Family History:  Family History  Problem Relation Age of Onset   Cancer Father     Social History:  Social History[2]  Review of symptoms:  Constitutional:  Negative for unexplained weight loss, night sweats, fever, chills ENT:  Negative for nose bleeds, sinus pain, painful swallowing CV:  Negative for chest pain, shortness of breath, exercise intolerance, palpitations, loss of consciousness Resp:  Negative for cough, wheezing, shortness of breath GI:  Negative for nausea, vomiting, diarrhea, bloody stools GU:  Positives noted in HPI; otherwise negative for gross hematuria, dysuria, urinary incontinence Neuro:  Negative for seizures, poor balance, limb weakness, slurred speech Psych:  Negative for lack of energy, depression, anxiety Endocrine:  Negative for polydipsia, polyuria, symptoms of hypoglycemia (dizziness, hunger, sweating) Hematologic:  Negative for anemia, purpura, petechia, prolonged or excessive bleeding, use of anticoagulants  Allergic:  Negative for difficulty breathing or choking as a result of exposure to anything; no shellfish allergy; no allergic response (rash/itch) to materials, foods  Physical exam: There were no vitals taken for this visit. GENERAL APPEARANCE:  Well appearing, well developed, well nourished, NAD HEENT: Atraumatic, Normocephalic. NECK: Normal  appearance LUNGS: Normal inspiratory and expiratory excursion HEART: Regular Rate ABDOMEN: ***. GU: Phallus normal, no lesions. Scrotal  skin normal. Testicles/epididymal structures normal. Meatus normal. Normal anal sphincter tone, prostate ***mL, symmetric, non nodular, non tender. EXTREMITIES: Moves all extremities well.  Without clubbing, cyanosis, or edema. NEUROLOGIC:  Alert and oriented x 3, normal gait, CN II-XII grossly intact.  MENTAL STATUS:  Appropriate. SKIN:  Warm, dry and intact.    Results: No results found for this or any previous visit (from the past 24 hours).  I have reviewed referring/prior physicians notes  I have reviewed urinalysis  I have reviewed PSA results  I have reviewed prior imaging   Assessment: ***   Plan: ***     [1]  Allergies Allergen Reactions   Beta-Lactamase Inhibitors (Beta-Lactam) Hives, Shortness Of Breath and Swelling    Amoxicillin, ciprofloxacin, penicillin   Spironolactone  Other (See Comments)    Gynecomastia   [2]  Social History Tobacco Use   Smoking status: Never   Smokeless tobacco: Never  Vaping Use   Vaping status: Never Used  Substance Use Topics   Alcohol use: Yes    Comment: occas.   Drug use: No   "

## 2024-03-24 ENCOUNTER — Ambulatory Visit: Admitting: Urology

## 2024-03-24 DIAGNOSIS — R350 Frequency of micturition: Secondary | ICD-10-CM

## 2024-03-24 DIAGNOSIS — N138 Other obstructive and reflux uropathy: Secondary | ICD-10-CM

## 2024-04-01 ENCOUNTER — Encounter (HOSPITAL_COMMUNITY): Payer: Self-pay | Admitting: Orthopedic Surgery

## 2024-04-22 ENCOUNTER — Ambulatory Visit: Admitting: Sports Medicine

## 2024-05-12 ENCOUNTER — Ambulatory Visit: Admitting: Urology

## 2024-05-23 ENCOUNTER — Ambulatory Visit: Admitting: Diagnostic Neuroimaging
